# Patient Record
Sex: Female | Born: 1960
Health system: Southern US, Community
[De-identification: ages and names within clinical notes are randomized; demographics above are authoritative.]

## PROBLEM LIST (undated history)

## (undated) DIAGNOSIS — M329 Systemic lupus erythematosus, unspecified: Secondary | ICD-10-CM

## (undated) DIAGNOSIS — F419 Anxiety disorder, unspecified: Secondary | ICD-10-CM

## (undated) DIAGNOSIS — Z789 Other specified health status: Secondary | ICD-10-CM

## (undated) DIAGNOSIS — G629 Polyneuropathy, unspecified: Secondary | ICD-10-CM

## (undated) DIAGNOSIS — F410 Panic disorder [episodic paroxysmal anxiety] without agoraphobia: Secondary | ICD-10-CM

## (undated) DIAGNOSIS — Z8489 Family history of other specified conditions: Secondary | ICD-10-CM

## (undated) DIAGNOSIS — R0600 Dyspnea, unspecified: Secondary | ICD-10-CM

## (undated) DIAGNOSIS — F411 Generalized anxiety disorder: Secondary | ICD-10-CM

## (undated) DIAGNOSIS — N840 Polyp of corpus uteri: Secondary | ICD-10-CM

## (undated) DIAGNOSIS — G4733 Obstructive sleep apnea (adult) (pediatric): Secondary | ICD-10-CM

## (undated) DIAGNOSIS — C3490 Malignant neoplasm of unspecified part of unspecified bronchus or lung: Secondary | ICD-10-CM

## (undated) DIAGNOSIS — I10 Essential (primary) hypertension: Secondary | ICD-10-CM

## (undated) DIAGNOSIS — I499 Cardiac arrhythmia, unspecified: Secondary | ICD-10-CM

## (undated) DIAGNOSIS — I4510 Unspecified right bundle-branch block: Secondary | ICD-10-CM

## (undated) DIAGNOSIS — Z9989 Dependence on other enabling machines and devices: Secondary | ICD-10-CM

## (undated) DIAGNOSIS — M797 Fibromyalgia: Secondary | ICD-10-CM

## (undated) DIAGNOSIS — Z8659 Personal history of other mental and behavioral disorders: Secondary | ICD-10-CM

## (undated) DIAGNOSIS — M3500 Sicca syndrome, unspecified: Secondary | ICD-10-CM

## (undated) HISTORY — DX: Panic disorder (episodic paroxysmal anxiety): F41.0

## (undated) HISTORY — PX: LUNG SURGERY: SHX703

## (undated) HISTORY — DX: Anxiety disorder, unspecified: F41.9

## (undated) HISTORY — DX: Essential (primary) hypertension: I10

## (undated) HISTORY — DX: Sjogren syndrome, unspecified: M35.00

## (undated) HISTORY — PX: MOUTH SURGERY: SHX715

## (undated) HISTORY — DX: Malignant neoplasm of unspecified part of unspecified bronchus or lung: C34.90

## (undated) HISTORY — DX: Systemic lupus erythematosus, unspecified: M32.9

## (undated) HISTORY — PX: OTHER SURGICAL HISTORY: SHX169

---

## 2012-10-09 HISTORY — PX: COLONOSCOPY WITH PROPOFOL: SHX5780

## 2012-10-21 LAB — HM COLONOSCOPY

## 2017-07-20 ENCOUNTER — Encounter: Payer: Self-pay | Admitting: Obstetrics & Gynecology

## 2017-07-20 ENCOUNTER — Ambulatory Visit (INDEPENDENT_AMBULATORY_CARE_PROVIDER_SITE_OTHER): Payer: 59 | Admitting: Obstetrics & Gynecology

## 2017-07-20 VITALS — BP 130/80 | Ht 64.0 in | Wt 197.0 lb

## 2017-07-20 DIAGNOSIS — Z1151 Encounter for screening for human papillomavirus (HPV): Secondary | ICD-10-CM | POA: Diagnosis not present

## 2017-07-20 DIAGNOSIS — L930 Discoid lupus erythematosus: Secondary | ICD-10-CM | POA: Diagnosis not present

## 2017-07-20 DIAGNOSIS — M3500 Sicca syndrome, unspecified: Secondary | ICD-10-CM

## 2017-07-20 DIAGNOSIS — F419 Anxiety disorder, unspecified: Secondary | ICD-10-CM | POA: Diagnosis not present

## 2017-07-20 DIAGNOSIS — N951 Menopausal and female climacteric states: Secondary | ICD-10-CM | POA: Diagnosis not present

## 2017-07-20 DIAGNOSIS — Z01419 Encounter for gynecological examination (general) (routine) without abnormal findings: Secondary | ICD-10-CM

## 2017-07-20 MED ORDER — ALPRAZOLAM 0.25 MG PO TABS: 0.2500 mg | ORAL_TABLET | Freq: Two times a day (BID) | ORAL | 3 refills | Status: DC | PRN

## 2017-07-20 MED ORDER — PROGESTERONE MICRONIZED 100 MG PO CAPS: 100.0000 mg | ORAL_CAPSULE | Freq: Every day | ORAL | 4 refills | Status: DC

## 2017-07-20 NOTE — Progress Notes (Signed)
Patricia Mathews 1961/10/25 [id number]   History:    56 y.o. G2P2 Married.  Husband is a Engineer, drilling.  From [place].  Speaks both [place] and [place].  RP:  New patient presenting for annual gyn exam   HPI:  Menopause x about 7 years.  No HRT.  No PMB.  No pelvic pain.  Has hot flashes and night sweats associated with panic attacks.  Followed for Sjogren syndrome and Lupus.  On cymbalta and Lisinopril.  Breasts wnl.  Mictions/BMs wnl.  Past medical history,surgical history, family history and social history were all reviewed and documented in the EPIC chart.  Gynecologic History No LMP recorded. Patient is postmenopausal. Contraception: post menopausal status Last Pap: 2016. Results were: normal Last mammogram: 2016. Results were: Normal Colonoscopy 2 yrs ago. Bone Density to schedule.  Obstetric History OB History  Gravida Para Term Preterm AB Living  2 2       2   SAB TAB Ectopic Multiple Live Births               # Outcome Date GA Lbr Len/2nd Weight Sex Delivery Anes PTL Lv  2 Para           1 Para                ROS: A ROS was performed and pertinent positives and negatives are included in the history.  GENERAL: No fevers or chills. HEENT: No change in vision, no earache, sore throat or sinus congestion. NECK: No pain or stiffness. CARDIOVASCULAR: No chest pain or pressure. No palpitations. PULMONARY: No shortness of breath, cough or wheeze. GASTROINTESTINAL: No abdominal pain, nausea, vomiting or diarrhea, melena or bright red blood per rectum. GENITOURINARY: No urinary frequency, urgency, hesitancy or dysuria. MUSCULOSKELETAL: No joint or muscle pain, no back pain, no recent trauma. DERMATOLOGIC: No rash, no itching, no lesions. ENDOCRINE: No polyuria, polydipsia, no heat or cold intolerance. No recent change in weight. HEMATOLOGICAL: No anemia or easy bruising or bleeding. NEUROLOGIC: No headache, seizures, numbness, tingling or weakness. PSYCHIATRIC: No depression, no  loss of interest in normal activity or change in sleep pattern.     Exam:   BP 130/80   Ht 5\' 4"  (1.626 m)   Wt 197 lb (89.4 kg)   BMI 33.81 kg/m   Body mass index is 33.81 kg/m.  General appearance : Well developed well nourished female. No acute distress HEENT: Eyes: no retinal hemorrhage or exudates,  Neck supple, trachea midline, no carotid bruits, no thyroidmegaly Lungs: Clear to auscultation, no rhonchi or wheezes, or rib retractions  Heart: Regular rate and rhythm, no murmurs or gallops Breast:Examined in sitting and supine position were symmetrical in appearance, no palpable masses or tenderness,  no skin retraction, no nipple inversion, no nipple discharge, no skin discoloration, no axillary or supraclavicular lymphadenopathy Abdomen: no palpable masses or tenderness, no rebound or guarding Extremities: no edema or skin discoloration or tenderness  Pelvic: Vulva normal  Bartholin, Urethra, Skene Glands: Within normal limits             Vagina: No gross lesions or discharge  Cervix: No gross lesions or discharge.  Pap/HPV done.  Uterus  AV, normal size, shape and consistency, non-tender and mobile  Adnexa  Without masses or tenderness  Anus and perineum  normal     Assessment/Plan:  56 y.o. female for annual exam   1. Encounter for routine gynecological examination with Papanicolaou smear of cervix Normal gyn exam.  Pap/HPV done.  Breasts wnl.  Will schedule screening Mammo. - PAP,TP IMGw/HPV RNA,rflx [id number],62/83  2. Menopause syndrome No HRT.  No PMB.  C/O worsened anxiety associated with Hot Flushes and Night Sweats.  Given Lupus and Sjogren's as well as Menopausal status x many years without HRT, decision not to start Estrogen at this point because of the significant risk of Strokes.  Different management options reviewed.  Decision to try Prometrium 100 mg PO HS.  Usage reviewed.  3. Anxiety Counseling done.  Offered referral to Psychotherapist.  Prescribed  Xanax as needed, recommend to use sparingly.  Importance of regular Physical activity to improve anxiety by releasing Endorphins discussed.  4. Lupus erythematosus, unspecified form BP 130/80 on Medication.  5. Sjogren's syndrome, with unspecified organ involvement ([place])   6. Special screening examination for human papillomavirus (HPV)  - PAP,TP IMGw/HPV RNA,rflx [id number],65/03  Counseling on above issues >50% x 20 minutes.  Princess Bruins MD, 11:24 AM 07/20/2017

## 2017-07-21 MED ORDER — PROGESTERONE MICRONIZED 100 MG PO CAPS: 100.0000 mg | ORAL_CAPSULE | Freq: Every day | ORAL | 4 refills | Status: DC

## 2017-07-21 MED ORDER — DULOXETINE HCL 30 MG PO CPEP: 30.0000 mg | ORAL_CAPSULE | Freq: Every day | ORAL | 0 refills | Status: DC

## 2017-07-21 MED FILL — DULoxetine HCL 30 MG CPEP: 30 | 30 days supply | Qty: 30 | Fill #0

## 2017-07-22 LAB — PAP, TP IMAGING W/ HPV RNA, RFLX HPV TYPE 16,18/45: HPV DNA High Risk: NOT DETECTED

## 2017-07-22 MED ORDER — ALPRAZOLAM 0.25 MG PO TABS: 0.2500 mg | ORAL_TABLET | Freq: Two times a day (BID) | ORAL | 3 refills | Status: DC | PRN

## 2017-07-22 MED FILL — PROGESTERONE 100 MG CAPSULE: 100 | 90 days supply | Qty: 90 | Fill #0

## 2017-07-22 MED FILL — ALPRAZolam 0.25 MG TABS: 0.25 | 15 days supply | Qty: 30 | Fill #0

## 2017-07-22 NOTE — Patient Instructions (Signed)
1. Encounter for routine gynecological examination with Papanicolaou smear of cervix Normal gyn exam.  Pap/HPV done.  Breasts wnl.  Will schedule screening Mammo. - PAP,TP IMGw/HPV RNA,rflx [id number],85/88  2. Menopause syndrome No HRT.  No PMB.  C/O worsened anxiety associated with Hot Flushes and Night Sweats.  Given Lupus and Sjogren's as well as Menopausal status x many years without HRT, decision not to start Estrogen at this point because of the significant risk of Strokes.  Different management options reviewed.  Decision to try Prometrium 100 mg PO HS.  Usage reviewed.  3. Anxiety Counseling done.  Offered referral to Psychotherapist.  Prescribed Xanax as needed, recommend to use sparingly.  Importance of regular Physical activity to improve anxiety by releasing Endorphins discussed.  4. Lupus erythematosus, unspecified form BP 130/80 on Medication.  5. Sjogren's syndrome, with unspecified organ involvement (Patricia Mathews)   6. Special screening examination for human papillomavirus (HPV)  - PAP,TP IMGw/HPV RNA,rflx [id number],78/67  Patricia Mathews, it was a pleasure to meet you today!  I will inform you of your results as soon as available.   Health Maintenance for Postmenopausal Women Menopause is a normal process in which your reproductive ability comes to an end. This process happens gradually over a span of months to years, usually between the ages of 74 and 31. Menopause is complete when you have missed 12 consecutive menstrual periods. It is important to talk with your health care provider about some of the most common conditions that affect postmenopausal women, such as heart disease, cancer, and bone loss (osteoporosis). Adopting a healthy lifestyle and getting preventive care can help to promote your health and wellness. Those actions can also lower your chances of developing some of these common conditions. What should I know about menopause? During menopause, you may experience a number  of symptoms, such as:  Moderate-to-severe hot flashes.  Night sweats.  Decrease in sex drive.  Mood swings.  Headaches.  Tiredness.  Irritability.  Memory problems.  Insomnia.  Choosing to treat or not to treat menopausal changes is an individual decision that you make with your health care provider. What should I know about hormone replacement therapy and supplements? Hormone therapy products are effective for treating symptoms that are associated with menopause, such as hot flashes and night sweats. Hormone replacement carries certain risks, especially as you become older. If you are thinking about using estrogen or estrogen with progestin treatments, discuss the benefits and risks with your health care provider. What should I know about heart disease and stroke? Heart disease, heart attack, and stroke become more likely as you age. This may be due, in part, to the hormonal changes that your body experiences during menopause. These can affect how your body processes dietary fats, triglycerides, and cholesterol. Heart attack and stroke are both medical emergencies. There are many things that you can do to help prevent heart disease and stroke:  Have your blood pressure checked at least every 1-2 years. High blood pressure causes heart disease and increases the risk of stroke.  If you are 56-41 years old, ask your health care provider if you should take aspirin to prevent a heart attack or a stroke.  Do not use any tobacco products, including cigarettes, chewing tobacco, or electronic cigarettes. If you need help quitting, ask your health care provider.  It is important to eat a healthy diet and maintain a healthy weight. ? Be sure to include plenty of vegetables, fruits, low-fat dairy products, and lean protein. ? Avoid  eating foods that are high in solid fats, added sugars, or salt (sodium).  Get regular exercise. This is one of the most important things that you can do for  your health. ? Try to exercise for at least 150 minutes each week. The type of exercise that you do should increase your heart rate and make you sweat. This is known as moderate-intensity exercise. ? Try to do strengthening exercises at least twice each week. Do these in addition to the moderate-intensity exercise.  Know your numbers.Ask your health care provider to check your cholesterol and your blood glucose. Continue to have your blood tested as directed by your health care provider.  What should I know about cancer screening? There are several types of cancer. Take the following steps to reduce your risk and to catch any cancer development as early as possible. Breast Cancer  Practice breast self-awareness. ? This means understanding how your breasts normally appear and feel. ? It also means doing regular breast self-exams. Let your health care provider know about any changes, no matter how small.  If you are 26 or older, have a clinician do a breast exam (clinical breast exam or CBE) every year. Depending on your age, family history, and medical history, it may be recommended that you also have a yearly breast X-ray (mammogram).  If you have a family history of breast cancer, talk with your health care provider about genetic screening.  If you are at high risk for breast cancer, talk with your health care provider about having an MRI and a mammogram every year.  Breast cancer (BRCA) gene test is recommended for women who have family members with BRCA-related cancers. Results of the assessment will determine the need for genetic counseling and BRCA1 and for BRCA2 testing. BRCA-related cancers include these types: ? Breast. This occurs in males or females. ? Ovarian. ? Tubal. This may also be called fallopian tube cancer. ? Cancer of the abdominal or pelvic lining (peritoneal cancer). ? Prostate. ? Pancreatic.  Cervical, Uterine, and Ovarian Cancer Your health care provider may  recommend that you be screened regularly for cancer of the pelvic organs. These include your ovaries, uterus, and vagina. This screening involves a pelvic exam, which includes checking for microscopic changes to the surface of your cervix (Pap test).  For women ages 21-65, health care providers may recommend a pelvic exam and a Pap test every three years. For women ages 58-65, they may recommend the Pap test and pelvic exam, combined with testing for human papilloma virus (HPV), every five years. Some types of HPV increase your risk of cervical cancer. Testing for HPV may also be done on women of any age who have unclear Pap test results.  Other health care providers may not recommend any screening for nonpregnant women who are considered low risk for pelvic cancer and have no symptoms. Ask your health care provider if a screening pelvic exam is right for you.  If you have had past treatment for cervical cancer or a condition that could lead to cancer, you need Pap tests and screening for cancer for at least 20 years after your treatment. If Pap tests have been discontinued for you, your risk factors (such as having a new sexual partner) need to be reassessed to determine if you should start having screenings again. Some women have medical problems that increase the chance of getting cervical cancer. In these cases, your health care provider may recommend that you have screening and Pap tests  more often.  If you have a family history of uterine cancer or ovarian cancer, talk with your health care provider about genetic screening.  If you have vaginal bleeding after reaching menopause, tell your health care provider.  There are currently no reliable tests available to screen for ovarian cancer.  Lung Cancer Lung cancer screening is recommended for adults 41-71 years old who are at high risk for lung cancer because of a history of smoking. A yearly low-dose CT scan of the lungs is recommended if  you:  Currently smoke.  Have a history of at least 30 pack-years of smoking and you currently smoke or have quit within the past 15 years. A pack-year is smoking an average of one pack of cigarettes per day for one year.  Yearly screening should:  Continue until it has been 15 years since you quit.  Stop if you develop a health problem that would prevent you from having lung cancer treatment.  Colorectal Cancer  This type of cancer can be detected and can often be prevented.  Routine colorectal cancer screening usually begins at age 53 and continues through age 20.  If you have risk factors for colon cancer, your health care provider may recommend that you be screened at an earlier age.  If you have a family history of colorectal cancer, talk with your health care provider about genetic screening.  Your health care provider may also recommend using home test kits to check for hidden blood in your stool.  A small camera at the end of a tube can be used to examine your colon directly (sigmoidoscopy or colonoscopy). This is done to check for the earliest forms of colorectal cancer.  Direct examination of the colon should be repeated every 5-10 years until age 6. However, if early forms of precancerous polyps or small growths are found or if you have a family history or genetic risk for colorectal cancer, you may need to be screened more often.  Skin Cancer  Check your skin from head to toe regularly.  Monitor any moles. Be sure to tell your health care provider: ? About any new moles or changes in moles, especially if there is a change in a mole's shape or color. ? If you have a mole that is larger than the size of a pencil eraser.  If any of your family members has a history of skin cancer, especially at a young age, talk with your health care provider about genetic screening.  Always use sunscreen. Apply sunscreen liberally and repeatedly throughout the day.  Whenever you are  outside, protect yourself by wearing long sleeves, pants, a wide-brimmed hat, and sunglasses.  What should I know about osteoporosis? Osteoporosis is a condition in which bone destruction happens more quickly than new bone creation. After menopause, you may be at an increased risk for osteoporosis. To help prevent osteoporosis or the bone fractures that can happen because of osteoporosis, the following is recommended:  If you are 41-20 years old, get at least 1,000 mg of calcium and at least 600 mg of vitamin D per day.  If you are older than age 74 but younger than age 9, get at least 1,200 mg of calcium and at least 600 mg of vitamin D per day.  If you are older than age 41, get at least 1,200 mg of calcium and at least 800 mg of vitamin D per day.  Smoking and excessive alcohol intake increase the risk of osteoporosis. Eat foods  that are rich in calcium and vitamin D, and do weight-bearing exercises several times each week as directed by your health care provider. What should I know about how menopause affects my mental health? Depression may occur at any age, but it is more common as you become older. Common symptoms of depression include:  Low or sad mood.  Changes in sleep patterns.  Changes in appetite or eating patterns.  Feeling an overall lack of motivation or enjoyment of activities that you previously enjoyed.  Frequent crying spells.  Talk with your health care provider if you think that you are experiencing depression. What should I know about immunizations? It is important that you get and maintain your immunizations. These include:  Tetanus, diphtheria, and pertussis (Tdap) booster vaccine.  Influenza every year before the flu season begins.  Pneumonia vaccine.  Shingles vaccine.  Your health care provider may also recommend other immunizations. This information is not intended to replace advice given to you by your health care provider. Make sure you discuss  any questions you have with your health care provider. Document Released: 12/18/2005 Document Revised: 05/15/2016 Document Reviewed: 07/30/2015 Elsevier Interactive Patient Education  2018 [employer].

## 2017-07-30 ENCOUNTER — Telehealth: Payer: Self-pay | Admitting: *Deleted

## 2017-07-30 NOTE — Telephone Encounter (Signed)
Appointment 08/16/17 @ 9:45am with Arlyss Gandy at [facility] Dermatology notes faxed to [phone]. Left detailed message on pt voicemail per DPR access. , told her to call me to confirmed she receive my message.

## 2017-07-30 NOTE — Telephone Encounter (Signed)
-----   Message from Princess Bruins, MD sent at 07/20/2017 12:04 PM EDT ----- Regarding: Refer to Dermato 2 skin lesions and general skin exam.  Refer to Dermato.

## 2017-08-16 DIAGNOSIS — L720 Epidermal cyst: Secondary | ICD-10-CM | POA: Diagnosis not present

## 2017-08-16 DIAGNOSIS — D1800 Hemangioma unspecified site: Secondary | ICD-10-CM | POA: Diagnosis not present

## 2017-08-16 DIAGNOSIS — D229 Melanocytic nevi, unspecified: Secondary | ICD-10-CM | POA: Diagnosis not present

## 2017-09-16 MED FILL — ALPRAZolam 0.25 MG TABS: 0.25 | 15 days supply | Qty: 30 | Fill #1

## 2017-09-29 ENCOUNTER — Other Ambulatory Visit: Payer: Self-pay | Admitting: Emergency Medicine

## 2017-09-29 DIAGNOSIS — Z1231 Encounter for screening mammogram for malignant neoplasm of breast: Secondary | ICD-10-CM

## 2017-10-27 ENCOUNTER — Ambulatory Visit: Payer: Self-pay

## 2017-10-27 ENCOUNTER — Ambulatory Visit
Admission: RE | Admit: 2017-10-27 | Discharge: 2017-10-27 | Disposition: A | Discharge status: Home / Self Care | Payer: 59 | Source: Ambulatory Visit | Attending: Emergency Medicine | Admitting: Emergency Medicine

## 2017-10-27 DIAGNOSIS — Z1231 Encounter for screening mammogram for malignant neoplasm of breast: Secondary | ICD-10-CM

## 2017-11-11 MED FILL — ALPRAZolam 0.25 MG TABS: 0.25 | 15 days supply | Qty: 30 | Fill #2

## 2017-12-01 DIAGNOSIS — M3500 Sicca syndrome, unspecified: Secondary | ICD-10-CM | POA: Diagnosis not present

## 2017-12-01 DIAGNOSIS — M5432 Sciatica, left side: Secondary | ICD-10-CM | POA: Diagnosis not present

## 2017-12-01 DIAGNOSIS — Z6832 Body mass index (BMI) 32.0-32.9, adult: Secondary | ICD-10-CM | POA: Diagnosis not present

## 2017-12-01 DIAGNOSIS — R202 Paresthesia of skin: Secondary | ICD-10-CM | POA: Diagnosis not present

## 2017-12-01 DIAGNOSIS — M797 Fibromyalgia: Secondary | ICD-10-CM | POA: Diagnosis not present

## 2017-12-01 DIAGNOSIS — E669 Obesity, unspecified: Secondary | ICD-10-CM | POA: Diagnosis not present

## 2017-12-01 DIAGNOSIS — R5383 Other fatigue: Secondary | ICD-10-CM | POA: Diagnosis not present

## 2017-12-02 ENCOUNTER — Encounter: Payer: Self-pay | Admitting: Emergency Medicine

## 2017-12-08 ENCOUNTER — Encounter: Payer: Self-pay | Admitting: Neurology

## 2017-12-09 ENCOUNTER — Telehealth: Payer: Self-pay | Admitting: Neurology

## 2017-12-09 NOTE — Telephone Encounter (Signed)
Patricia Mathews, I am going to open up February 15th. Would you call patient and see if she would interested in that day and let me know? Then I will open up the day and put her in that slot. thanks

## 2017-12-23 ENCOUNTER — Encounter: Payer: Self-pay | Admitting: Neurology

## 2017-12-23 ENCOUNTER — Ambulatory Visit (INDEPENDENT_AMBULATORY_CARE_PROVIDER_SITE_OTHER): Payer: 59 | Admitting: Neurology

## 2017-12-23 VITALS — BP 143/82 | HR 84 | Ht 65.0 in | Wt 192.0 lb

## 2017-12-23 DIAGNOSIS — G3281 Cerebellar ataxia in diseases classified elsewhere: Secondary | ICD-10-CM

## 2017-12-23 DIAGNOSIS — G4719 Other hypersomnia: Secondary | ICD-10-CM

## 2017-12-23 DIAGNOSIS — R531 Weakness: Secondary | ICD-10-CM

## 2017-12-23 DIAGNOSIS — G629 Polyneuropathy, unspecified: Secondary | ICD-10-CM

## 2017-12-23 DIAGNOSIS — R202 Paresthesia of skin: Secondary | ICD-10-CM | POA: Diagnosis not present

## 2017-12-23 DIAGNOSIS — R29818 Other symptoms and signs involving the nervous system: Secondary | ICD-10-CM

## 2017-12-23 DIAGNOSIS — R2990 Unspecified symptoms and signs involving the nervous system: Secondary | ICD-10-CM | POA: Diagnosis not present

## 2017-12-23 DIAGNOSIS — R519 Headache, unspecified: Secondary | ICD-10-CM

## 2017-12-23 DIAGNOSIS — R5382 Chronic fatigue, unspecified: Secondary | ICD-10-CM | POA: Diagnosis not present

## 2017-12-23 DIAGNOSIS — R42 Dizziness and giddiness: Secondary | ICD-10-CM | POA: Diagnosis not present

## 2017-12-23 DIAGNOSIS — R51 Headache: Secondary | ICD-10-CM

## 2017-12-23 NOTE — Progress Notes (Signed)
[facility]    Provider:  Dr Jaynee Eagles Referring Provider: Horald Pollen, * Primary Care Physician:  Horald Pollen, MD  CC:  Neuropathy  HPI:  Patricia Mathews is a 57 y.o. female here as a referral from Dr. Mitchel Honour for neuropathy.  Past medical history systemic lupus, Sjogren's, high blood pressure, postmenopausal symptoms, anxiety.  She has a significant family history of autoimmune disorders.  She is also obese, fibromyalgia, paresthesias, other fatigue, and sciatica of the left leg. Symptoms all started in 2010 around menopause, with hair loss, joint pain, muscle pain, malaise, fatigue,  she went to a neurologist in [place], She imaged her neuroaxis and had an emg/ncs were unremarkable. She saw Rheumatology, ANA was positive and +Sjogren's diagnosed with lupus and sjogrens but unsure may be more Fibromyalgia. She has electrical pain traveling down her spine, radiates to the arms and thighs (points to the anterior thighs) like stabbing pain, excruciating. Startes in the neck and radiates down her back, she fell to the floor and couldn't move for 45 minutes, she was on the floor. She couldn't move at all. She has headaches and vertigo with nystagmus. Lasts 60 seconds. She has a lot of vertigo, very bad headaches and stabbing pain which is horrible. There has been extensive workup over the years. The shooting pain down the spine happens weekly and is severe and lasts 10-15 minutes and radiates down the spine. No triggers. She is having vertigo more often, her eyes start "going crazy", Can occur with or without a headache. She has headaches a few times a week. Start n the left side, she has photophobia, phono/osmophobia, nausea, moving makes it worse, tramadol helps, can last 4-24 hours, naysea and vomiting. No Fhx of migraines. 18 headache days a month. Husband says she snores significantly. She has had "drop attacks" per her physician husband, she has extreme fatigue  and malaise, snores. She has vision changes, she sees little dots. She has acute severe onset occipital severe headache.  Reviewed notes, labs and imaging from outside physicians, which showed: Reviewed primary care notes and notes from [facility] rheumatology.  Patient is being treated.  CK was normal at 111, CBC normal, sed rate normal, CMP normal with BUN 10 and creatinine 0.75 labs were drawn December 03, 2017.  For systemic lupus.  She has pain and tenderness of her muscles and joints.  She has fatigue and hair thinning.  She went into menopause which made everything worse.  Fatigue was worse.  In 2012 she had an episode where she had severe pain of the spinal cord with lasted 2 days, she had normal MRIs of her brain and cervical spine, in 2013 she saw rheumatology again she was having diffuse tenderness of the muscles, stabbing pain down the legs, she was given Plaquenil which did not seem safe due to side effects.  Prednisone taper makes her migraines worse.  She decided to due to American natural things instead.  She takes a low-dose estrogen with helps with hot flashes and helps her sleep.  She also has anxiety and takes Cymbalta for her pain which helps but does not help with anxiety.  Tenderness is pretty much gone with the Cymbalta.  She has flares where she feels like she has an electricity shock pain that goes to her body and notes that this started since November however the family says this is been ongoing for the last 3 years and even before the Cymbalta.  She notes pain in her  shoulders, elbows, knees, pain down the left leg.  She notes no joint swelling.  No stiffness in the morning, does not feel worse in the morning.  She feels better when she exercises.  She notes dry eyes and mouth, uses drops only occasionally, she uses biotin mouthwash.      Review of Systems: Patient complains of symptoms per HPI as well as the following symptoms: neuropathy, joint pain, muscle pain, fatigue.  Pertinent negatives and positives per HPI. All others negative.   Social History   Socioeconomic History  . Marital status: Married    Spouse name: Not on file  . Number of children: 2  . Years of education: post-grad  . Highest education level: Not on file  Social Needs  . Financial resource strain: Not on file  . Food insecurity - worry: Not on file  . Food insecurity - inability: Not on file  . Transportation needs - medical: Not on file  . Transportation needs - non-medical: Not on file  Occupational History  . Not on file  Tobacco Use  . Smoking status: Former Smoker    Last attempt to quit: 1989    Years since quitting: 30.1  . Smokeless tobacco: Never Used  Substance and Sexual Activity  . Alcohol use: Yes    Comment: social  . Drug use: No  . Sexual activity: Yes    Partners: Male    Comment: 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45, PARTNERS- 2, MARRIED- 7 YRS   Other Topics Concern  . Not on file  Social History Narrative   Lives at home with her husband   Right handed   2 cups of caffeine daily    Family History  Problem Relation Age of Onset  . Hypertension Mother   . Osteoarthritis Mother   . Cancer Father        PROSTATE  . Hypertension Father   . Hypertension Sister   . Lupus Sister   . Cancer Brother        PROSTATE  . Lupus Brother   . Hypertension Brother   . Sarcoidosis Paternal Aunt   . Hypertension Brother   . Hypertension Brother   . Breast cancer Neg Hx     Past Medical History:  Diagnosis Date  . Anxiety   . Hypertension   . Lupus   . Panic attacks   . Sjogren's disease [facility])     Past Surgical History:  Procedure Laterality Date  . CESAREAN SECTION     X2  . MOUTH SURGERY      Current Outpatient Medications  Medication Sig Dispense Refill  . ALPRAZolam (XANAX) 0.25 MG tablet Take 1 tablet (0.25 mg total) by mouth 2 (two) times daily as needed for anxiety. 30 tablet 3  . DULoxetine (CYMBALTA) 30 MG capsule Take 1 capsule (30 mg total) by  mouth daily. (Patient taking differently: Take 30 mg by mouth 2 (two) times daily. ) 30 capsule 0  . lisinopril (PRINIVIL,ZESTRIL) 10 MG tablet Take 20 mg by mouth daily.     . progesterone (PROMETRIUM) 100 MG capsule Take 1 capsule (100 mg total) by mouth at bedtime. 90 capsule 4  . ALPRAZolam (XANAX) 0.5 MG tablet Take 1-2 tabs 30-60 minutes before MRI. May repeat if needed for anxiety during procedure. Max 63m a day. 10 tablet 0   No current facility-administered medications for this visit.     Allergies as of 12/23/2017 - Review Complete 12/23/2017  Allergen Reaction Noted  . Penicillins Anaphylaxis  07/20/2017    Vitals: BP (!) 143/82 (BP Location: Left Arm, Patient Position: Sitting) Comment: pt anxious; BP same at this AM per husband  Pulse 84   Ht _0  (1.651 m)   Wt 192 lb (87.1 kg)   BMI 31.95 kg/m  Last Weight:  Wt Readings from Last 1 Encounters:  12/23/17 192 lb (87.1 kg)   Last Height:   Ht Readings from Last 1 Encounters:  12/23/17 _1  (1.651 m)   Physical exam: Exam: Gen: NAD, conversant, well nourised, obese, well groomed                     CV: RRR, no MRG. No Carotid Bruits. No peripheral edema, warm, nontender Eyes: Conjunctivae clear without exudates or hemorrhage  Neuro: Detailed Neurologic Exam  Speech:    Speech is normal; fluent and spontaneous with normal comprehension.  Cognition:    The patient is oriented to person, place, and time;     recent and remote memory intact;     language fluent;     normal attention, concentration,     fund of knowledge Cranial Nerves:    The pupils are equal, round, and reactive to light.Attempted fundoscopic exam could not visualize.. Visual fields are full to finger confrontation. Extraocular movements are intact. Trigeminal sensation is intact and the muscles of mastication are normal. The face is symmetric. The palate elevates in the midline. Hearing intact. Voice is normal. Shoulder shrug is normal. The  tongue has normal motion without fasciculations.   Coordination:    Normal finger to nose and heel to shin. Normal rapid alternating movements.   Gait:    Heel-toe and tandem gait are normal.   Motor Observation:    No asymmetry, no atrophy, and no involuntary movements noted. Tone:    Normal muscle tone.    Posture:    Posture is normal. normal erect    Strength: weak grip otherwise strength is V/V in the upper and lower limbs.      Sensation: intact vibration, impaired pin prick and temp in the distal LE     Reflex Exam:  DTR's:    Deep tendon reflexes in the upper and lower extremities are brisk bilaterally.   Toes:    The toes are downgoing bilaterally.   Clonus:    Clonus is absent.       Assessment/Plan:  57 year old female here for evaluation of multiple neurologic symptoms. Past medical history systemic lupus, Sjogren's, high blood pressure, postmenopausal symptoms, anxiety.  She has a significant family history of autoimmune disorders.  She is also obese, fibromyalgia, paresthesias, other fatigue, and sciatica of the left leg. She has a constellation of symptoms including severe fatigue, paresthesias, electrical jolts down her spine (Lheurmitte?), excruciating radicular symptoms, weakness, headaches, vertigo, nystagmis, snoring, "drop attacks", and other signs and symptoms.   Sleep evaluation Dr. Brett Fairy MRI brain w/wo contrast and cervical spine to evaluate for multiple sclerosis given her ataxia, neuropathy, weakness, Lheurmitte's sign She has had extensive lab workup, will order several more she may not have had Then Thoracic spine if above negative  Emg/ncs 4-limb study please CTA head after above workup if unremarkable to eval for vasculitis or other vascular etiologies   Orders Placed This Encounter  Procedures  . MR BRAIN W WO CONTRAST  . MR CERVICAL SPINE W WO CONTRAST  . Hemoglobin A1c  . B12 and Folate Panel  . Methylmalonic acid, serum  . Vitamin  B6  .  Multiple Myeloma Panel (SPEP&IFE w/QIG)  . Vitamin B1  . Ambulatory referral to Sleep Studies  . NCV with EMG(electromyography)    Sarina Ill, MD  [facility] [address] [place], [place]  [address]  Phone [phone] Fax [phone]

## 2017-12-23 NOTE — Patient Instructions (Addendum)
Sleep evaluation Dr. Brett Fairy MRI brain, cervical spine and thoracic spine w/wo contrast Then may consider Emg/ncs and CTA head Consider vestibular migraines or vertigo associated with migraines Labs

## 2017-12-27 ENCOUNTER — Telehealth: Payer: Self-pay | Admitting: Neurology

## 2017-12-27 ENCOUNTER — Other Ambulatory Visit: Payer: Self-pay | Admitting: Neurology

## 2017-12-27 ENCOUNTER — Telehealth: Payer: Self-pay | Admitting: *Deleted

## 2017-12-27 DIAGNOSIS — G629 Polyneuropathy, unspecified: Secondary | ICD-10-CM | POA: Diagnosis not present

## 2017-12-27 MED ORDER — ALPRAZOLAM 0.5 MG PO TABS: ORAL_TABLET | ORAL | 0 refills | Status: DC

## 2017-12-27 MED FILL — ALPRAZolam 0.5 MG TABS: 0.5 | 2 days supply | Qty: 10 | Fill #0

## 2017-12-27 NOTE — Telephone Encounter (Signed)
Faxed requisitions to patient service center for the following labs: Vitamin B1, Multiple Myeloma Panel, Vitamin B6, MMA, Vitamin B12 & Folate Panel, and Hemoglobin A1c. Received a receipt of confirmation.

## 2017-12-27 NOTE — Telephone Encounter (Signed)
On your desk

## 2017-12-27 NOTE — Telephone Encounter (Signed)
Patricia Mathews, script left on your desk thanks

## 2017-12-27 NOTE — Telephone Encounter (Signed)
Left detailed voicemail on pharmacy's machine. Informed them of need to cancel Xanax prescription per pt request. Included pt's name and DOB and office call back number for any questions.

## 2017-12-27 NOTE — Telephone Encounter (Signed)
Pt called stating she doesn't want an Xanax called in since she will be driving to her MRI

## 2017-12-27 NOTE — Telephone Encounter (Signed)
Patient is schedule to have her MRI's done this Wednesday 12/29/17 at our [place] mobile unit. She informed me she is claustrophobic and would like something to calm her nerves.

## 2017-12-27 NOTE — Telephone Encounter (Signed)
Faxed prescription to pharmacy. Received a receipt of confirmation.

## 2017-12-27 NOTE — Telephone Encounter (Signed)
Spoke with patient. She is aware that Xanax prescription has been sent to her pharmacy with the following instructions:  Take 1-2 tabs 30-60 minutes before MRI. May repeat if needed for anxiety during procedure. Max 4 mg a day.  Patient verbalized understanding. I also advised patient not to drive while taking this medication. She verbalized understanding and stated that she has not decided if she is going to drive or not but will not take the medication if she does not drive. Advised not to fill medication if she does not need it for MRI. She agreed and had no additional questions.

## 2017-12-28 DIAGNOSIS — R2990 Unspecified symptoms and signs involving the nervous system: Secondary | ICD-10-CM | POA: Insufficient documentation

## 2017-12-29 ENCOUNTER — Other Ambulatory Visit: Payer: 59

## 2017-12-29 ENCOUNTER — Ambulatory Visit: Payer: 59

## 2017-12-29 DIAGNOSIS — G4719 Other hypersomnia: Secondary | ICD-10-CM

## 2017-12-29 DIAGNOSIS — R29818 Other symptoms and signs involving the nervous system: Secondary | ICD-10-CM

## 2017-12-29 DIAGNOSIS — R519 Headache, unspecified: Secondary | ICD-10-CM

## 2017-12-29 DIAGNOSIS — G629 Polyneuropathy, unspecified: Secondary | ICD-10-CM | POA: Diagnosis not present

## 2017-12-29 DIAGNOSIS — R51 Headache: Secondary | ICD-10-CM

## 2017-12-29 DIAGNOSIS — R42 Dizziness and giddiness: Secondary | ICD-10-CM | POA: Diagnosis not present

## 2017-12-29 DIAGNOSIS — R2990 Unspecified symptoms and signs involving the nervous system: Secondary | ICD-10-CM

## 2017-12-29 DIAGNOSIS — R5382 Chronic fatigue, unspecified: Secondary | ICD-10-CM

## 2017-12-29 DIAGNOSIS — G3281 Cerebellar ataxia in diseases classified elsewhere: Secondary | ICD-10-CM

## 2017-12-29 DIAGNOSIS — R531 Weakness: Secondary | ICD-10-CM

## 2017-12-29 DIAGNOSIS — R202 Paresthesia of skin: Secondary | ICD-10-CM | POA: Diagnosis not present

## 2017-12-29 MED ORDER — GADOPENTETATE DIMEGLUMINE 469.01 MG/ML IV SOLN: 20.0000 mL | Freq: Once | INTRAVENOUS | Status: DC | PRN

## 2017-12-30 ENCOUNTER — Encounter: Payer: Self-pay | Admitting: Emergency Medicine

## 2017-12-31 ENCOUNTER — Telehealth: Payer: Self-pay | Admitting: Neurology

## 2017-12-31 NOTE — Telephone Encounter (Signed)
-----   Message from Melvenia Beam, MD sent at 12/30/2017  5:05 PM EST ----- Normal MRI brain and cervical spine. Incidental posterior fossa arachnoid cyst (4.7 x 2.4cm on axial views).

## 2017-12-31 NOTE — Telephone Encounter (Signed)
Called and made her aware that everything was normal. Pt verbalized understanding and was appreciative for the call.

## 2018-01-03 ENCOUNTER — Encounter: Payer: Self-pay | Admitting: Neurology

## 2018-01-03 ENCOUNTER — Ambulatory Visit (INDEPENDENT_AMBULATORY_CARE_PROVIDER_SITE_OTHER): Payer: 59 | Admitting: Neurology

## 2018-01-03 VITALS — BP 125/80 | HR 76 | Ht 65.0 in | Wt 194.0 lb

## 2018-01-03 DIAGNOSIS — G4710 Hypersomnia, unspecified: Secondary | ICD-10-CM | POA: Diagnosis not present

## 2018-01-03 DIAGNOSIS — R51 Headache: Secondary | ICD-10-CM

## 2018-01-03 DIAGNOSIS — R0683 Snoring: Secondary | ICD-10-CM | POA: Diagnosis not present

## 2018-01-03 DIAGNOSIS — G4730 Sleep apnea, unspecified: Secondary | ICD-10-CM | POA: Diagnosis not present

## 2018-01-03 DIAGNOSIS — R519 Headache, unspecified: Secondary | ICD-10-CM | POA: Insufficient documentation

## 2018-01-03 DIAGNOSIS — M2550 Pain in unspecified joint: Secondary | ICD-10-CM | POA: Insufficient documentation

## 2018-01-03 NOTE — Progress Notes (Signed)
[facility]INE CL[facility]IC   Provider:  Larey Seat, M D  Primary Care Physician:  Horald Pollen, MD   Referring Provider: Horald Pollen, *   Chief Complaint  Patient presents with  . New Patient (Initial Visit)    pt alone, rm 10. pt states that she is here to rule out sleep apnea. she is told she snores in sleep.     HPI:  Patricia Mathews is a 57 y.o. female, seen here as in a referral from Dr. Jaynee Eagles for an evaluation of snoring and possible sleep apnea.  The patient sees my colleague Dr. Jaynee Eagles, who is working her up for headaches with vertigo and with established nystagmus.  She also has a stabbing pain sensation.  Is feeling pain that radiates down her spine and occurs weekly.  She has not identified triggers.  Her vertigo can also occur with and without a headache.  There is no family history of migraines.  She has noted up to 18 headache days a month.  Her husband, who is trained as an emergency room physician, has stated that she snores significantly.  She has also suffered from extreme fatigue, sleepiness, malaise and she has had drop attacks.  She was diagnosed with lupus and Sjogren's syndrome over 10 years ago while living in [place].  She moved from [place], [place] about a year ago to the [place] area with her MD husband , who is practicing as a family Loss adjuster, chartered.  She could not tolerate Plaquenil been first treated, prednisone Tapers have made her migraines worse, but she uses low-dose estrogen which helps hot flashes and helps her sleep.  She had felt some relief with Cymbalta.  She also noted dry eyes and dry mouth- but this does not necessarily feel worse in the morning.  Chief complaint according to patient :" I may have apnea".   Sleep habits are as follows: Patricia Mathews reports that she may stay up watching TV until her bedtime around 10 PM if her husband goes to work next day.  If he does not have to go to work next day may actually watch Netflix up  to 1 AM. She has no TV in the bedroom- she watches in her den.  Once in bed, she prays and goes promptly to sleep.  The patient's bedroom is cool, quiet and dark.  She has a bedroom with her husband, prefers to sleep on her right side and only uses one pillow.  The mattress is flat in a nonadjustable bed.  She usually is able to sleep through for several hours before she wakes,   often she does not even have a bathroom break at night. If she doesn't set her alarm and has not to get up in the morning she has no difficulties to sleep until 10 or 11 AM the next day.  She craves more sleep, wakes up achy but not tired.  Unfortunately she often has headaches in the morning this can be down the neck radiating sharp stabbing sensations or throbbing global headaches.  She usually is not nauseated, not dizzy.  Some headaches wake her out of sleep, sharp stabbing, migraine or cluster type.    Sleep medical history and family sleep history: no neck injury, normal cervical spine MRI. Brain MRI.  Brother with Sjoegrens, lupus and , 2 brothers and father with cancer ( prostate). Son had uveitis once while still in high school.    Social history: married, 2 adult children with grandchildren,  She has been smoking until 27 years ago, 1992, while her daughter was in preschool.  Drinks wine, or whisky sour. 1-2 a week. Caffeine : coffee in AM and noon- 6  espressi doppio ! Soda - rarely,  Some times iced tea, no energy drinks.    Review of Systems: Out of a complete 14 system review, the patient complains of only the following symptoms, and all other reviewed systems are negative.  Snoring, headaches of several qualities, including sleep related, achiness, joint pain, less muscle pain. Hypersomnia   Epworth score 9/ 24   , Fatigue severity score 26/ 63   , depression score 2/ 15    Social History   Socioeconomic History  . Marital status: Married    Spouse name: Not on file  . Number of children: 2  . Years  of education: post-grad  . Highest education level: Not on file  Social Needs  . Financial resource strain: Not on file  . Food insecurity - worry: Not on file  . Food insecurity - inability: Not on file  . Transportation needs - medical: Not on file  . Transportation needs - non-medical: Not on file  Occupational History  . Not on file  Tobacco Use  . Smoking status: Former Smoker    Last attempt to quit: 1989    Years since quitting: 30.1  . Smokeless tobacco: Never Used  Substance and Sexual Activity  . Alcohol use: Yes    Comment: social  . Drug use: No  . Sexual activity: Yes    Partners: Male    Comment: 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24, PARTNERS- 2, MARRIED- 43 YRS   Other Topics Concern  . Not on file  Social History Narrative   Lives at home with her husband   Right handed   2 cups of caffeine daily    Family History  Problem Relation Age of Onset  . Hypertension Mother   . Osteoarthritis Mother   . Cancer Father        PROSTATE  . Hypertension Father   . Hypertension Sister   . Lupus Sister   . Cancer Brother        PROSTATE  . Lupus Brother   . Hypertension Brother   . Sarcoidosis Paternal Aunt   . Hypertension Brother   . Hypertension Brother   . Breast cancer Neg Hx     Past Medical History:  Diagnosis Date  . Anxiety   . Hypertension   . Lupus   . Panic attacks   . Sjogren's disease [facility])     Past Surgical History:  Procedure Laterality Date  . CESAREAN SECTION     X2  . MOUTH SURGERY      Current Outpatient Medications  Medication Sig Dispense Refill  . ALPRAZolam (XANAX) 0.25 MG tablet Take 1 tablet (0.25 mg total) by mouth 2 (two) times daily as needed for anxiety. 30 tablet 3  . DULoxetine (CYMBALTA) 30 MG capsule Take 1 capsule (30 mg total) by mouth daily. (Patient taking differently: Take 30 mg by mouth 2 (two) times daily. ) 30 capsule 0  . lisinopril (PRINIVIL,ZESTRIL) 10 MG tablet Take 20 mg by mouth daily.     . progesterone  (PROMETRIUM) 100 MG capsule Take 1 capsule (100 mg total) by mouth at bedtime. 90 capsule 4   No current facility-administered medications for this visit.    Facility-Administered Medications Ordered in Other Visits  Medication Dose Route Frequency Provider Last Rate Last Dose  .  gadopentetate dimeglumine (MAGNEVIST) injection 20 mL  20 mL Intravenous Once PRN Melvenia Beam, MD        Allergies as of 01/03/2018 - Review Complete 01/03/2018  Allergen Reaction Noted  . Penicillins Anaphylaxis 07/20/2017    Vitals: BP 125/80   Pulse 76   Ht 5\' 5"  (1.651 m)   Wt 194 lb (88 kg)   BMI 32.28 kg/m  Last Weight:  Wt Readings from Last 1 Encounters:  01/03/18 194 lb (88 kg)   WEX:HBZJ mass index is 32.28 kg/m.     Last Height:   Ht Readings from Last 1 Encounters:  01/03/18 5\' 5"  (1.651 m)    Physical exam:  General: The patient is awake, alert and appears not in acute distress. The patient is well groomed. Head: Normocephalic, atraumatic. Neck is supple. Mallampati 3,  neck circumference:14.5 . Nasal airflow patent , a TMJ click is not evident . Retrognathia is not seen.  All biological teeth and implant.  Cardiovascular:  Regular rate and rhythm , without  murmurs or carotid bruit, and without distended neck veins. Respiratory: Lungs are clear to auscultation. Skin:  Without evidence of edema, or rash Trunk: BMI is 32. The patient's posture is erect   Neurologic exam : The patient is awake and alert, oriented to place and time.   Memory subjective described as intact.  Attention span & concentration ability appears normal.  Speech is fluent,  without dysarthria, dysphonia or aphasia.  Mood and affect are appropriate.  Cranial nerves: Pupils are equal and briskly reactive to light. Funduscopic exam without evidence of pallor or edema.  Extraocular movements  in vertical and horizontal planes intact and without nystagmus. Visual fields by finger perimetry are  intact. Hearing to finger rub intact.   Facial sensation intact to fine touch.  Facial motor strength is symmetric and tongue and uvula move midline. Shoulder shrug was symmetrical.   Motor exam:  Normal tone, muscle bulk and symmetric strength in all extremities. Sensory:  Fine touch, pinprick and vibration were tested in all extremities and  normal. Coordination: Rapid alternating movements /Finger-to-nose maneuver  normal without evidence of ataxia, dysmetria or tremor. Gait and station: Patient walks without assistive device and is able unassisted to climb up to the exam table. Strength within normal limits.Stance is stable and normal. Turns with 3  Steps. Romberg testing is negative. Deep tendon reflexes: in the  upper and lower extremities are symmetric and intact. Babinski maneuver response is  downgoing.  Assessment:  After physical and neurologic examination, review of laboratory studies,  Personal review of imaging studies, reports of other /same  Imaging studies, results of polysomnography and / or neurophysiology testing and pre-existing records as far as provided in visit., my assessment is:   1) Patricia Mathews has experienced hypersomnia for several years now.  There were some interruptions in her sleep that were attributed to menopausal syndrome, but these have much improved on the hormone replacement.  However there is a lot of pain and achiness and soreness that  also affects how well she sleeps.  And she states that once she is asleep she is trying to sleep as long as she can to escape from hurting.  To sleep on her right sides but during the night shifts to supine or left sided sleep position, and her husband has noted her to snore, and has witnessed apneas.    These may contribute to hypersomnia.  I will order an attended sleep study for the  patient with a split-night procedure.  Should she have more than 20 apneas per hour in the first 2 hours of sleep we will try to treat her  either with CPAP or if no hypoxemia is present, we may later referral for a dental device..  Patricia Mathews is familiar with CPAP, as her husband is using a machine.  And she is not apprehensive about it.  I have reviewed her rheumatology records and Dr. Cathren Laine records of previous EMG, nerve conduction study, MRI results ANA remains positive and she also tested positive for the Sjogren's, however it may be possible that she would have more restful and restorative sleep and eliminate some of the sleep related headaches by using CPAP.  I will the patient once a sleep study has been completed.  The patient was advised of the nature of the diagnosed disorder , the treatment options and the  risks for general health and wellness arising from not treating the condition.   I spent more than 45  minutes of face to face time with the patient. Greater than 50% of time was spent in counseling and coordination of care. We have discussed the diagnosis and differential and I answered the patient's questions.    Plan:  Treatment plan and additional workup :  SPLIT night PSG- AHI 20 , look at possible hypoxemia    Larey Seat, MD 12/21/2480, 50:03 AM  Certified in Neurology by ABPN Certified in [place] by [facility] [address], [place], [place] [address]

## 2018-01-06 ENCOUNTER — Ambulatory Visit (INDEPENDENT_AMBULATORY_CARE_PROVIDER_SITE_OTHER): Payer: Self-pay | Admitting: Neurology

## 2018-01-06 ENCOUNTER — Ambulatory Visit (INDEPENDENT_AMBULATORY_CARE_PROVIDER_SITE_OTHER): Payer: 59 | Admitting: Neurology

## 2018-01-06 ENCOUNTER — Telehealth: Payer: Self-pay | Admitting: Neurology

## 2018-01-06 DIAGNOSIS — G4719 Other hypersomnia: Secondary | ICD-10-CM

## 2018-01-06 DIAGNOSIS — R51 Headache: Secondary | ICD-10-CM

## 2018-01-06 DIAGNOSIS — R42 Dizziness and giddiness: Secondary | ICD-10-CM

## 2018-01-06 DIAGNOSIS — R531 Weakness: Secondary | ICD-10-CM

## 2018-01-06 DIAGNOSIS — R519 Headache, unspecified: Secondary | ICD-10-CM

## 2018-01-06 DIAGNOSIS — G3281 Cerebellar ataxia in diseases classified elsewhere: Secondary | ICD-10-CM

## 2018-01-06 DIAGNOSIS — R202 Paresthesia of skin: Secondary | ICD-10-CM

## 2018-01-06 DIAGNOSIS — E8581 Light chain (AL) amyloidosis: Secondary | ICD-10-CM

## 2018-01-06 DIAGNOSIS — R29818 Other symptoms and signs involving the nervous system: Secondary | ICD-10-CM

## 2018-01-06 DIAGNOSIS — R5382 Chronic fatigue, unspecified: Secondary | ICD-10-CM

## 2018-01-06 DIAGNOSIS — R2990 Unspecified symptoms and signs involving the nervous system: Secondary | ICD-10-CM

## 2018-01-06 DIAGNOSIS — Z0289 Encounter for other administrative examinations: Secondary | ICD-10-CM

## 2018-01-06 DIAGNOSIS — G629 Polyneuropathy, unspecified: Secondary | ICD-10-CM

## 2018-01-06 NOTE — Progress Notes (Signed)
Full Name: Patricia Mathews Gender: Female MRN #: [id number] Date of Birth: Jan 12, 2061    Visit Date: 01/06/18 09:06 Age: 57 Years 16 Months Old Examining Physician: Sarina Ill, MD  Referring Physician: Dr. Brett Fairy, MD    History:  57 year old female here for evaluation of multiple neurologic symptoms. Past medical history systemic lupus, Sjogren's,high blood pressure, postmenopausal symptoms, anxiety. She has a significant family history of autoimmune disorders. She is also obese, fibromyalgia, paresthesias, other fatigue, and sciatica of the left leg. She has a constellation of symptoms including severe fatigue, paresthesias, electrical jolts down her spine (Lheurmitte?), excruciating radicular symptoms, weakness, headaches, vertigo, nystagmis, snoring, "drop attacks", and other signs and symptoms. Extensive labwork, neuroimaging to date unrevealing. A sleep study has been ordered and pending. Evaluation today for peripheral nerve or muscle disease.   Summary: EMG/NCS was performed on the bilateral lower extremities and left upper extremity.  The left median/ulnar (palm) comparison nerve showed prolonged distal peak latency (Median Palm, 2.2 ms, N<2.2) and abnormal peak latency difference (Median Palm-Ulnar Palm, 0.5 ms, N<0.4) with a relative median delay.   All remaining nerves were within normal limits All muscles were within normal limits   Conclusion: Possible mild left Carpal Tunnel Syndrome. Otherwise this is a normal exam.  Discussed. Discussed findings with patient and husband, reviewed images and answered questions. MRI brain and cervical spine unremarkable. Discussed upcoming sleep evaluation. Also following with rheumatology. Reviewed labs with patient. Reviewed labs: She has mild B12 deficient (B12 393 and elevated MMA), also Kappa light chain spike on IFE needs referral to hematology to rule out amyloidosis. Recommend daily oral B12.  A total of 40 minutes was  spent in with this patient and husband face-to-face. Over half this time was spent on counseling patient on the b12 deficiency and abnormal IFE, reviewing images with patient and husband and different differential and diagnostic options available. This does not include time spent performing procedure. Referral to Hematology.  Sarina Ill, M.D.  [facility] [place], [place] [address] Tel: [phone] Fax: [phone]        [facility]    Nerve / Sites Muscle Latency Ref. Amplitude Ref. Rel Amp Segments Distance Velocity Ref. Area    ms ms mV mV %  cm m/s m/s mVms  L Median - APB     Wrist APB 3.4 ?4.4 5.7 ?4.0 100 Wrist - APB 7   16.6     Upper arm APB 7.0  5.6  98 Upper arm - Wrist 20 56 ?49 18.5  L Ulnar - ADM     Wrist ADM 2.4 ?3.3 6.2 ?6.0 100 Wrist - ADM 7   16.7     B.Elbow ADM 5.5  5.7  91.8 B.Elbow - Wrist 18 60 ?49 16.6     A.Elbow ADM 7.2  5.7  100 A.Elbow - B.Elbow 10 58 ?49 16.5         A.Elbow - Wrist      R Peroneal - EDB     Ankle EDB 6.3 ?6.5 3.9 ?2.0 100 Ankle - EDB 9   13.6     Fib head EDB 12.3  3.5  91 Fib head - Ankle 30 49 ?44 13.7     Pop fossa EDB 14.3  3.4  96.6 Pop fossa - Fib head 10 52 ?44 13.6         Pop fossa - Ankle      L Peroneal - EDB  Ankle EDB 6.1 ?6.5 4.7 ?2.0 100 Ankle - EDB 9   19.1     Fib head EDB 12.9  4.2  90 Fib head - Ankle 30 44 ?44 18.8     Pop fossa EDB 15.1  4.1  96.7 Pop fossa - Fib head 10 46 ?44 16.4         Pop fossa - Ankle      R Tibial - AH     Ankle AH 4.0 ?5.8 10.2 ?4.0 100 Ankle - AH 9   36.2     Pop fossa AH 11.8  9.0  88.2 Pop fossa - Ankle 36 46 ?41 38.2  L Tibial - AH     Ankle AH 4.7 ?5.8 7.5 ?4.0 100 Ankle - AH 9   27.0     Pop fossa AH 12.2  7.5  100 Pop fossa - Ankle 36 48 ?41 29.3                 SNC    Nerve / Sites Rec. Site Peak Lat Ref.  Amp Ref. Segments Distance Peak Diff Ref.    ms ms V V  cm ms ms  R Sural - Ankle (Calf)     Calf Ankle 3.0 ?4.4 13 ?6 Calf - Ankle 14     L Sural - Ankle (Calf)     Calf Ankle 3.9 ?4.4 12 ?6 Calf - Ankle 14    R Superficial peroneal - Ankle     Lat leg Ankle 3.4 ?4.4 6 ?6 Lat leg - Ankle 14    L Superficial peroneal - Ankle     Lat leg Ankle 3.6 ?4.4 6 ?6 Lat leg - Ankle 14    L Median, Ulnar - Transcarpal comparison     Median Palm Wrist 2.2 ?2.2 111 ?35 Median Palm - Wrist 8       Ulnar Palm Wrist 1.7 ?2.2 29 ?12 Ulnar Palm - Wrist 8          Median Palm - Ulnar Palm  0.5 ?0.4  R Median, Ulnar - Transcarpal comparison     Median Palm Wrist 2.1 ?2.2 50 ?35 Median Palm - Wrist 8       Ulnar Palm Wrist 1.9 ?2.2 14 ?12 Ulnar Palm - Wrist 8          Median Palm - Ulnar Palm  0.2 ?0.4  L Median - Orthodromic (Dig II, Mid palm)     Dig II Wrist 3.0 ?3.4 36 ?10 Dig II - Wrist 13    R Median - Orthodromic (Dig II, Mid palm)     Dig II Wrist 3.1 ?3.4 17 ?10 Dig II - Wrist 13    L Ulnar - Orthodromic, (Dig V, Mid palm)     Dig V Wrist 2.2 ?3.1 12 ?5 Dig V - Wrist 11    R Ulnar - Orthodromic, (Dig V, Mid palm)     Dig V Wrist 2.4 ?3.1 7 ?5 Dig V - Wrist 65                             F  Wave    Nerve F Lat Ref.   ms ms  R Tibial - AH 46.4 ?56.0  L Tibial - AH 46.1 ?56.0  L Ulnar - ADM 25.0 ?32.0           EMG full  EMG Summary Table    Spontaneous MUAP Recruitment  Muscle IA Fib PSW Fasc Other Amp Dur. Poly Pattern  L. Deltoid Normal None None None _______ Normal Normal Normal Normal  L. Triceps brachii Normal None None None _______ Normal Normal Normal Normal  L. Biceps brachii Normal None None None _______ Normal Normal Normal Normal  L. Pronator teres Normal None None None _______ Normal Normal Normal Normal  L. First dorsal interosseous Normal None None None _______ Normal Normal Normal Normal  L. Opponens pollicis Normal None None None _______ Normal Normal Normal Normal  R. Iliopsoas Normal None None None _______ Normal Normal Normal Normal  R. Vastus medialis Normal None None None _______ Normal Normal  Normal Normal  R. Tibialis anterior Normal None None None _______ Normal Normal Normal Normal  R. Gastrocnemius (Medial head) Normal None None None _______ Normal Normal Normal Normal  R. Biceps femoris (long head) Normal None None None _______ Normal Normal Normal Normal  R. Gluteus medius Normal None None None _______ Normal Normal Normal Normal  R. Lumbar paraspinals (low) Normal None None None _______ Normal Normal Normal Normal  L. Cervical paraspinals (low) Normal None None None _______ Normal Normal Normal Normal

## 2018-01-06 NOTE — Progress Notes (Signed)
See procedure note.

## 2018-01-06 NOTE — Telephone Encounter (Signed)
Called and spoke to [facility] with Dr. Beryle Beams , Jeneen Rinks she is going to speak to him and see when he can see Patient. Doris will call me back . [phone].

## 2018-01-06 NOTE — Telephone Encounter (Signed)
Doris called back and she back to Dr. Beryle Beams office and he recommends that patient be seen at the cancer center. I have called and left Seth Bake a message about scheduling [phone]

## 2018-01-06 NOTE — Procedures (Signed)
Full Name: Patricia Mathews Gender: Female MRN #: [id number] Date of Birth: 16-Jul-2061    Visit Date: 01/06/18 09:06 Age: 57 Years 20 Months Old Examining Physician: Sarina Ill, MD  Referring Physician: Dr. Brett Fairy, MD    History:  57 year old female here for evaluation of multiple neurologic symptoms. Past medical history systemic lupus, Sjogren's,high blood pressure, postmenopausal symptoms, anxiety. She has a significant family history of autoimmune disorders. She is also obese, fibromyalgia, paresthesias, other fatigue, and sciatica of the left leg. She has a constellation of symptoms including severe fatigue, paresthesias, electrical jolts down her spine (Lheurmitte?), excruciating radicular symptoms, weakness, headaches, vertigo, nystagmis, snoring, "drop attacks", and other signs and symptoms. Extensive labwork, neuroimaging to date unrevealing. A sleep study has been ordered and pending. Evaluation today for peripheral nerve or muscle disease.   Summary: EMG/NCS was performed on the bilateral lower extremities and left upper extremity.  The left median/ulnar (palm) comparison nerve showed prolonged distal peak latency (Median Palm, 2.2 ms, N<2.2) and abnormal peak latency difference (Median Palm-Ulnar Palm, 0.5 ms, N<0.4) with a relative median delay.   All remaining nerves were within normal limits All muscles were within normal limits   Conclusion: Possible mild left Carpal Tunnel Syndrome. Otherwise this is a normal exam.  Discussed. Discussed findings with patient and husband, reviewed images and answered questions. MRI brain and cervical spine unremarkable. Discussed upcoming sleep evaluation. Also following with rheumatology. Reviewed labs with patient. Reviewed labs: She has mild B12 deficient (B12 393 and elevated MMA), also Kappa light chain spike on IFE needs referral to hematology to rule out amyloidosis. Recommend daily oral B12.  A total of 40 minutes was  spent in with this patient and husband face-to-face. Over half this time was spent on counseling patient on the b12 deficiency and abnormal IFE, reviewing images with patient and husband and different differential and diagnostic options available. This does not include time spent performing procedure. Referral to Hematology.  Sarina Ill, M.D.  [facility] [place],  [address] Tel: [phone] Fax: [phone]        [facility]    Nerve / Sites Muscle Latency Ref. Amplitude Ref. Rel Amp Segments Distance Velocity Ref. Area    ms ms mV mV %  cm m/s m/s mVms  L Median - APB     Wrist APB 3.4 ?4.4 5.7 ?4.0 100 Wrist - APB 7   16.6     Upper arm APB 7.0  5.6  98 Upper arm - Wrist 20 56 ?49 18.5  L Ulnar - ADM     Wrist ADM 2.4 ?3.3 6.2 ?6.0 100 Wrist - ADM 7   16.7     B.Elbow ADM 5.5  5.7  91.8 B.Elbow - Wrist 18 60 ?49 16.6     A.Elbow ADM 7.2  5.7  100 A.Elbow - B.Elbow 10 58 ?49 16.5         A.Elbow - Wrist      R Peroneal - EDB     Ankle EDB 6.3 ?6.5 3.9 ?2.0 100 Ankle - EDB 9   13.6     Fib head EDB 12.3  3.5  91 Fib head - Ankle 30 49 ?44 13.7     Pop fossa EDB 14.3  3.4  96.6 Pop fossa - Fib head 10 52 ?44 13.6         Pop fossa - Ankle      L Peroneal - EDB  Ankle EDB 6.1 ?6.5 4.7 ?2.0 100 Ankle - EDB 9   19.1     Fib head EDB 12.9  4.2  90 Fib head - Ankle 30 44 ?44 18.8     Pop fossa EDB 15.1  4.1  96.7 Pop fossa - Fib head 10 46 ?44 16.4         Pop fossa - Ankle      R Tibial - AH     Ankle AH 4.0 ?5.8 10.2 ?4.0 100 Ankle - AH 9   36.2     Pop fossa AH 11.8  9.0  88.2 Pop fossa - Ankle 36 46 ?41 38.2  L Tibial - AH     Ankle AH 4.7 ?5.8 7.5 ?4.0 100 Ankle - AH 9   27.0     Pop fossa AH 12.2  7.5  100 Pop fossa - Ankle 36 48 ?41 29.3                 SNC    Nerve / Sites Rec. Site Peak Lat Ref.  Amp Ref. Segments Distance Peak Diff Ref.    ms ms V V  cm ms ms  R Sural - Ankle (Calf)     Calf Ankle 3.0 ?4.4 13 ?6 Calf - Ankle 14     L Sural - Ankle (Calf)     Calf Ankle 3.9 ?4.4 12 ?6 Calf - Ankle 14    R Superficial peroneal - Ankle     Lat leg Ankle 3.4 ?4.4 6 ?6 Lat leg - Ankle 14    L Superficial peroneal - Ankle     Lat leg Ankle 3.6 ?4.4 6 ?6 Lat leg - Ankle 14    L Median, Ulnar - Transcarpal comparison     Median Palm Wrist 2.2 ?2.2 111 ?35 Median Palm - Wrist 8       Ulnar Palm Wrist 1.7 ?2.2 29 ?12 Ulnar Palm - Wrist 8          Median Palm - Ulnar Palm  0.5 ?0.4  R Median, Ulnar - Transcarpal comparison     Median Palm Wrist 2.1 ?2.2 50 ?35 Median Palm - Wrist 8       Ulnar Palm Wrist 1.9 ?2.2 14 ?12 Ulnar Palm - Wrist 8          Median Palm - Ulnar Palm  0.2 ?0.4  L Median - Orthodromic (Dig II, Mid palm)     Dig II Wrist 3.0 ?3.4 36 ?10 Dig II - Wrist 13    R Median - Orthodromic (Dig II, Mid palm)     Dig II Wrist 3.1 ?3.4 17 ?10 Dig II - Wrist 13    L Ulnar - Orthodromic, (Dig V, Mid palm)     Dig V Wrist 2.2 ?3.1 12 ?5 Dig V - Wrist 11    R Ulnar - Orthodromic, (Dig V, Mid palm)     Dig V Wrist 2.4 ?3.1 7 ?5 Dig V - Wrist 48                             F  Wave    Nerve F Lat Ref.   ms ms  R Tibial - AH 46.4 ?56.0  L Tibial - AH 46.1 ?56.0  L Ulnar - ADM 25.0 ?32.0           EMG full  EMG Summary Table    Spontaneous MUAP Recruitment  Muscle IA Fib PSW Fasc Other Amp Dur. Poly Pattern  L. Deltoid Normal None None None _______ Normal Normal Normal Normal  L. Triceps brachii Normal None None None _______ Normal Normal Normal Normal  L. Biceps brachii Normal None None None _______ Normal Normal Normal Normal  L. Pronator teres Normal None None None _______ Normal Normal Normal Normal  L. First dorsal interosseous Normal None None None _______ Normal Normal Normal Normal  L. Opponens pollicis Normal None None None _______ Normal Normal Normal Normal  R. Iliopsoas Normal None None None _______ Normal Normal Normal Normal  R. Vastus medialis Normal None None None _______ Normal Normal  Normal Normal  R. Tibialis anterior Normal None None None _______ Normal Normal Normal Normal  R. Gastrocnemius (Medial head) Normal None None None _______ Normal Normal Normal Normal  R. Biceps femoris (long head) Normal None None None _______ Normal Normal Normal Normal  R. Gluteus medius Normal None None None _______ Normal Normal Normal Normal  R. Lumbar paraspinals (low) Normal None None None _______ Normal Normal Normal Normal  L. Cervical paraspinals (low) Normal None None None _______ Normal Normal Normal Normal

## 2018-01-10 ENCOUNTER — Encounter: Payer: Self-pay | Admitting: Neurology

## 2018-01-17 ENCOUNTER — Ambulatory Visit: Payer: 59 | Admitting: Neurology

## 2018-01-17 MED FILL — ALPRAZolam 0.25 MG TABS: 0.25 | 15 days supply | Qty: 30 | Fill #3

## 2018-01-19 ENCOUNTER — Other Ambulatory Visit: Payer: Self-pay | Admitting: Emergency Medicine

## 2018-01-19 MED ORDER — LISINOPRIL 20 MG PO TABS: 20.0000 mg | ORAL_TABLET | Freq: Every day | ORAL | 3 refills | Status: DC

## 2018-01-19 MED ORDER — DULOXETINE HCL 30 MG PO CPEP: 30.0000 mg | ORAL_CAPSULE | Freq: Two times a day (BID) | ORAL | 3 refills | Status: DC

## 2018-01-24 NOTE — Progress Notes (Signed)
HEMATOLOGY/ONCOLOGY CONSULTATION NOTE  Date of Service: 01/26/2018  Patient Care Team: Horald Pollen, MD as PCP - General (Internal Medicine)  CHIEF COMPLAINTS/PURPOSE OF CONSULTATION:  Elevated kappa light chains r/o AL Amyloidosis  HISTORY OF PRESENTING ILLNESS:   Patricia Mathews is a wonderful 57 y.o. female who has been referred to [place] by Dr Sarina Ill MD for evaluation and management of elevated kappa light chains r/o amyloidosis.   The pt reports that her spine and left foot have experienced sensations of painful electricity. She recently had a nerve conduciton study which did not show any signs of neuropathy. She notes that there was no explanation for these symptoms from her neurology visits. She notes muscle pain, hip pain, feelings of being cold which she relates to her lupus and Sjogren's.  She denies any concern for organ effects from her lupus. She notes that she has daily headaches and occasional migraines. She notes that her headaches decrease as she drinks espresso, which she notes that she consumes 10-12 shots of each day.  She notes that she takes Tylenol and Xanax for symptom relief. She notes that she has not had any immunosuppressive therapies. She is currently on Cymbalta 30mg , twice each day which she notes keeps her symptoms relieved enough that she can get out of bed each day. She notes that 20mg  Prednisone alleviates all of her lupus-related symptoms, and she is able to take it for a few days before it gives her intense migraines and she has to discontinue taking it.  She notes that two weeks ago she began Vitamin B12 3066mcg daily, with the under the tongue preparation.   She notes that she has very bad vertigo. She also notes that on two occassions she passed out, which was investigated further with ECG and EKGs which revealed nothing.   She notes that both of her brothers (10 y/o and 37 y/o) and father have all had prostate cancer related surgeries  in the last 5 months. She sites these as being quite stressful. She notes that she eats sweet foods for stress relief.   She denies any new bone pains. She denies any heart problems. She denies any concern for thyroid problems.   She notes that when she looks up or down too fast or too much, she gets exceptionally dizzy.  She will have a sleep study on 02/02/18.  Of note prior to the patient's visit today, pt has had Brain and C-Spine MRI completed on 12/29/17 with both results revealing nothing remarkable and no acute findings.   Most recent lab results (12/27/17) of MMP lab is as follows: all values are WNL except for Beta Globulin at 1.4, Free Kappa lt chains at 20.8.  CBC and CMP labs 12/03/17 revealed all values WNL except for Glucose at 112.   On review of systems, pt reports sensations of electricity in her legs and spine, daily headaches, feelings of being cold, and denies unexpected weight loss, fevers, chills, night sweats, pain along the spine, joint pain or stiffness, and any other symptoms.   On PMHx the pt reports systemic lupus and Sjogren's, high blood pressure. She notes two C-sections. Denies On Social Hx the pt denies any smoking history and reports infrequent mild ETOH consumption.  On Family Hx the pt reports sarcoidosis, an aunt who had amyloidosis, prostate cancer and ovarian cancer.    MEDICAL HISTORY:  Past Medical History:  Diagnosis Date  . Anxiety   . Hypertension   . Lupus   .  Panic attacks   . Sjogren's disease ([place])     SURGICAL HISTORY: Past Surgical History:  Procedure Laterality Date  . CESAREAN SECTION     X2  . MOUTH SURGERY      SOCIAL HISTORY: Social History   Socioeconomic History  . Marital status: Married    Spouse name: Not on file  . Number of children: 2  . Years of education: post-grad  . Highest education level: Not on file  Social Needs  . Financial resource strain: Not on file  . Food insecurity - worry: Not on file  . Food  insecurity - inability: Not on file  . Transportation needs - medical: Not on file  . Transportation needs - non-medical: Not on file  Occupational History  . Not on file  Tobacco Use  . Smoking status: Former Smoker    Last attempt to quit: 1989    Years since quitting: 30.2  . Smokeless tobacco: Never Used  Substance and Sexual Activity  . Alcohol use: Yes    Comment: social  . Drug use: No  . Sexual activity: Yes    Partners: Male    Comment: 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15, PARTNERS- 3, MARRIED- 54 YRS   Other Topics Concern  . Not on file  Social History Narrative   Lives at home with her husband   Right handed   2 cups of caffeine daily    FAMILY HISTORY: Family History  Problem Relation Age of Onset  . Hypertension Mother   . Osteoarthritis Mother   . Cancer Father        PROSTATE  . Hypertension Father   . Hypertension Sister   . Lupus Sister   . Cancer Brother        PROSTATE  . Lupus Brother   . Hypertension Brother   . Sarcoidosis Paternal Aunt   . Hypertension Brother   . Hypertension Brother   . Breast cancer Neg Hx     ALLERGIES:  is allergic to penicillins.  MEDICATIONS:  Current Outpatient Medications  Medication Sig Dispense Refill  . ALPRAZolam (XANAX) 0.25 MG tablet Take 1 tablet (0.25 mg total) by mouth 2 (two) times daily as needed for anxiety. 30 tablet 3  . cholecalciferol (VITAMIN D) 1000 units tablet Take 1,000 Units by mouth daily.    . DULoxetine (CYMBALTA) 30 MG capsule Take 1 capsule (30 mg total) by mouth 2 (two) times daily. 180 capsule 3  . lisinopril (PRINIVIL,ZESTRIL) 20 MG tablet Take 1 tablet (20 mg total) by mouth daily. 90 tablet 3  . progesterone (PROMETRIUM) 100 MG capsule Take 1 capsule (100 mg total) by mouth at bedtime. 90 capsule 4   No current facility-administered medications for this visit.    Facility-Administered Medications Ordered in Other Visits  Medication Dose Route Frequency Provider Last Rate Last Dose  .  gadopentetate dimeglumine (MAGNEVIST) injection 20 mL  20 mL Intravenous Once PRN Melvenia Beam, MD        REVIEW OF SYSTEMS:    10 Point review of Systems was done is negative except as noted above.  PHYSICAL EXAMINATION:  . Vitals:   01/26/18 1129  BP: 123/89  Pulse: 87  Resp: 16  Temp: 98.9 F (37.2 C)  SpO2: 100%   Filed Weights   01/26/18 1129  Weight: 195 lb 11.2 oz (88.8 kg)   .Body mass index is 32.57 kg/m.  GENERAL:alert, in no acute distress and comfortable SKIN: no acute rashes, no significant lesions EYES:  conjunctiva are pink and non-injected, sclera anicteric OROPHARYNX: MMM, no exudates, no oropharyngeal erythema or ulceration NECK: supple, no JVD LYMPH:  no palpable lymphadenopathy in the cervical, axillary or inguinal regions LUNGS: clear to auscultation b/l with normal respiratory effort HEART: regular rate & rhythm ABDOMEN:  normoactive bowel sounds , non tender, not distended. Extremity: no pedal edema PSYCH: alert & oriented x 3 with fluent speech NEURO: no focal motor/sensory deficits  LABORATORY DATA:  I have reviewed the data as listed  . CBC Latest Ref Rng & Units 01/26/2018  WBC 3.9 - 10.3 K/uL 6.0  Hematocrit 34.8 - 46.6 % 42.5  Platelets 145 - 400 K/uL 370  HGB 14.1 . CBC    Component Value Date/Time   WBC 6.0 01/26/2018 1245   RBC 4.51 01/26/2018 1245   RBC 4.44 01/26/2018 1245   HCT 42.5 01/26/2018 1245   PLT 370 01/26/2018 1245   MCV 94.3 01/26/2018 1245   MCH 31.3 01/26/2018 1245   MCHC 33.2 01/26/2018 1245   RDW 13.4 01/26/2018 1245   LYMPHSABS 1.9 01/26/2018 1245   MONOABS 0.5 01/26/2018 1245   EOSABS 0.1 01/26/2018 1245   BASOSABS 0.1 01/26/2018 1245    . CMP Latest Ref Rng & Units 01/26/2018  Glucose 70 - 140 mg/dL 99  BUN 7 - 26 mg/dL 15  Creatinine 0.60 - 1.10 mg/dL 0.79  Sodium 136 - 145 mmol/L 139  Potassium 3.5 - 5.1 mmol/L 4.2  Chloride 98 - 109 mmol/L 103  CO2 22 - 29 mmol/L 27  Calcium 8.4 - 10.4  mg/dL 10.1  Total Protein 6.4 - 8.3 g/dL 8.3  Total Bilirubin 0.2 - 1.2 mg/dL 0.5  Alkaline Phos 40 - 150 U/L 86  AST 5 - 34 U/L 18  ALT 0 - 55 U/L 21   Component     Latest Ref Rng & Units 01/26/2018  Kappa free light chain     3.3 - 19.4 mg/L 21.3 (H)  Lamda free light chains     5.7 - 26.3 mg/L 13.7  Kappa, lamda light chain ratio     0.26 - 1.65 1.55  Sed Rate     0 - 22 mm/hr 14  LDH     125 - 245 U/L 182     RADIOGRAPHIC STUDIES: I have personally reviewed the radiological images as listed and agreed with the findings in the report. Mr Jeri Cos NI Contrast  Result Date: 12/30/2017 [facility] NEUROIMAGING REPORT STUDY DATE: 12/29/17 PATIENT NAME: Patricia Mathews DOB: 04-17-1961 MRN: [id number] ORDERING CLINICIAN: Sarina Ill, MD CLINICAL HISTORY: 57 year old female with gait difficulty. EXAM: MRI brain (with and without) TECHNIQUE: MRI of the brain with and without contrast was obtained utilizing 5 mm axial slices with T1, T2, T2 flair, T2 star gradient echo and diffusion weighted views.  T1 sagittal, T2 coronal and postcontrast views in the axial and coronal plane were obtained. CONTRAST: 57ml magnevist COMPARISON: none IMAGING SITE: [facility] [address] (1.5 Tesla MRI) FINDINGS: No abnormal lesions are seen on diffusion-weighted views to suggest acute ischemia. The cortical sulci, fissures and cisterns are normal in size and appearance. Lateral, third and fourth ventricle are normal in size and appearance. No extra-axial fluid collections are seen. No evidence of mass effect or midline shift.  No abnormal lesions are seen on post contrast views.  On sagittal views the posterior fossa, pituitary gland and corpus callosum are notable for incidental posterior fossa arachnoid cyst (4.7 x  2.4cm on axial views). No evidence of intracranial hemorrhage on gradient-echo views. The orbits and their contents, paranasal sinuses and calvarium are  unremarkable.  Intracranial flow voids are present.   Unremarkable MRI brain (with and without). No acute findings. INTERPRETING PHYSICIAN: Penni Bombard, MD Certified in Neurology, Neurophysiology and Neuroimaging [facility] [address], [place], [place] [address] [phone]   Mr Cervical Spine W [place] Contrast  Result Date: 12/30/2017 [facility] NEUROIMAGING REPORT STUDY DATE: 12/29/17 PATIENT NAME: Patricia Mathews DOB: 07-May-1961 MRN: [id number] ORDERING CLINICIAN: Sarina Ill, MD CLINICAL HISTORY: 57 year old female with gait difficulty. EXAM: MRI cervical spine (with and without) TECHNIQUE: MRI of the cervical spine was obtained utilizing 3 mm sagittal slices from the posterior fossa down to the T3-4 level with T1, T2 and inversion recovery views. In addition 4 mm axial slices from B0-1 down to T1-2 level were included with T2 and gradient echo views. CONTRAST: 66ml magnevist COMPARISON: none IMAGING SITE: [facility] [address] (1.5 Tesla MRI) FINDINGS: On sagittal views the vertebral bodies have normal height and alignment. Minimal disc bulging at C5-6 and C6-7. The spinal cord is normal in size and appearance. The posterior fossa, pituitary gland and paraspinal soft tissues are unremarkable.  On axial views there is no spinal stenosis or foraminal narrowing. Limited views of the soft tissues of the head and neck are notable for posterior fossa arachnoid cyst.   Unremarkable MRI cervical spine (with and without). No intrinsic, compressive or abnormal enhancing spinal cord lesions. INTERPRETING PHYSICIAN: Penni Bombard, MD Certified in Neurology, Neurophysiology and Neuroimaging [facility] [address] [place], [place] [address] [phone]    ASSESSMENT & PLAN:  57 y.o. female with  1. Elevated Kappa Light Chains -Discussed patient's most recent labs, Beta Globulin slightly elevated at  1.4 and Kappa free light chains slightly elevated at 20.8.  But normal serum freelight chain ratio and negM spike. PLAN -unclear etiology for symptoms neurological symptoms. -- following with neurology. ? Related to her SLE/Sjogren vs arachnoid cyst . -EMG/NCS - no evidence of overt neuropathy to suggest amyloidosis. -MMP lab abnormalities are borderline-high which suggest that they could be from chronic inflammation.  -Pt does not meet CRAB criteria. Creatinine was 0.75, Calcium was WNL, no anemia, blood counts are normal, no new bone pains cited. -Optimizing treatment of her lupus will likely be key for her symptom relief and inflammatory responses.  -Repeat blood labs and MMP lab and 24hr urine.  -If symptoms worsen or become progressive, this would be a consideration for further workup, repeat nerve conduction study, and possibly a BM bx.  -Continue following up with rheumatologist  Dr. Gavin Pound and neurologist to optimize lupus/Sjogren's treatment and further neurological w/u as indicated.  2. Vertigo -Recommend her PCP refer her to a Vestibular PT to reposition displaced calcium crystals that are likely causing her vertigo.    Labs today RTC with Dr Irene Limbo as needed based on labs   All of the patients questions were answered with apparent satisfaction. The patient knows to call the clinic with any problems, questions or concerns.  I spent 35 minutes counseling the patient face to face. The total time spent in the appointment was 45 minutes and more than 50% was on counseling and direct patient cares.    Sullivan Lone MD [place] AAHIVMS [facility] Hematology/Oncology Physician [facility]  (Office):       [phone] (Work  cell):  [phone] (Fax):           [phone]  01/26/2018 11:50 AM  This document serves as a record of services personally performed by Sullivan Lone, MD. It was created on his behalf by Baldwin Jamaica, a trained medical scribe. The creation of this  record is based on the scribe's personal observations and the provider's statements to them.   .I have reviewed the above documentation for accuracy and completeness, and I agree with the above.  Sullivan Lone MD MS

## 2018-01-26 ENCOUNTER — Inpatient Hospital Stay: Payer: 59

## 2018-01-26 ENCOUNTER — Inpatient Hospital Stay: Payer: 59 | Attending: Hematology | Admitting: Hematology

## 2018-01-26 ENCOUNTER — Encounter: Payer: Self-pay | Admitting: Hematology

## 2018-01-26 ENCOUNTER — Telehealth: Payer: Self-pay

## 2018-01-26 VITALS — BP 123/89 | HR 87 | Temp 98.9°F | Resp 16 | Ht 65.0 in | Wt 195.7 lb

## 2018-01-26 DIAGNOSIS — Z87891 Personal history of nicotine dependence: Secondary | ICD-10-CM

## 2018-01-26 DIAGNOSIS — R42 Dizziness and giddiness: Secondary | ICD-10-CM | POA: Diagnosis not present

## 2018-01-26 DIAGNOSIS — I10 Essential (primary) hypertension: Secondary | ICD-10-CM | POA: Diagnosis not present

## 2018-01-26 DIAGNOSIS — M329 Systemic lupus erythematosus, unspecified: Secondary | ICD-10-CM | POA: Diagnosis not present

## 2018-01-26 DIAGNOSIS — R768 Other specified abnormal immunological findings in serum: Secondary | ICD-10-CM

## 2018-01-26 DIAGNOSIS — Z8041 Family history of malignant neoplasm of ovary: Secondary | ICD-10-CM

## 2018-01-26 DIAGNOSIS — M3500 Sicca syndrome, unspecified: Secondary | ICD-10-CM

## 2018-01-26 DIAGNOSIS — Z8042 Family history of malignant neoplasm of prostate: Secondary | ICD-10-CM

## 2018-01-26 LAB — CMP (CANCER CENTER ONLY)
ALK PHOS: 86 U/L (ref 40–150)
ALT: 21 U/L (ref 0–55)
AST: 18 U/L (ref 5–34)
Albumin: 4.2 g/dL (ref 3.5–5.0)
Anion gap: 9 (ref 3–11)
BILIRUBIN TOTAL: 0.5 mg/dL (ref 0.2–1.2)
BUN: 15 mg/dL (ref 7–26)
CO2: 27 mmol/L (ref 22–29)
Calcium: 10.1 mg/dL (ref 8.4–10.4)
Chloride: 103 mmol/L (ref 98–109)
Creatinine: 0.79 mg/dL (ref 0.60–1.10)
GFR, Est AFR Am: >60 mL/min (ref >60)
GFR, Estimated: >60 mL/min (ref >60)
GLUCOSE: 99 mg/dL (ref 70–140)
POTASSIUM: 4.2 mmol/L (ref 3.5–5.1)
SODIUM: 139 mmol/L (ref 136–145)
TOTAL PROTEIN: 8.3 g/dL (ref 6.4–8.3)

## 2018-01-26 LAB — RETICULOCYTES
RBC.: 4.44 MIL/uL (ref 3.70–5.45)
RETIC CT PCT: 1.2 % (ref 0.7–2.1)
Retic Count, Absolute: 53.3 10*3/uL (ref 33.7–90.7)

## 2018-01-26 LAB — LACTATE DEHYDROGENASE: LDH: 182 U/L (ref 125–245)

## 2018-01-26 LAB — CBC WITH DIFFERENTIAL (CANCER CENTER ONLY)
Basophils Absolute: 0.1 10*3/uL (ref 0.0–0.1)
Basophils Relative: 1 %
EOS ABS: 0.1 10*3/uL (ref 0.0–0.5)
Eosinophils Relative: 2 %
HEMATOCRIT: 42.5 % (ref 34.8–46.6)
HEMOGLOBIN: 14.1 g/dL (ref 11.6–15.9)
LYMPHS ABS: 1.9 10*3/uL (ref 0.9–3.3)
LYMPHS PCT: 32 %
MCH: 31.3 pg (ref 25.1–34.0)
MCHC: 33.2 g/dL (ref 31.5–36.0)
MCV: 94.3 fL (ref 79.5–101.0)
MONOS PCT: 8 %
Monocytes Absolute: 0.5 10*3/uL (ref 0.1–0.9)
NEUTROS PCT: 57 %
Neutro Abs: 3.4 10*3/uL (ref 1.5–6.5)
Platelet Count: 370 10*3/uL (ref 145–400)
RBC: 4.51 MIL/uL (ref 3.70–5.45)
RDW: 13.4 % (ref 11.2–14.5)
WBC Count: 6 10*3/uL (ref 3.9–10.3)

## 2018-01-26 LAB — SEDIMENTATION RATE: Sed Rate: 14 mm/hr (ref 0–22)

## 2018-01-26 NOTE — Telephone Encounter (Signed)
Printed patient avs. And scheduled her for lab addon. RTC with Dr Irene Limbo as needed based on labs. Per 3/20

## 2018-01-26 NOTE — Patient Instructions (Signed)
Thank you for choosing [facility] to provide your oncology and hematology care.  To afford each patient quality time with our providers, please arrive 30 minutes before your scheduled appointment time.  If you arrive late for your appointment, you may be asked to reschedule.  We strive to give you quality time with our providers, and arriving late affects you and other patients whose appointments are after yours.   If you are a no show for multiple scheduled visits, you may be dismissed from the clinic at the providers discretion.    Again, thank you for choosing [facility], our hope is that these requests will decrease the amount of time that you wait before being seen by our physicians.  ______________________________________________________________________  Should you have questions after your visit to the [facility], please contact our office at [phone] between the hours of 8:30 and 4:30 p.m.    Voicemails left after 4:30p.m will not be returned until the following business day.    For prescription refill requests, please have your pharmacy contact us directly.  Please also try to allow 48 hours for prescription requests.    Please contact the scheduling department for questions regarding scheduling.  For scheduling of procedures such as PET scans, CT scans, MRI, Ultrasound, etc please contact central scheduling at [phone].    Resources For Cancer Patients and Caregivers:   Oncolink.org:  A wonderful resource for patients and healthcare providers for information regarding your disease, ways to tract your treatment, what to expect, etc.     American Cancer Society:  800-227-2345  Can help patients locate various types of support and financial assistance  Cancer Care: 1-800-813-HOPE (4673) Provides financial assistance, online support groups, medication/co-pay assistance.    [place] DSS:  336-641-3447 Where to apply for food  stamps, Medicaid, and utility assistance  Medicare Rights Center: 800-333-4114 Helps people with Medicare understand their rights and benefits, navigate the Medicare system, and secure the quality healthcare they deserve  SCAT: 336-333-6589 [employer] [place] [employer]'s shared-ride transportation service for eligible riders who have a disability that prevents them from riding the fixed route bus.    For additional information on assistance programs please contact our social worker:   Grier Hock/Abigail Elmore:  [phone]            

## 2018-01-27 LAB — KAPPA/LAMBDA LIGHT CHAINS
KAPPA FREE LGHT CHN: 21.3 mg/L — AB (ref 3.3–19.4)
KAPPA, LAMDA LIGHT CHAIN RATIO: 1.55 (ref 0.26–1.65)
LAMDA FREE LIGHT CHAINS: 13.7 mg/L (ref 5.7–26.3)

## 2018-01-31 ENCOUNTER — Ambulatory Visit: Payer: 59 | Admitting: Neurology

## 2018-01-31 LAB — MULTIPLE MYELOMA PANEL, SERUM
ALBUMIN/GLOB SERPL: 1.2 (ref 0.7–1.7)
ALPHA 1: 0.2 g/dL (ref 0.0–0.4)
Albumin SerPl Elph-Mcnc: 4.2 g/dL (ref 2.9–4.4)
Alpha2 Glob SerPl Elph-Mcnc: 0.8 g/dL (ref 0.4–1.0)
B-Globulin SerPl Elph-Mcnc: 1.4 g/dL — ABNORMAL HIGH (ref 0.7–1.3)
Gamma Glob SerPl Elph-Mcnc: 1.2 g/dL (ref 0.4–1.8)
Globulin, Total: 3.7 g/dL (ref 2.2–3.9)
IGA: 317 mg/dL (ref 87–352)
IGM (IMMUNOGLOBULIN M), SRM: 42 mg/dL (ref 26–217)
IgG (Immunoglobin G), Serum: 1248 mg/dL (ref 700–1600)
Total Protein ELP: 7.9 g/dL (ref 6.0–8.5)

## 2018-02-02 ENCOUNTER — Ambulatory Visit (INDEPENDENT_AMBULATORY_CARE_PROVIDER_SITE_OTHER): Payer: 59 | Admitting: Neurology

## 2018-02-02 DIAGNOSIS — M3500 Sicca syndrome, unspecified: Secondary | ICD-10-CM | POA: Diagnosis not present

## 2018-02-02 DIAGNOSIS — R768 Other specified abnormal immunological findings in serum: Secondary | ICD-10-CM | POA: Diagnosis not present

## 2018-02-02 DIAGNOSIS — M2550 Pain in unspecified joint: Secondary | ICD-10-CM

## 2018-02-02 DIAGNOSIS — Z8041 Family history of malignant neoplasm of ovary: Secondary | ICD-10-CM | POA: Diagnosis not present

## 2018-02-02 DIAGNOSIS — R519 Headache, unspecified: Secondary | ICD-10-CM

## 2018-02-02 DIAGNOSIS — Z8042 Family history of malignant neoplasm of prostate: Secondary | ICD-10-CM | POA: Diagnosis not present

## 2018-02-02 DIAGNOSIS — M329 Systemic lupus erythematosus, unspecified: Secondary | ICD-10-CM | POA: Diagnosis not present

## 2018-02-02 DIAGNOSIS — G4710 Hypersomnia, unspecified: Secondary | ICD-10-CM | POA: Diagnosis not present

## 2018-02-02 DIAGNOSIS — R51 Headache: Secondary | ICD-10-CM

## 2018-02-02 DIAGNOSIS — R0683 Snoring: Secondary | ICD-10-CM

## 2018-02-02 DIAGNOSIS — I10 Essential (primary) hypertension: Secondary | ICD-10-CM | POA: Diagnosis not present

## 2018-02-02 DIAGNOSIS — Z87891 Personal history of nicotine dependence: Secondary | ICD-10-CM | POA: Diagnosis not present

## 2018-02-02 DIAGNOSIS — R42 Dizziness and giddiness: Secondary | ICD-10-CM | POA: Diagnosis not present

## 2018-02-02 DIAGNOSIS — G4730 Sleep apnea, unspecified: Principal | ICD-10-CM

## 2018-02-03 ENCOUNTER — Encounter: Payer: Self-pay | Admitting: Emergency Medicine

## 2018-02-03 LAB — UIFE/LIGHT CHAINS/TP QN, 24-HR UR
FR KAPPA LT CH,24HR: 27 mg/24 hr
FR LAMBDA LT CH,24HR: 2 mg/24 hr
FREE KAPPA LT CHAINS, UR: 6.79 mg/L (ref 1.35–24.19)
Free Kappa/Lambda Ratio: 11.13 — ABNORMAL HIGH (ref 2.04–10.37)
Free Lambda Lt Chains,Ur: 0.61 mg/L (ref 0.24–6.66)
Total Protein, Urine-Ur/day: <160 mg/24 hr — ABNORMAL HIGH (ref 30–150)

## 2018-02-06 NOTE — Addendum Note (Signed)
Addended by: Larey Seat on: 02/06/2018 05:19 PM   Modules accepted: Orders

## 2018-02-06 NOTE — Procedures (Signed)
PATIENT'S NAME:  Patricia, Mathews DOB:      05-23-1961      MR#:    [id number]     DATE OF RECORDING: 02/02/2018 REFERRING M.D.:  Sarina Ill, MD Study Performed:   Baseline Polysomnogram HISTORY:  Patricia Mathews is a 57 y.o. female patient, seen in referral from Dr. Jaynee Eagles for an evaluation of snoring and possible sleep apnea.   Dr. Jaynee Eagles is working her up for headaches with vertigo and nystagmus, stabbing pain sensation, feeling pain that radiates down her spine and occurs weekly. She was diagnosed with Lupus and Sjogren's syndrome over 10 years ago while living in [place]. Her husband, who is trained as an emergency room physician, has stated that she snores. She is post- menopausal, sleep quality was lower before she received hormone replacement therapy. The patient endorsed the Epworth Sleepiness Scale at 9/24 points and FSS at 26/63 points, GDS 2/ 15 points.  The patient's weight 194 pounds with a height of 65 (inches), resulting in a BMI of 32.3 kg/m2.The patient's neck circumference measured 14.5 inches.  CURRENT MEDICATIONS: Xanax, Cymbalta, Prinivil, Prometrium   PROCEDURE:  This is a multichannel digital polysomnogram utilizing the SomnoStar 11.2 system.  Electrodes and sensors were applied and monitored per AASM Specifications.   EEG, EOG, Chin and Limb EMG, were sampled at 200 Hz.  ECG, Snore and Nasal Pressure, Thermal Airflow, Respiratory Effort, CPAP Flow and Pressure, Oximetry was sampled at 50 Hz. Digital video and audio were recorded.      BASELINE STUDY: Lights Out was at 22:23 and Lights On at 05:02.  Total recording time (TRT) was 400 minutes, with a total sleep time (TST) of 308 minutes.   The patient's sleep latency was 20.5 minutes.  REM latency was 169 minutes.  The sleep efficiency was 77. %.     SLEEP ARCHITECTURE: WASO (Wake after sleep onset) was 68.5 minutes.  There were 46.5 minutes in Stage N1, 178.5 minutes Stage N2, 44.5 minutes Stage N3 and 38.5 minutes in Stage  REM.  The percentage of Stage N1 was 15.1%, Stage N2 was 58.%, Stage N3 was 14.4% and Stage R (REM sleep) was 12.5%.   RESPIRATORY ANALYSIS:  There were a total of 143 respiratory events:  51 obstructive apneas, 20 central apneas and 0 mixed apneas with 72 hypopneas. The patient also had 0 respiratory event related arousals (RERAs). The total APNEA/HYPOPNEA INDEX (AHI) was 27.9/hour and the total RESPIRATORY DISTURBANCE INDEX was 27.9 /hour.   31 events occurred in REM sleep and 134 events in NREM. The REM AHI was 48.3 /hour, versus a non-REM AHI of 24.9. The patient spent 248.5 minutes of total sleep time in the supine position and 60 minutes in non-supine. The supine AHI was 31.2/h versus a non-supine AHI of 14.1/h.  OXYGEN SATURATION & C02:  The Wake baseline 02 saturation was 96%, with the lowest being 79%. Time spent below 89% saturation equaled 15 minutes.   PERIODIC LIMB MOVEMENTS:  The patient had a total of 321 Periodic Limb Movements.  The Periodic Limb Movement (PLM) Arousal index was 17.1/hour. The arousals were noted as: 64 were spontaneous, 88 were associated with PLMs, and 66 were associated with respiratory events. Audio and video analysis did not show any abnormal or unusual movements, behaviors, phonations or vocalizations.  Nocturia only once during the study. Mild to Moderate Snoring was noted. EKG was in keeping with normal sinus rhythm (NSR). Post-study, the patient indicated that sleep was as deep as usual.  IMPRESSION:  1. Moderate - severe Obstructive Sleep Apnea (OSA) with AHI of 27.9, exacerbated during REM sleep to 48/h. 2. Severe Periodic Limb Movement Disorder (PLMD) 3. Mild Primary Snoring 4. Repetitive Intrusions of Sleep, categorized as spontaneous arousals.   RECOMMENDATIONS:  1. Advise full-night, attended, CPAP titration study to optimize therapy of OSA and snoring. Positional therapy is advised. There was more apnea and snoring in supine sleep position.   2. Avoid sedative-hypnotics, alcohol and tobacco, which may worsen sleep apnea (as applicable). 3. Advise to reduce weight by diet and exercise (BMI 32).    4. There were frequent periodic limb movements of sleep (PLMS) with associated sleep disruption.  Consider watching the PLMS during CPAP titration- if responding, treat as apnea related.   5. Avoid caffeine-containing beverages and chocolate in order to reduce insomnia and PLMs, RLS. 6. Correlate clinically for a history consistent with regarding restless legs syndrome (RLS).    Consider secondary restless legs syndrome.  Pharmacotherapy may be warranted.  Obtain a serum ferritin level if the clinical history is consistent with RLS.  Consider iron therapy and evaluation for iron deficiency anemia if the serum ferritin level < 50 ng/ml.  Certain medications / substances may aggravate RLS:  nicotine, caffeine, SSRIs, TCAs, phenothiazine, dopamine antagonists, diphenhydramine, and alcohol.   7. Consider dedicated sleep psychology referral if insomnia is of clinical concern.   8. A follow up appointment will be scheduled in the Sleep Clinic at [facility]. The referring provider will be notified of the results.     I certify that I have reviewed the entire raw data recording prior to the issuance of this report in accordance with the Standards of Accreditation of the American Academy of Sleep Medicine (AASM)   Larey Seat, MD     02-06-2018  Diplomat, American Board of Psychiatry and Neurology  Diplomat, American Board of [place] Director, [place] [facility] [employer]

## 2018-02-07 ENCOUNTER — Telehealth: Payer: Self-pay

## 2018-02-07 NOTE — Telephone Encounter (Signed)
Called patient to schedule CPAP study. Informed patient of baseline sleep study results.  Pt understood results and had no questions at this time.  Pt wanted to check husband's schedule before scheduling CPAP study.  Pt will be calling back this afternoon.

## 2018-02-09 ENCOUNTER — Ambulatory Visit (INDEPENDENT_AMBULATORY_CARE_PROVIDER_SITE_OTHER): Payer: 59 | Admitting: Neurology

## 2018-02-09 DIAGNOSIS — G4733 Obstructive sleep apnea (adult) (pediatric): Secondary | ICD-10-CM

## 2018-02-09 DIAGNOSIS — M2550 Pain in unspecified joint: Secondary | ICD-10-CM

## 2018-02-09 DIAGNOSIS — G4730 Sleep apnea, unspecified: Secondary | ICD-10-CM

## 2018-02-09 DIAGNOSIS — G4710 Hypersomnia, unspecified: Secondary | ICD-10-CM

## 2018-02-09 DIAGNOSIS — R519 Headache, unspecified: Secondary | ICD-10-CM

## 2018-02-09 DIAGNOSIS — R0683 Snoring: Secondary | ICD-10-CM

## 2018-02-09 DIAGNOSIS — R51 Headache: Secondary | ICD-10-CM

## 2018-02-12 NOTE — Procedures (Signed)
PATIENT'S NAME:  Patricia Mathews, Patricia Mathews DOB:      10/24/1961      MR#:    [id number]     DATE OF RECORDING: 02/09/2018 REFERRING M.D.:  Sarina Ill, MD  Study Performed:   Titration to Positive Airway Pressure (CPAP)  HISTORY:  [name] is returning for a CPAP titration study following a PSG from 02/02/2018. The study revealed an AHI of 27.9/h, REM AHI of 48.3, and SpO2 nadir of 79%. Severe PLMs were recorded.   The patient endorsed the Epworth Sleepiness Scale at 9/24 points and the Fatigue Score at 26 points.   The patient's weight 194 pounds with a height of 65 (inches), resulting in a BMI of 32.3 kg/m2. The patient's neck circumference measured 14.5 inches.  CURRENT MEDICATIONS: Xanax, Cymbalta, Prinivil, Prometrium  PROCEDURE:  This is a multichannel digital polysomnogram utilizing the SomnoStar 11.2 system.  Electrodes and sensors were applied and monitored per AASM Specifications.   EEG, EOG, Chin and Limb EMG, were sampled at 200 Hz.  ECG, Snore and Nasal Pressure, Thermal Airflow, Respiratory Effort, CPAP Flow and Pressure, Oximetry was sampled at 50 Hz. Digital video and audio were recorded.      CPAP was initiated at 5 cmH20 with heated humidity per AASM split night standards and pressure was advanced to 14 cmH20 because of hypopneas, apneas and desaturations.  At a PAP pressure of 14 cmH20, there was a reduction of the AHI to 0.0 with improvement of the above symptoms of obstructive sleep apnea.    Lights Out was at 21:38 and Lights On at 05:01. Total recording time (TRT) was 443.5 minutes, with a total sleep time (TST) of 356 minutes. The patient's sleep latency was 56.5 minutes with 0.5 minutes of wake time after sleep onset. REM latency was 181 minutes.  The sleep efficiency was 80.3 %.    SLEEP ARCHITECTURE: WASO (Wake after sleep onset) was 60 minutes.  There were 12.5 minutes in Stage N1, 289 minutes Stage N2, 21.5 minutes Stage N3 and 33 minutes in Stage REM.  The percentage  of Stage N1 was 3.5%, Stage N2 was 81.2%, and Stage N3 was 6.% and Stage R (REM sleep) was 9.3%.   The arousals were noted as: 23 were spontaneous, 163 were associated with PLMs, and 19 were associated with respiratory events. Audio and video analysis did not show any abnormal or unusual movements, behaviors, phonations or vocalizations. The patient took one bathroom break. Snoring was noted in the beginning but resolved later under higher pressures. EKG was in keeping with normal sinus rhythm (NSR).  RESPIRATORY ANALYSIS:  There was a total of 47 respiratory events: 13 obstructive apneas, 0 central apneas and 34 hypopneas. The patient also had 0 respiratory event related arousals (RERAs).     The total APNEA/HYPOPNEA INDEX (AHI) was 7.9 /hour and the total RESPIRATORY DISTURBANCE INDEX was 7.9/hour.  7 events occurred in REM sleep and 40 events in NREM. The REM AHI was 12.7 /hour versus a non-REM AHI of 7.4 /hour.   The patient spent 179.5 minutes of total sleep time in the supine position and 177 minutes in non-supine. The supine AHI was 10.3, versus a non-supine AHI of 5.4.  OXYGEN SATURATION & C02:  The baseline 02 saturation was 91%, with the lowest being 84%. Time spent below 89% saturation equaled 0 minutes.  PERIODIC LIMB MOVEMENTS: The patient had a total of 656 Periodic Limb Movements. The Periodic Limb Movement (PLM) index was 110.6 and the PLM Arousal  index was 27.5 /hour.  Post-study, the patient indicated that sleep was good.  The patient was fitted with a Fisher and Paykel Simplus FFM in small apparatus.  DIAGNOSIS 1. Obstructive Sleep Apnea responding to CPAP pressure at 14 cm water.   2. Upper Airway Resistance Syndrome   PLANS/RECOMMENDATIONS: Obstructive sleep apnea: Recommend an auto set CPAP of 7 through 16 cm water, 2 cm EPR, heated humidity, and a Fisher [employer] Simplus FFM in small apparatus.  We will follow up at 2 to 3 months to assess response.  I recommend to  reinforce behavioral treatment for obstructive sleep apnea, which should include: Weight loss, exercise. Avoidance of medications with muscle relaxant properties. Avoidance of ingestion of alcohol prior to sleeping. Avoiding sleeping in the supine position (on one's back).  A follow up appointment will be scheduled in the Sleep Clinic at [facility].   Please call [phone] with any questions.      I certify that I have reviewed the entire raw data recording prior to the issuance of this report in accordance with the Standards of Accreditation of the American Academy of Sleep Medicine (AASM)    Larey Seat, M.D.    02-11-2018  Diplomat, American Board of Psychiatry and Neurology  Diplomat, [place] of [facility] Medicine Medical Director, [place] [facility] at [facility]

## 2018-02-12 NOTE — Addendum Note (Signed)
Addended by: Larey Seat on: 02/12/2018 04:03 PM   Modules accepted: Orders

## 2018-02-14 ENCOUNTER — Telehealth: Payer: Self-pay

## 2018-02-14 NOTE — Telephone Encounter (Signed)
-----   Message from Larey Seat, MD sent at 02/12/2018  4:03 PM EDT ----- DIAGNOSIS 1. Obstructive Sleep Apnea responding to CPAP pressure at 14 cm  water.  2. Upper Airway Resistance Syndrome   PLANS/RECOMMENDATIONS: Obstructive sleep apnea: Recommend an auto  set CPAP of 7 through 16 cm water, 2 cm EPR, heated humidity, and a Fisher [employer] Simplus FFM in small apparatus.  I will follow up at 2 to 3 months to assess response. I recommend to reinforce behavioral treatment for obstructive sleep  apnea, which should include: Weight loss, exercise. Avoidance of medications with muscle relaxant properties and ingestion of alcohol prior to sleeping. Avoiding sleeping in the supine position (on one's back).

## 2018-02-14 NOTE — Telephone Encounter (Signed)
I called pt. I advised pt that Dr. Brett Fairy reviewed their sleep study results and found that pt has osa that responded to cpap. Dr. Brett Fairy recommends that pt start an auto pap at home, pursue weight loss, avoidance of medications with muscle relaxant properties and alcohol prior to sleeping. I advised pt to avoid sleeping supine. I reviewed PAP compliance expectations with the pt. Pt is agreeable to starting an auto-PAP. I advised pt that an order will be sent to a DME, [facility], and [facility] will call the pt within about one week after they file with the pt's insurance. [facility] will show the pt how to use the machine, fit for masks, and troubleshoot the auto-PAP if needed. A follow up appt was made for insurance purposes with Hoyle Sauer, NP on 05/19/18 at 2:15pm. Pt verbalized understanding to arrive 15 minutes early and bring their auto-PAP. A letter with all of this information in it will be sent to the pt's mychart as a reminder. I verified with the pt that the address we have on file is correct. Pt verbalized understanding of results. Pt had no questions at this time but was encouraged to call back if questions arise.

## 2018-02-25 ENCOUNTER — Encounter: Payer: Self-pay | Admitting: Neurology

## 2018-03-07 DIAGNOSIS — G4733 Obstructive sleep apnea (adult) (pediatric): Secondary | ICD-10-CM | POA: Diagnosis not present

## 2018-03-11 ENCOUNTER — Encounter

## 2018-03-11 ENCOUNTER — Ambulatory Visit: Payer: 59 | Admitting: Neurology

## 2018-04-06 DIAGNOSIS — G4733 Obstructive sleep apnea (adult) (pediatric): Secondary | ICD-10-CM | POA: Diagnosis not present

## 2018-05-07 DIAGNOSIS — G4733 Obstructive sleep apnea (adult) (pediatric): Secondary | ICD-10-CM | POA: Diagnosis not present

## 2018-05-19 ENCOUNTER — Ambulatory Visit: Payer: Self-pay | Admitting: Nurse Practitioner

## 2018-06-06 DIAGNOSIS — G4733 Obstructive sleep apnea (adult) (pediatric): Secondary | ICD-10-CM | POA: Diagnosis not present

## 2018-06-28 ENCOUNTER — Encounter: Payer: Self-pay | Admitting: Neurology

## 2018-06-28 ENCOUNTER — Other Ambulatory Visit: Payer: Self-pay | Admitting: Neurology

## 2018-06-28 DIAGNOSIS — G4733 Obstructive sleep apnea (adult) (pediatric): Secondary | ICD-10-CM

## 2018-06-28 DIAGNOSIS — G4719 Other hypersomnia: Secondary | ICD-10-CM

## 2018-06-28 DIAGNOSIS — Z9989 Dependence on other enabling machines and devices: Secondary | ICD-10-CM

## 2018-07-22 ENCOUNTER — Other Ambulatory Visit: Payer: Self-pay | Admitting: Emergency Medicine

## 2018-07-22 MED ORDER — DULOXETINE HCL 30 MG PO CPEP: 30.0000 mg | ORAL_CAPSULE | Freq: Two times a day (BID) | ORAL | 3 refills | Status: DC

## 2018-07-22 MED ORDER — LISINOPRIL 20 MG PO TABS: 20.0000 mg | ORAL_TABLET | Freq: Every day | ORAL | 3 refills | Status: DC

## 2018-08-06 DIAGNOSIS — G4733 Obstructive sleep apnea (adult) (pediatric): Secondary | ICD-10-CM | POA: Diagnosis not present

## 2018-08-31 ENCOUNTER — Encounter: Payer: Self-pay | Admitting: Nurse Practitioner

## 2018-08-31 NOTE — Progress Notes (Signed)
[facility]  PATIENT: Patricia Mathews DOB: [birth year]   REASON FOR VISIT: Follow-up for newly diagnosed obstructive sleep apnea here for CPAP compliance HISTORY FROM: Patient    HISTORY OF PRESENT ILLNESS:UPDATE 10/24/2019CM Patricia Mathews, 57 year old female returns for follow-up for initial CPAP compliance.  She claims she is doing well and feels 70% better.  Her headaches have improved her fatigue has improved along with her daytime drowsiness.  Data dated 08/02/2018-08/31/2018 shows compliance greater than 4 hours at 97%.  Average usage 8 hours 4 minutes.  Set pressure 9 to 16 cm.  EPR level 3 leak 95th percentile at 7.  AHI 8.3 obstructive events 6.9.  ESS 2.  She returns for reevaluation 01/03/18 Patricia Mathews is a 57 y.o. female, seen here as in a referral from Dr. Jaynee Eagles for an evaluation of snoring and possible sleep apnea.  The patient sees my colleague Dr. Jaynee Eagles, who is working her up for headaches with vertigo and with established nystagmus.  She also has a stabbing pain sensation.  Is feeling pain that radiates down her spine and occurs weekly.  She has not identified triggers.  Her vertigo can also occur with and without a headache.  There is no family history of migraines.  She has noted up to 18 headache days a month.  Her husband, who is trained as an emergency room physician, has stated that she snores significantly.  She has also suffered from extreme fatigue, sleepiness, malaise and she has had drop attacks.  She was diagnosed with lupus and Sjogren's syndrome over 10 years ago while living in [place].  She moved from [place], [place] about a year ago to the [place] area with her MD husband , who is practicing as a family Loss adjuster, chartered.  She could not tolerate Plaquenil been first treated, prednisone Tapers have made her migraines worse, but she uses low-dose estrogen which helps hot flashes and helps her sleep.  She had felt some relief with Cymbalta.   She also noted dry eyes and dry mouth- but this does not necessarily feel worse in the morning.  Chief complaint according to patient :" I may have apnea".   Sleep habits are as follows: [name] reports that she may stay up watching TV until her bedtime around 10 PM if her husband goes to work next day.  If he does not have to go to work next day may actually watch Netflix up to 1 AM. She has no TV in the bedroom- she watches in her den.  Once in bed, she prays and goes promptly to sleep.  The patient's bedroom is cool, quiet and dark.  She has a bedroom with her husband, prefers to sleep on her right side and only uses one pillow.  The mattress is flat in a nonadjustable bed.  She usually is able to sleep through for several hours before she wakes,   often she does not even have a bathroom break at night. If she doesn't set her alarm and has not to get up in the morning she has no difficulties to sleep until 10 or 11 AM the next day.  She craves more sleep, wakes up achy but not tired.  Unfortunately she often has headaches in the morning this can be down the neck radiating sharp stabbing sensations or throbbing global headaches.  She usually is not nauseated, not dizzy.  Some headaches wake her out of sleep, sharp stabbing, migraine or cluster type.  REVIEW OF SYSTEMS: Full  14 system review of systems performed and notable only for those listed, all others are neg:  Constitutional: neg  Cardiovascular: neg Ear/Nose/Throat: neg  Skin: neg Eyes: neg Respiratory: neg Gastroitestinal: neg  Hematology/Lymphatic: neg  Endocrine: neg Musculoskeletal: Joint pain Allergy/Immunology: neg Neurological: neg Psychiatric: Anxiety Sleep : Obstructive sleep apnea with CPAP   ALLERGIES: Allergies  Allergen Reactions  . Penicillins Anaphylaxis    HOME MEDICATIONS: Outpatient Medications Prior to Visit  Medication Sig Dispense Refill  . cholecalciferol (VITAMIN D) 1000 units tablet Take 1,000  Units by mouth daily.    . DULoxetine (CYMBALTA) 30 MG capsule Take 1 capsule (30 mg total) by mouth 2 (two) times daily. 180 capsule 3  . lisinopril (PRINIVIL,ZESTRIL) 20 MG tablet Take 1 tablet (20 mg total) by mouth daily. 90 tablet 3  . progesterone (PROMETRIUM) 100 MG capsule Take 1 capsule (100 mg total) by mouth at bedtime. 90 capsule 4  . ALPRAZolam (XANAX) 0.25 MG tablet Take 1 tablet (0.25 mg total) by mouth 2 (two) times daily as needed for anxiety. (Patient not taking: Reported on 09/01/2018) 30 tablet 3   Facility-Administered Medications Prior to Visit  Medication Dose Route Frequency Provider Last Rate Last Dose  . gadopentetate dimeglumine (MAGNEVIST) injection 20 mL  20 mL Intravenous Once PRN Melvenia Beam, MD        PAST MEDICAL HISTORY: Past Medical History:  Diagnosis Date  . Anxiety   . Hypertension   . Lupus ([place])   . Panic attacks   . Sjogren's disease ([place])     PAST SURGICAL HISTORY: Past Surgical History:  Procedure Laterality Date  . CESAREAN SECTION     X2  . MOUTH SURGERY      FAMILY HISTORY: Family History  Problem Relation Age of Onset  . Hypertension Mother   . Osteoarthritis Mother   . Cancer Father        PROSTATE  . Hypertension Father   . Hypertension Sister   . Lupus Sister   . Cancer Brother        PROSTATE  . Lupus Brother   . Hypertension Brother   . Sarcoidosis Paternal Aunt   . Hypertension Brother   . Hypertension Brother   . Breast cancer Neg Hx     SOCIAL HISTORY: Social History   Socioeconomic History  . Marital status: Married    Spouse name: Not on file  . Number of children: 2  . Years of education: post-grad  . Highest education level: Not on file  Occupational History  . Not on file  Social Needs  . Financial resource strain: Not on file  . Food insecurity:    Worry: Not on file    Inability: Not on file  . Transportation needs:    Medical: Not on file    Non-medical: Not on file  Tobacco Use  .  Smoking status: Former Smoker    Last attempt to quit: 1989    Years since quitting: 30.8  . Smokeless tobacco: Never Used  Substance and Sexual Activity  . Alcohol use: Yes    Comment: social  . Drug use: No  . Sexual activity: Yes    Partners: Male    Comment: 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32, PARTNERS- 3, MARRIED- 67 YRS   Lifestyle  . Physical activity:    Days per week: Not on file    Minutes per session: Not on file  . Stress: Not on file  Relationships  . Social connections:  Talks on phone: Not on file    Gets together: Not on file    Attends religious service: Not on file    Active member of club or organization: Not on file    Attends meetings of clubs or organizations: Not on file    Relationship status: Not on file  . Intimate partner violence:    Fear of current or ex partner: Not on file    Emotionally abused: Not on file    Physically abused: Not on file    Forced sexual activity: Not on file  Other Topics Concern  . Not on file  Social History Narrative   Lives at home with her husband   Right handed   2 cups of caffeine daily     PHYSICAL EXAM  Vitals:   09/01/18 0807  BP: (!) 157/92  Pulse: 68  Weight: 209 lb 3.2 oz (94.9 kg)  Height: 5\' 5"  (1.651 m)   Body mass index is 34.81 kg/m.  Generalized: Well developed, obese female in no acute distress  Head: normocephalic and atraumatic,. Oropharynx benign mallopatti4 Neck: Supple, circumference 14.5 Lungs clear Musculoskeletal: No deformity  Skin no rash or edema Neurological examination   Mentation: Alert oriented to time, place, history taking. Attention span and concentration appropriate. Recent and remote memory intact.  Follows all commands speech and language fluent.   Cranial nerve II-XII: Pupils were equal round reactive to light extraocular movements were full, visual field were full on confrontational test. Facial sensation and strength were normal. hearing was intact to finger rubbing  bilaterally. Uvula tongue midline. head turning and shoulder shrug were normal and symmetric.Tongue protrusion into cheek strength was normal. Motor: normal bulk and tone, full strength in the BUE, BLE, Sensory: normal and symmetric to light touch,  Coordination: finger-nose-finger, heel-to-shin bilaterally, no dysmetria Gait and Station: Rising up from seated position without assistance, normal stance,  moderate stride, good arm swing, smooth turning, able to perform tiptoe, and heel walking without difficulty. Tandem gait is steady  DIAGNOSTIC DATA (LABS, IMAGING, TESTING) - I reviewed patient records, labs, notes, testing and imaging myself where available.  Lab Results  Component Value Date   WBC 6.0 01/26/2018   HGB 14.1 01/26/2018   HCT 42.5 01/26/2018   MCV 94.3 01/26/2018   PLT 370 01/26/2018      Component Value Date/Time   NA 139 01/26/2018 1245   K 4.2 01/26/2018 1245   CL 103 01/26/2018 1245   CO2 27 01/26/2018 1245   GLUCOSE 99 01/26/2018 1245   BUN 15 01/26/2018 1245   CREATININE 0.79 01/26/2018 1245   CALCIUM 10.1 01/26/2018 1245   PROT 8.3 01/26/2018 1245   ALBUMIN 4.2 01/26/2018 1245   AST 18 01/26/2018 1245   ALT 21 01/26/2018 1245   ALKPHOS 86 01/26/2018 1245   BILITOT 0.5 01/26/2018 1245   GFRNONAA >60 01/26/2018 1245   GFRAA >60 01/26/2018 1245   ASSESSMENT AND PLAN  57 y.o. year old female  has a past medical history of Anxiety, Hypertension, Lupus ([place]), Panic attacks, and Sjogren's disease ([place]). here with newly diagnosed obstructive sleep apnea here for initial CPAP compliance.Data dated 08/02/2018-08/31/2018 shows compliance greater than 4 hours at 97%.  Average usage 8 hours 4 minutes.  Set pressure 9 to 16 cm.  EPR level 3 leak 95th percentile at 7.  AHI 8.3 obstructive events 6.9.  ESS 2.    PLAN: CPAP compliance 97% AHI elevated at 8.3 We will increase max pressure to  17 Follow-up in 3 months Dennie Bible, [facility], [facility], APRN  [facility] [address] [place], [place] [address] [phone]

## 2018-09-01 ENCOUNTER — Encounter

## 2018-09-01 ENCOUNTER — Ambulatory Visit (INDEPENDENT_AMBULATORY_CARE_PROVIDER_SITE_OTHER): Payer: 59 | Admitting: Nurse Practitioner

## 2018-09-01 ENCOUNTER — Encounter: Payer: Self-pay | Admitting: Nurse Practitioner

## 2018-09-01 DIAGNOSIS — G4733 Obstructive sleep apnea (adult) (pediatric): Secondary | ICD-10-CM | POA: Diagnosis not present

## 2018-09-01 DIAGNOSIS — Z9989 Dependence on other enabling machines and devices: Secondary | ICD-10-CM | POA: Diagnosis not present

## 2018-09-01 NOTE — Patient Instructions (Signed)
CPAP compliance 97% AHI elevated at 8.3 We will increase max pressure to 17 Follow-up in 3 months

## 2018-09-01 NOTE — Progress Notes (Signed)
New cpap order to [facility]. Burroughs, Aleatha Borer, RN  got it! thank you!

## 2018-09-14 ENCOUNTER — Other Ambulatory Visit: Payer: Self-pay | Admitting: Emergency Medicine

## 2018-09-14 DIAGNOSIS — Z1231 Encounter for screening mammogram for malignant neoplasm of breast: Secondary | ICD-10-CM

## 2018-09-15 ENCOUNTER — Other Ambulatory Visit: Payer: Self-pay | Admitting: Emergency Medicine

## 2018-09-15 MED ORDER — LISINOPRIL 20 MG PO TABS: 20.0000 mg | ORAL_TABLET | Freq: Two times a day (BID) | ORAL | 3 refills | Status: DC

## 2018-09-15 MED ORDER — CEFUROXIME AXETIL 500 MG PO TABS: 500.0000 mg | ORAL_TABLET | Freq: Two times a day (BID) | ORAL | 2 refills | Status: AC

## 2018-09-15 MED ORDER — DULOXETINE HCL 30 MG PO CPEP: 30.0000 mg | ORAL_CAPSULE | Freq: Two times a day (BID) | ORAL | 3 refills | Status: DC

## 2018-09-15 MED ORDER — CIPROFLOXACIN HCL 500 MG PO TABS: 500.0000 mg | ORAL_TABLET | Freq: Two times a day (BID) | ORAL | 2 refills | Status: DC

## 2018-09-28 ENCOUNTER — Other Ambulatory Visit: Payer: Self-pay | Admitting: Physician Assistant

## 2018-09-28 MED ORDER — DULOXETINE HCL 30 MG PO CPEP: 30.0000 mg | ORAL_CAPSULE | Freq: Two times a day (BID) | ORAL | 3 refills | Status: DC

## 2018-09-28 MED FILL — DULoxetine HCL 30 MG CPEP: 30 | 90 days supply | Qty: 180 | Fill #0

## 2018-09-28 NOTE — Progress Notes (Signed)
Meds ordered this encounter  Medications  . DULoxetine (CYMBALTA) 30 MG capsule    Sig: Take 1 capsule (30 mg total) by mouth 2 (two) times daily.    Dispense:  180 capsule    Refill:  3    Order Specific Question:   Supervising Provider    Answer:   Horald Pollen [phone]

## 2018-10-28 ENCOUNTER — Ambulatory Visit
Admission: RE | Admit: 2018-10-28 | Discharge: 2018-10-28 | Disposition: A | Discharge status: Home / Self Care | Payer: 59 | Source: Ambulatory Visit | Attending: Emergency Medicine | Admitting: Emergency Medicine

## 2018-10-28 ENCOUNTER — Encounter: Payer: Self-pay | Admitting: Emergency Medicine

## 2018-10-28 DIAGNOSIS — Z1231 Encounter for screening mammogram for malignant neoplasm of breast: Secondary | ICD-10-CM | POA: Diagnosis not present

## 2018-12-15 ENCOUNTER — Ambulatory Visit: Payer: 59 | Admitting: Nurse Practitioner

## 2018-12-29 MED FILL — DULoxetine HCL 30 MG CPEP: 30 | 90 days supply | Qty: 180 | Fill #1 | Status: TO

## 2019-01-02 ENCOUNTER — Other Ambulatory Visit: Payer: Self-pay | Admitting: Emergency Medicine

## 2019-01-02 MED ORDER — CEPHALEXIN 500 MG PO CAPS: 500.0000 mg | ORAL_CAPSULE | Freq: Three times a day (TID) | ORAL | 0 refills | Status: AC

## 2019-01-02 MED ORDER — LISINOPRIL 20 MG PO TABS: 20.0000 mg | ORAL_TABLET | Freq: Two times a day (BID) | ORAL | 3 refills | Status: DC

## 2019-01-02 MED ORDER — DOXYCYCLINE HYCLATE 100 MG PO TABS: 100.0000 mg | ORAL_TABLET | Freq: Two times a day (BID) | ORAL | 0 refills | Status: DC

## 2019-01-11 DIAGNOSIS — G4733 Obstructive sleep apnea (adult) (pediatric): Secondary | ICD-10-CM | POA: Diagnosis not present

## 2019-01-17 ENCOUNTER — Encounter: Payer: 59 | Admitting: Obstetrics & Gynecology

## 2019-01-30 ENCOUNTER — Ambulatory Visit: Payer: Commercial Managed Care - PPO | Admitting: Nurse Practitioner

## 2019-03-02 ENCOUNTER — Other Ambulatory Visit: Payer: Self-pay | Admitting: Emergency Medicine

## 2019-03-23 ENCOUNTER — Encounter: Payer: Commercial Managed Care - PPO | Admitting: Obstetrics & Gynecology

## 2019-03-28 ENCOUNTER — Other Ambulatory Visit: Payer: Self-pay | Admitting: Emergency Medicine

## 2019-03-29 ENCOUNTER — Other Ambulatory Visit: Payer: Self-pay | Admitting: Emergency Medicine

## 2019-05-26 MED FILL — DULoxetine HCL 30 MG CPEP: 30 | 90 days supply | Qty: 180 | Fill #0

## 2019-06-15 ENCOUNTER — Other Ambulatory Visit: Payer: Self-pay | Admitting: Emergency Medicine

## 2019-06-15 MED ORDER — DOXYCYCLINE HYCLATE 100 MG PO TABS: 100.0000 mg | ORAL_TABLET | Freq: Two times a day (BID) | ORAL | 0 refills | Status: DC

## 2019-06-15 MED ORDER — LISINOPRIL 20 MG PO TABS: 20.0000 mg | ORAL_TABLET | Freq: Two times a day (BID) | ORAL | 3 refills | Status: DC

## 2019-08-11 ENCOUNTER — Other Ambulatory Visit: Payer: Self-pay | Admitting: Emergency Medicine

## 2019-08-11 DIAGNOSIS — I10 Essential (primary) hypertension: Secondary | ICD-10-CM | POA: Diagnosis not present

## 2019-08-11 DIAGNOSIS — M329 Systemic lupus erythematosus, unspecified: Secondary | ICD-10-CM | POA: Diagnosis not present

## 2019-08-12 LAB — LIPID PANEL W/O CHOL/HDL RATIO
Cholesterol, Total: 250 mg/dL — ABNORMAL HIGH (ref 100–199)
HDL: 82 mg/dL (ref >39)
LDL Chol Calc (NIH): 156 mg/dL — ABNORMAL HIGH (ref 0–99)
Triglycerides: 70 mg/dL (ref 0–149)
VLDL Cholesterol Cal: 12 mg/dL (ref 5–40)

## 2019-08-12 LAB — COMPREHENSIVE METABOLIC PANEL WITH GFR
ALT: 22 IU/L (ref 0–32)
AST: 20 IU/L (ref 0–40)
Albumin/Globulin Ratio: 1.5 (ref 1.2–2.2)
Albumin: 4.4 g/dL (ref 3.8–4.9)
Alkaline Phosphatase: 93 IU/L (ref 39–117)
BUN/Creatinine Ratio: 19 (ref 9–23)
BUN: 13 mg/dL (ref 6–24)
Bilirubin Total: 0.4 mg/dL (ref 0.0–1.2)
CO2: 23 mmol/L (ref 20–29)
Calcium: 9.7 mg/dL (ref 8.7–10.2)
Chloride: 102 mmol/L (ref 96–106)
Creatinine, Ser: 0.68 mg/dL (ref 0.57–1.00)
GFR calc Af Amer: 111 mL/min/1.73 (ref >59)
GFR calc non Af Amer: 96 mL/min/1.73 (ref >59)
Globulin, Total: 3 g/dL (ref 1.5–4.5)
Glucose: 96 mg/dL (ref 65–99)
Potassium: 4.6 mmol/L (ref 3.5–5.2)
Sodium: 139 mmol/L (ref 134–144)
Total Protein: 7.4 g/dL (ref 6.0–8.5)

## 2019-08-12 LAB — TSH: TSH: 0.582 u[IU]/mL (ref 0.450–4.500)

## 2019-08-12 LAB — CBC WITH DIFFERENTIAL/PLATELET
Basophils Absolute: 0 x10E3/uL (ref 0.0–0.2)
Basos: 1 %
EOS (ABSOLUTE): 0.1 x10E3/uL (ref 0.0–0.4)
Eos: 2 %
Hematocrit: 42.1 % (ref 34.0–46.6)
Hemoglobin: 14.1 g/dL (ref 11.1–15.9)
Immature Grans (Abs): 0 x10E3/uL (ref 0.0–0.1)
Immature Granulocytes: 0 %
Lymphocytes Absolute: 1.7 x10E3/uL (ref 0.7–3.1)
Lymphs: 30 %
MCH: 31.5 pg (ref 26.6–33.0)
MCHC: 33.5 g/dL (ref 31.5–35.7)
MCV: 94 fL (ref 79–97)
Monocytes Absolute: 0.5 x10E3/uL (ref 0.1–0.9)
Monocytes: 9 %
Neutrophils Absolute: 3.2 x10E3/uL (ref 1.4–7.0)
Neutrophils: 58 %
Platelets: 362 x10E3/uL (ref 150–450)
RBC: 4.48 x10E6/uL (ref 3.77–5.28)
RDW: 13 % (ref 11.7–15.4)
WBC: 5.5 x10E3/uL (ref 3.4–10.8)

## 2019-08-12 LAB — SEDIMENTATION RATE: Sed Rate: 40 mm/hr (ref 0–40)

## 2019-08-12 LAB — C-REACTIVE PROTEIN: CRP: 11 mg/L — ABNORMAL HIGH (ref 0–10)

## 2019-08-12 LAB — EUROIMMUN SARS-COV-2 AB, IGG: Euroimmun SARS-CoV-2 Ab, IgG: NEGATIVE

## 2019-08-12 LAB — SAR COV2 SEROLOGY (COVID19)AB(IGG),IA

## 2019-08-12 LAB — HGB A1C W/O EAG: Hgb A1c MFr Bld: 5.7 % — ABNORMAL HIGH (ref 4.8–5.6)

## 2019-08-24 ENCOUNTER — Other Ambulatory Visit: Payer: Self-pay | Admitting: Emergency Medicine

## 2019-08-24 MED ORDER — DULOXETINE HCL 30 MG PO CPEP: 30.0000 mg | ORAL_CAPSULE | Freq: Two times a day (BID) | ORAL | 3 refills | Status: DC

## 2019-08-24 MED FILL — DULoxetine HCL 30 MG CPEP: 30 | 90 days supply | Qty: 180 | Fill #0

## 2019-09-20 DIAGNOSIS — G4733 Obstructive sleep apnea (adult) (pediatric): Secondary | ICD-10-CM | POA: Diagnosis not present

## 2019-10-16 ENCOUNTER — Other Ambulatory Visit: Payer: Self-pay | Admitting: Family Medicine

## 2019-10-16 MED ORDER — LOSARTAN POTASSIUM 100 MG PO TABS: 100.0000 mg | ORAL_TABLET | Freq: Every day | ORAL | 0 refills | Status: DC

## 2019-10-16 MED FILL — LOSARTAN POTASSIUM 100 MG T: 100 | 90 days supply | Qty: 90 | Fill #0

## 2019-10-19 ENCOUNTER — Other Ambulatory Visit: Payer: Self-pay | Admitting: *Deleted

## 2019-10-19 DIAGNOSIS — R1032 Left lower quadrant pain: Secondary | ICD-10-CM

## 2019-10-27 ENCOUNTER — Ambulatory Visit: Payer: 59 | Attending: Internal Medicine

## 2019-10-27 DIAGNOSIS — Z20822 Contact with and (suspected) exposure to covid-19: Secondary | ICD-10-CM

## 2019-10-27 DIAGNOSIS — Z20828 Contact with and (suspected) exposure to other viral communicable diseases: Secondary | ICD-10-CM | POA: Diagnosis not present

## 2019-10-28 LAB — NOVEL CORONAVIRUS, NAA: SARS-CoV-2, NAA: NOT DETECTED

## 2019-11-01 ENCOUNTER — Ambulatory Visit
Admission: RE | Admit: 2019-11-01 | Discharge: 2019-11-01 | Disposition: A | Discharge status: Home / Self Care | Payer: 59 | Source: Ambulatory Visit | Attending: Family Medicine | Admitting: Family Medicine

## 2019-11-01 ENCOUNTER — Other Ambulatory Visit: Payer: Self-pay

## 2019-11-01 ENCOUNTER — Other Ambulatory Visit: Payer: 59

## 2019-11-01 DIAGNOSIS — R1032 Left lower quadrant pain: Secondary | ICD-10-CM

## 2019-11-01 DIAGNOSIS — K5900 Constipation, unspecified: Secondary | ICD-10-CM | POA: Diagnosis not present

## 2019-11-01 MED ORDER — IOPAMIDOL (ISOVUE-300) INJECTION 61%
75.0000 mL | Freq: Once | INTRAVENOUS | Status: AC | PRN
  Administered 2019-11-01: 100 mL via INTRAVENOUS

## 2019-11-30 MED FILL — DULoxetine HCL 30 MG CPEP: 30 | 90 days supply | Qty: 180 | Fill #1

## 2019-12-15 ENCOUNTER — Ambulatory Visit: Payer: 59 | Attending: Internal Medicine

## 2019-12-15 DIAGNOSIS — Z20822 Contact with and (suspected) exposure to covid-19: Secondary | ICD-10-CM | POA: Diagnosis not present

## 2019-12-16 LAB — NOVEL CORONAVIRUS, NAA: SARS-CoV-2, NAA: NOT DETECTED

## 2019-12-21 ENCOUNTER — Encounter: Payer: Self-pay | Admitting: *Deleted

## 2019-12-22 ENCOUNTER — Telehealth (INDEPENDENT_AMBULATORY_CARE_PROVIDER_SITE_OTHER): Payer: 59 | Admitting: Family Medicine

## 2019-12-22 ENCOUNTER — Other Ambulatory Visit: Payer: Self-pay

## 2019-12-22 DIAGNOSIS — M328 Other forms of systemic lupus erythematosus: Secondary | ICD-10-CM | POA: Diagnosis not present

## 2019-12-22 DIAGNOSIS — F411 Generalized anxiety disorder: Secondary | ICD-10-CM

## 2019-12-22 DIAGNOSIS — Z9989 Dependence on other enabling machines and devices: Secondary | ICD-10-CM

## 2019-12-22 DIAGNOSIS — M3500 Sicca syndrome, unspecified: Secondary | ICD-10-CM

## 2019-12-22 DIAGNOSIS — I10 Essential (primary) hypertension: Secondary | ICD-10-CM

## 2019-12-22 DIAGNOSIS — G4733 Obstructive sleep apnea (adult) (pediatric): Secondary | ICD-10-CM | POA: Diagnosis not present

## 2019-12-22 MED ORDER — LOSARTAN POTASSIUM 100 MG PO TABS: 100.0000 mg | ORAL_TABLET | Freq: Every day | ORAL | 1 refills | Status: DC

## 2019-12-22 MED ORDER — ALPRAZOLAM 0.25 MG PO TABS: 0.2500 mg | ORAL_TABLET | Freq: Two times a day (BID) | ORAL | 2 refills | Status: DC | PRN

## 2019-12-22 MED FILL — ALPRAZolam 0.25 MG TABS: 0.25 | 15 days supply | Qty: 30 | Fill #0

## 2019-12-22 NOTE — Progress Notes (Signed)
Virtual Visit Note  I connected with patient on 12/22/19 at 322pm by video doximity and verified that I am speaking with the correct person using two identifiers. Patricia Mathews is currently located at home and patient is currently with them during visit. The provider, Rutherford Guys, MD is located in their office at time of visit.  I discussed the limitations, risks, security and privacy concerns of performing an evaluation and management service by telephone and the availability of in person appointments. I also discussed with the patient that there may be a patient responsible charge related to this service. The patient expressed understanding and agreed to proceed.   CC: establish care  HPI PMH: OSA on cpap, lupus, sjorgens, HTN?  Rheum - Dr Trudie Reed, saw once, would be requesting for referral once covid calms down Sleep - [facility], uses cpap every night Gyn - Dr Pola Corn, due for visit Xanax for flare ups, anxiety during time when she would have had her menses  Takes 1-2 a day for about 6 days a month pmp reviewed Duloxetine for arthralgia, fatigue a/w lupus and sjorgens Denies any end organ damage Currently not on DMARDs, has tried plaquenil in the past which was not very helpful Strong fhx autoimmune disorders BP well controlled on losartan R/o amylodosis in 2019 Due for colonoscopy a has h/o polyps peloton 30 mins 3 x week Daily 6000 - 8000 steps She has no acute concerns today  Allergies  Allergen Reactions  . Penicillins Anaphylaxis    Prior to Admission medications   Medication Sig Start Date End Date Taking? Authorizing Provider  ALPRAZolam (XANAX) 0.25 MG tablet Take 1 tablet (0.25 mg total) by mouth 2 (two) times daily as needed for anxiety. 12/22/19   Rutherford Guys, MD  cholecalciferol (VITAMIN D) 1000 units tablet Take 1,000 Units by mouth daily.    [provider]  DULoxetine (CYMBALTA) 30 MG capsule Take 1 capsule (30 mg total) by mouth 2 (two) times  daily. 08/24/19 11/22/19  Horald Pollen, MD  losartan (COZAAR) 100 MG tablet Take 1 tablet (100 mg total) by mouth daily. 12/22/19   Rutherford Guys, MD    Past Medical History:  Diagnosis Date  . Anxiety   . Hypertension   . Lupus ([place])   . Panic attacks   . Sjogren's disease [facility])     Past Surgical History:  Procedure Laterality Date  . CESAREAN SECTION     X2  . MOUTH SURGERY      Social History   Tobacco Use  . Smoking status: Former Smoker    Quit date: 1989    Years since quitting: 32.1  . Smokeless tobacco: Never Used  Substance Use Topics  . Alcohol use: Yes    Comment: social    Family History  Problem Relation Age of Onset  . Hypertension Mother   . Osteoarthritis Mother   . Cancer Father        PROSTATE  . Hypertension Father   . Hypertension Sister   . Lupus Sister   . Cancer Brother        PROSTATE  . Lupus Brother   . Hypertension Brother   . Sarcoidosis Paternal Aunt   . Hypertension Brother   . Hypertension Brother   . Breast cancer Neg Hx     Review of Systems  Constitutional: Negative for chills and fever.  Respiratory: Negative for cough and shortness of breath.   Cardiovascular: Negative for chest pain, palpitations  and leg swelling.  Gastrointestinal: Negative for abdominal pain, nausea and vomiting.  per hpi  Objective  Vitals as reported by the patient: 132/72, 97 Temp, 98% RA, 5'6", 190lbs  GEN: AAOx3, NAD HEENT: [place]/AT, pupils are symmetrical, EOMI, non-icteric sclera Resp: breathing comfortably, speaking in full sentences Skin: no rashes noted, no pallor Psych: good eye contact, normal mood and affect   ASSESSMENT and PLAN  1. Essential hypertension, benign Controlled. Continue current regime.   2. Other forms of systemic lupus erythematosus, unspecified organ involvement status ([place]) 3. Sjogren's syndrome, with unspecified organ involvement ([place]) Stable. Doing well on cymbalta for symptom management  4.  Obstructive sleep apnea treated with continuous positive airway pressure (CPAP) On nightly cpap, managed by sleep  5. GAD (generalized anxiety disorder) pmp reviewed. Takes prn, med refilled  Other orders - ALPRAZolam (XANAX) 0.25 MG tablet; Take 1 tablet (0.25 mg total) by mouth 2 (two) times daily as needed for anxiety. - losartan (COZAAR) 100 MG tablet; Take 1 tablet (100 mg total) by mouth daily.  FOLLOW-UP: 6 months   The above assessment and management plan was discussed with the patient. The patient verbalized understanding of and has agreed to the management plan. Patient is aware to call the clinic if symptoms persist or worsen. Patient is aware when to return to the clinic for a follow-up visit. Patient educated on when it is appropriate to go to the emergency department.    I provided 18 minutes of non-face-to-face time during this encounter.  Rutherford Guys, MD Primary Care at [place], [place] [address] Ph.  [phone] Fax [phone]

## 2019-12-22 NOTE — Progress Notes (Signed)
Pt is establishing care, not having really medical concerns today. She does have lupus and anxiety, gad 7 completed score 8. Pharmacy and medication verified. Anxiety med given by her gyn, she does not remember the name.

## 2019-12-25 DIAGNOSIS — I10 Essential (primary) hypertension: Secondary | ICD-10-CM | POA: Insufficient documentation

## 2019-12-25 DIAGNOSIS — M328 Other forms of systemic lupus erythematosus: Secondary | ICD-10-CM | POA: Insufficient documentation

## 2019-12-25 DIAGNOSIS — F411 Generalized anxiety disorder: Secondary | ICD-10-CM | POA: Insufficient documentation

## 2019-12-25 DIAGNOSIS — M3500 Sicca syndrome, unspecified: Secondary | ICD-10-CM | POA: Insufficient documentation

## 2019-12-29 MED FILL — LOSARTAN POTASSIUM 100 MG T: 100 | 90 days supply | Qty: 90 | Fill #0

## 2020-01-17 ENCOUNTER — Other Ambulatory Visit: Payer: Self-pay | Admitting: *Deleted

## 2020-01-17 DIAGNOSIS — Z1211 Encounter for screening for malignant neoplasm of colon: Secondary | ICD-10-CM

## 2020-01-18 ENCOUNTER — Telehealth: Payer: Self-pay | Admitting: *Deleted

## 2020-01-18 NOTE — Telephone Encounter (Signed)
Faxed Cologuard requisition to [facility]. Confirmation page 8:09 am.

## 2020-02-02 ENCOUNTER — Ambulatory Visit: Payer: 59 | Attending: Internal Medicine

## 2020-02-02 DIAGNOSIS — Z23 Encounter for immunization: Secondary | ICD-10-CM

## 2020-02-02 NOTE — Progress Notes (Signed)
   Covid-19 Vaccination Clinic  Name:  Patricia Mathews    MRN: [id number] DOB: Jul 26, 1961  02/02/2020  Ms. Nephew was observed post Covid-19 immunization for 15 minutes without incident. She was provided with Vaccine Information Sheet and instruction to access the V-Safe system.   Ms. Salminen was instructed to call 911 with any severe reactions post vaccine: Marland Kitchen Difficulty breathing  . Swelling of face and throat  . A fast heartbeat  . A bad rash all over body  . Dizziness and weakness   Immunizations Administered    Name Date Dose VIS Date Route   Pfizer COVID-19 Vaccine 02/02/2020 11:06 AM 0.3 mL 10/20/2019 Intramuscular   Manufacturer: [place]   Lot: [id number]   [place]: [id number]

## 2020-02-12 DIAGNOSIS — Z1211 Encounter for screening for malignant neoplasm of colon: Secondary | ICD-10-CM | POA: Diagnosis not present

## 2020-02-22 LAB — COLOGUARD: Cologuard: NEGATIVE

## 2020-02-26 ENCOUNTER — Other Ambulatory Visit: Payer: Self-pay | Admitting: Family Medicine

## 2020-02-26 ENCOUNTER — Ambulatory Visit: Payer: 59 | Attending: Internal Medicine

## 2020-02-26 DIAGNOSIS — Z23 Encounter for immunization: Secondary | ICD-10-CM

## 2020-02-26 MED ORDER — LOSARTAN POTASSIUM-HCTZ 50-12.5 MG PO TABS: 1.0000 | ORAL_TABLET | Freq: Every day | ORAL | 1 refills | Status: DC

## 2020-02-26 MED FILL — LOSARTAN-HCTZ 50-12.5 MG TA: 50-12.5 | 90 days supply | Qty: 90 | Fill #0

## 2020-02-26 NOTE — Progress Notes (Signed)
   Covid-19 Vaccination Clinic  Name:  YATZIRY DEAKINS    MRN: [id number] DOB: 24-Feb-1961  02/26/2020  Ms. Wachtel was observed post Covid-19 immunization for 15 minutes without incident. She was provided with Vaccine Information Sheet and instruction to access the V-Safe system.   Ms. Helser was instructed to call 911 with any severe reactions post vaccine: Marland Kitchen Difficulty breathing  . Swelling of face and throat  . A fast heartbeat  . A bad rash all over body  . Dizziness and weakness   Immunizations Administered    Name Date Dose VIS Date Route   Pfizer COVID-19 Vaccine 02/26/2020  2:44 PM 0.3 mL 01/03/2019 Intramuscular   Manufacturer: [place]   Lot: [id number]   [place]: [id number]

## 2020-03-01 IMAGING — CT CT ABD-PELV W/ CM
1 of 3 series · 13 of 32 positions shown, 19 images · IV contrast (APPLIED)
Comparison: None.

CLINICAL DATA: Left lower quadrant pain, diverticulitis suspected

EXAM:
CT ABDOMEN AND PELVIS WITH CONTRAST
TECHNIQUE: Multidetector CT imaging of the abdomen and pelvis was performed
using the standard protocol following bolus administration of
intravenous contrast.
CONTRAST:  100mL QO5BIT-R22 IOPAMIDOL (QO5BIT-R22) INJECTION 61%

[Series 2: abd/pelvis w/cm · axial · 0.92mm/px · z∈[-401,-1]mm · 13 of 94 slices shown, 19 images]
[im 7/94  soft-tissue]
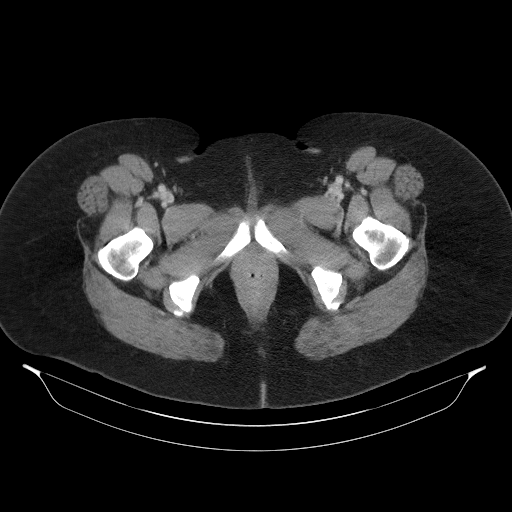
[im 7/94  bone]
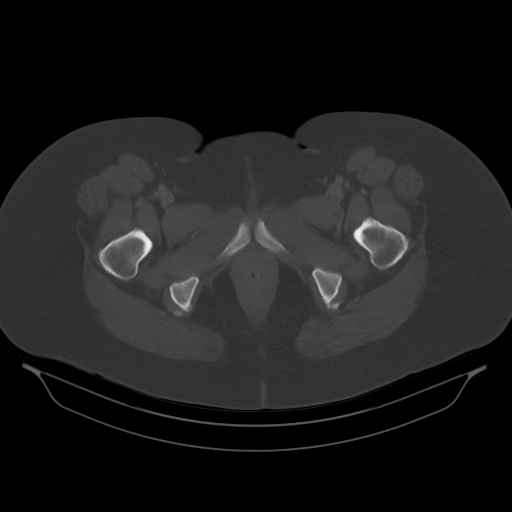
[im 14/94  soft-tissue]
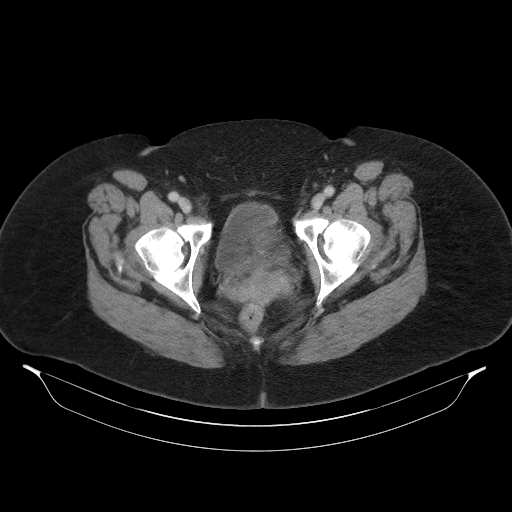
[im 20/94  soft-tissue]
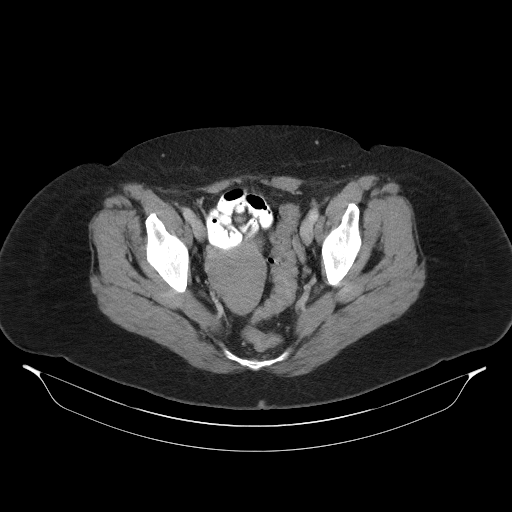
[im 27/94  soft-tissue]
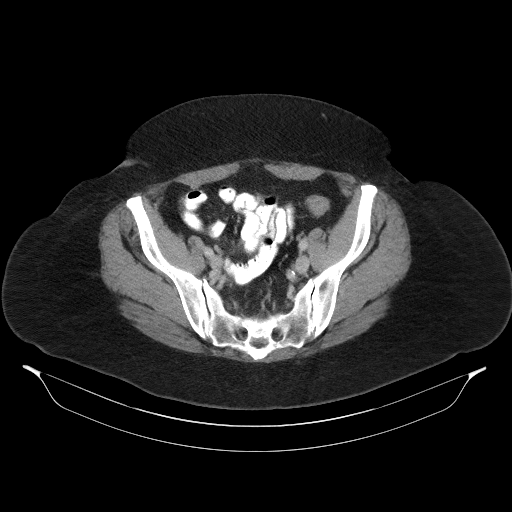
[im 34/94  soft-tissue]
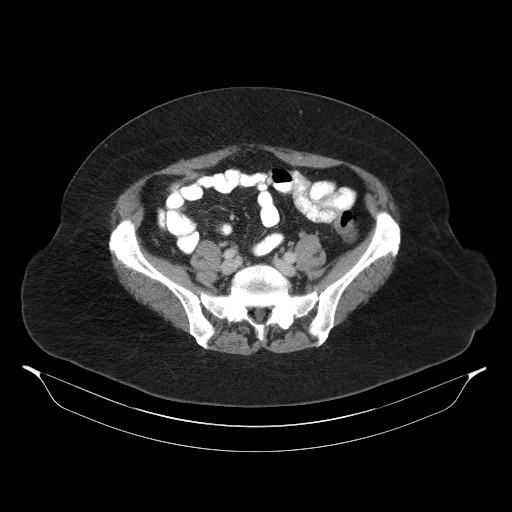
[im 40/94  soft-tissue]
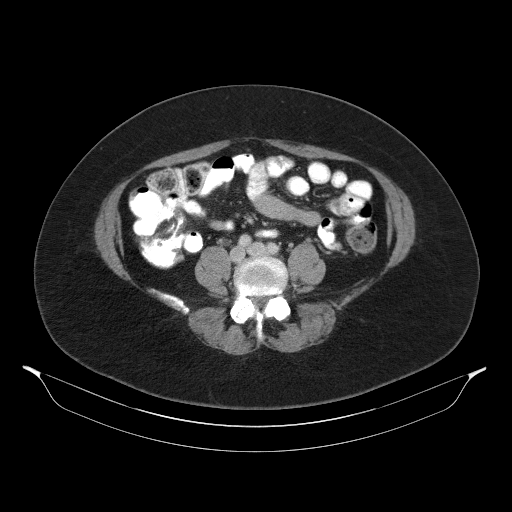
[im 47/94  soft-tissue]
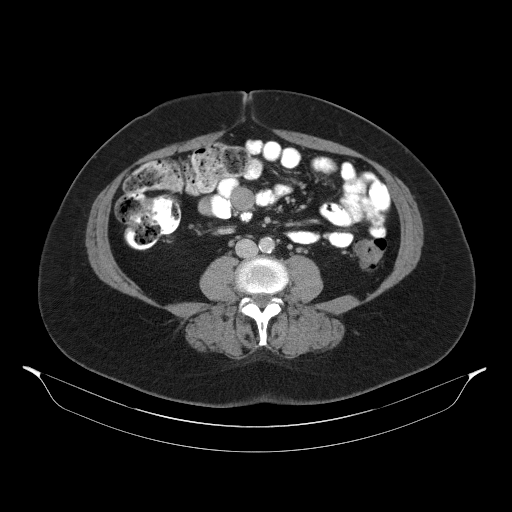
[im 54/94  soft-tissue]
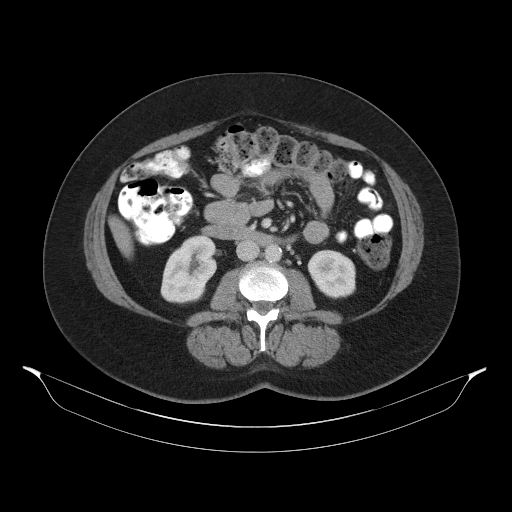
[im 60/94  soft-tissue]
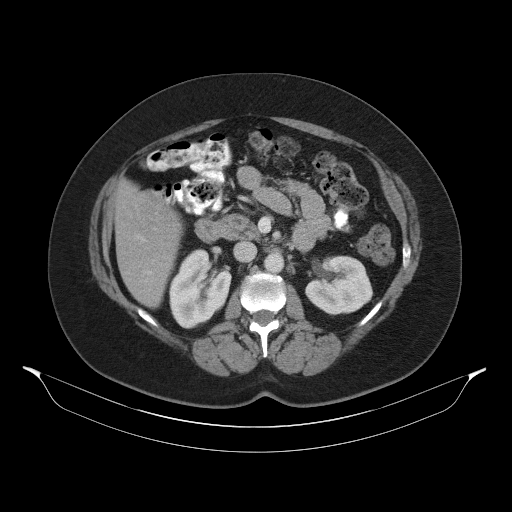
[im 60/94  bone]
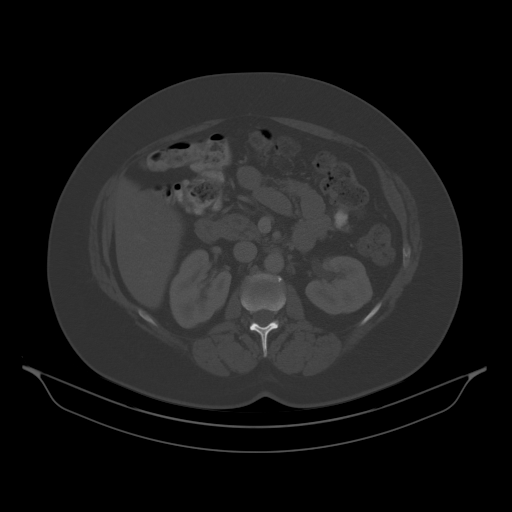
[im 67/94  soft-tissue]
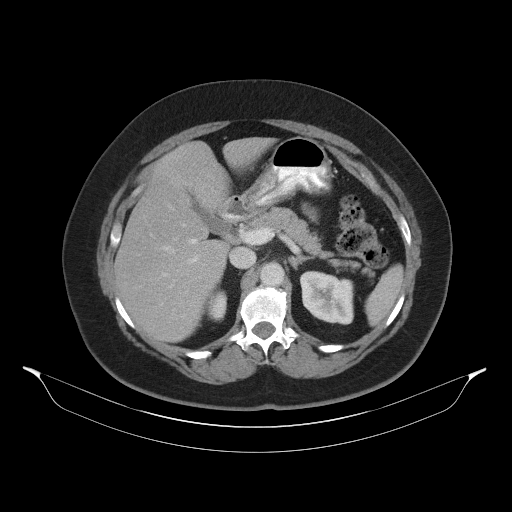
[im 67/94  lung]
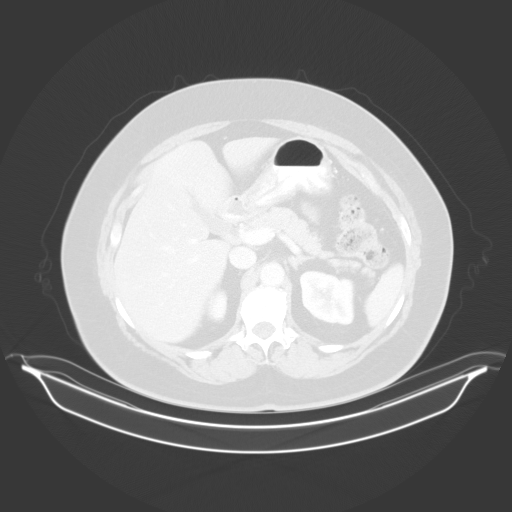
[im 74/94  soft-tissue]
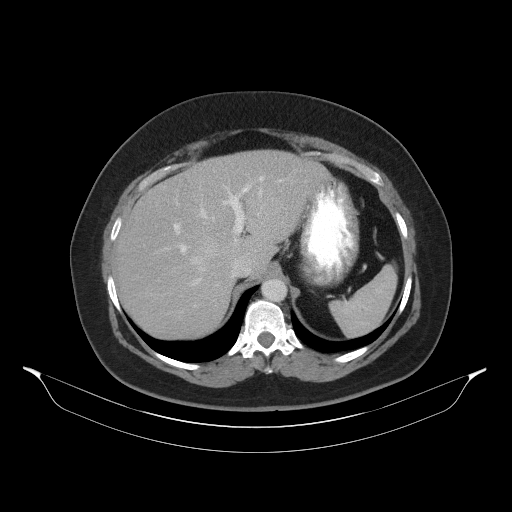
[im 74/94  lung]
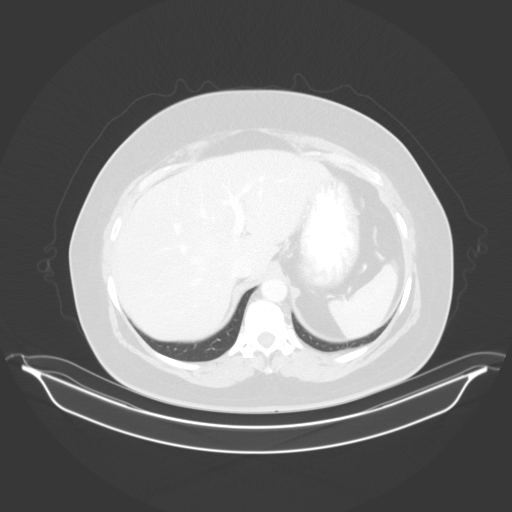
[im 80/94  soft-tissue]
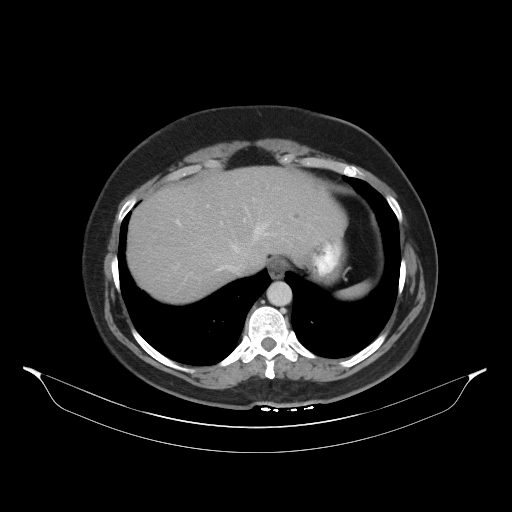
[im 80/94  lung]
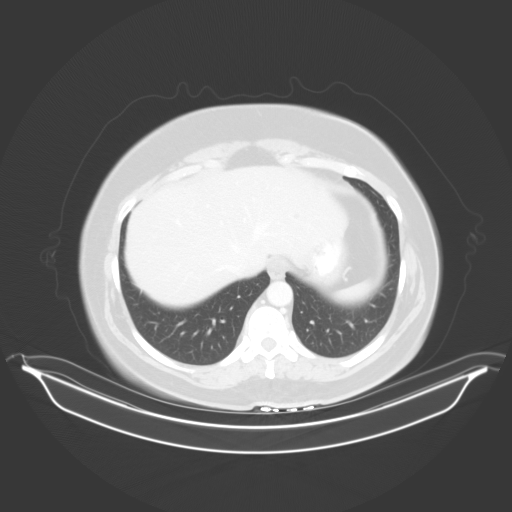
[im 87/94  soft-tissue]
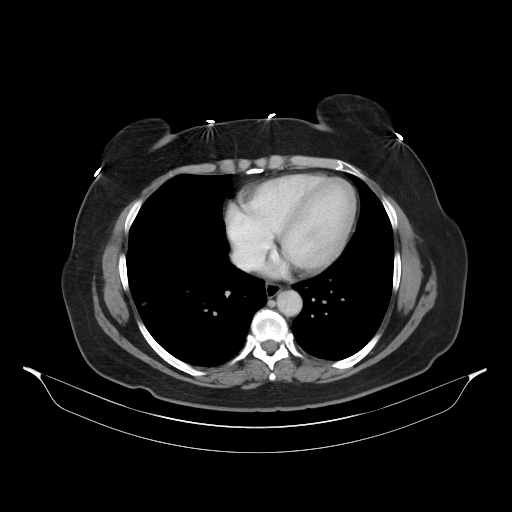
[im 87/94  lung]
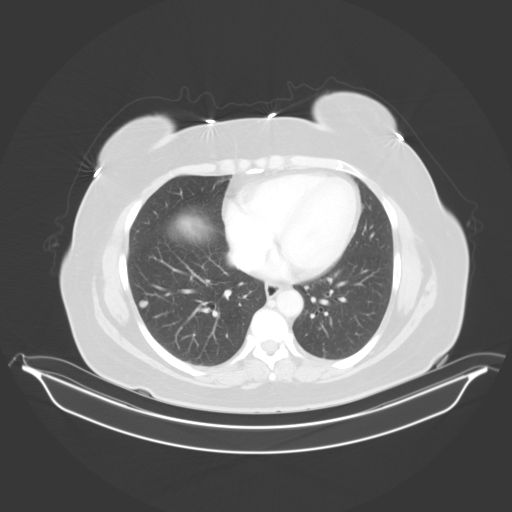

[13 of 32 positions shown; findings below may reference images not displayed]

FINDINGS: Lower chest: 8 mm right lower lobe nodule. Lung bases otherwise
clear. No effusions. Heart is normal size.

Hepatobiliary: Tiny scattered hypodensities in the liver, likely
small cysts. No biliary ductal dilatation. Gallbladder unremarkable.

Pancreas: No focal abnormality or ductal dilatation.

Spleen: No focal abnormality.  Normal size.

Adrenals/Urinary Tract: No adrenal abnormality. No focal renal
abnormality. No stones or hydronephrosis. Urinary bladder is
unremarkable.

Stomach/Bowel: Moderate stool burden in the colon. No diverticula
seen. No evidence of diverticulitis or colitis. Stomach and small
bowel decompressed, unremarkable.

Vascular/Lymphatic: Aortic atherosclerosis. No enlarged abdominal or
pelvic lymph nodes.

Reproductive: Probable exophytic 6.4 cm fibroid off the posterior
uterus. No adnexal masses.

Other: No free fluid or free air.

Musculoskeletal: No acute bony abnormality.
IMPRESSION: No evidence of diverticulosis or diverticulitis.

6.4 cm exophytic soft tissue off the posterior uterus, likely
fibroid.

Scattered aortic atherosclerosis.

8 mm right lower lobe nodule. Non-contrast chest CT at 6-12 months
is recommended. If the nodule is stable at time of repeat CT, then
future CT at 18-24 months (from today's scan) is considered optional
for low-risk patients, but is recommended for high-risk patients.
This recommendation follows the consensus statement: Guidelines for
Management of Incidental Pulmonary Nodules Detected on CT Images:

## 2020-04-05 MED FILL — ALPRAZolam 0.25 MG TABS: 0.25 | 15 days supply | Qty: 30 | Fill #1

## 2020-04-12 MED FILL — DULoxetine HCL 30 MG CPEP: 30 | 90 days supply | Qty: 180 | Fill #2

## 2020-05-23 MED FILL — ALPRAZolam 0.25 MG TABS: 0.25 | 15 days supply | Qty: 30 | Fill #2

## 2020-05-30 MED FILL — LOSARTAN-HCTZ 50-12.5 MG TA: 50-12.5 | 90 days supply | Qty: 90 | Fill #1

## 2020-07-16 ENCOUNTER — Encounter: Payer: Self-pay | Admitting: Family Medicine

## 2020-07-18 ENCOUNTER — Other Ambulatory Visit: Payer: Self-pay | Admitting: Family Medicine

## 2020-07-18 MED ORDER — ALPRAZOLAM 0.25 MG PO TABS: 0.2500 mg | ORAL_TABLET | Freq: Two times a day (BID) | ORAL | 2 refills | Status: DC | PRN

## 2020-07-18 MED FILL — ALPRAZolam 0.25 MG TABS: 0.25 | 15 days supply | Qty: 30 | Fill #0

## 2020-07-18 NOTE — Progress Notes (Signed)
pmp reviewed  Med refilled

## 2020-08-19 MED FILL — DULoxetine HCL 30 MG CPEP: 30 | 90 days supply | Qty: 180 | Fill #3

## 2020-08-23 DIAGNOSIS — H2513 Age-related nuclear cataract, bilateral: Secondary | ICD-10-CM | POA: Diagnosis not present

## 2020-08-23 DIAGNOSIS — D3132 Benign neoplasm of left choroid: Secondary | ICD-10-CM | POA: Diagnosis not present

## 2020-08-23 DIAGNOSIS — M328 Other forms of systemic lupus erythematosus: Secondary | ICD-10-CM | POA: Diagnosis not present

## 2020-08-23 DIAGNOSIS — H16223 Keratoconjunctivitis sicca, not specified as Sjogren's, bilateral: Secondary | ICD-10-CM | POA: Diagnosis not present

## 2020-08-29 MED FILL — ALPRAZolam 0.25 MG TABS: 0.25 | 15 days supply | Qty: 30 | Fill #1

## 2020-09-23 ENCOUNTER — Other Ambulatory Visit: Payer: Self-pay | Admitting: *Deleted

## 2020-09-23 DIAGNOSIS — M79605 Pain in left leg: Secondary | ICD-10-CM

## 2020-09-24 ENCOUNTER — Ambulatory Visit (INDEPENDENT_AMBULATORY_CARE_PROVIDER_SITE_OTHER): Payer: 59 | Admitting: Obstetrics & Gynecology

## 2020-09-24 ENCOUNTER — Other Ambulatory Visit: Payer: Self-pay | Admitting: Obstetrics & Gynecology

## 2020-09-24 ENCOUNTER — Other Ambulatory Visit: Payer: Self-pay

## 2020-09-24 ENCOUNTER — Encounter: Payer: Self-pay | Admitting: Obstetrics & Gynecology

## 2020-09-24 VITALS — BP 134/86 | Ht 65.0 in | Wt 189.0 lb

## 2020-09-24 DIAGNOSIS — R1032 Left lower quadrant pain: Secondary | ICD-10-CM | POA: Diagnosis not present

## 2020-09-24 DIAGNOSIS — F411 Generalized anxiety disorder: Secondary | ICD-10-CM | POA: Diagnosis not present

## 2020-09-24 DIAGNOSIS — Z780 Asymptomatic menopausal state: Secondary | ICD-10-CM

## 2020-09-24 DIAGNOSIS — Z1231 Encounter for screening mammogram for malignant neoplasm of breast: Secondary | ICD-10-CM

## 2020-09-24 DIAGNOSIS — Z13820 Encounter for screening for osteoporosis: Secondary | ICD-10-CM

## 2020-09-24 DIAGNOSIS — Z01419 Encounter for gynecological examination (general) (routine) without abnormal findings: Secondary | ICD-10-CM

## 2020-09-24 MED ORDER — ALPRAZOLAM 0.25 MG PO TABS: 0.2500 mg | ORAL_TABLET | Freq: Two times a day (BID) | ORAL | 1 refills | Status: DC | PRN

## 2020-09-24 MED FILL — ALPRAZolam 0.25 MG TABS: 0.25 | 15 days supply | Qty: 30 | Fill #0

## 2020-09-24 NOTE — Progress Notes (Signed)
Patricia Mathews 27-Jan-1961 [id number]   History:    59 y.o. G2P2 Married.  Husband is a Engineer, drilling.  From [place].  Speaks both [place] and [place].  RP:  Established patient presenting for annual gyn exam   HPI:  Postmenopause x about 8 years.  No HRT.  No PMB.  Intermittent left lower quadrant pain.  Followed for Sjogren syndrome and Lupus.  On cymbalta and Lisinopril.  Breasts wnl. Needs to schedule a mammogram.  Mictions/BMs wnl.  BMI 31.45.  Changed to a healthier diet x 2 months.  Lower sugar/cholest/salt.  Eating a lot of almonds.  Health labs with Fam MD.  Harriet Masson 2013.   Past medical history,surgical history, family history and social history were all reviewed and documented in the EPIC chart.  Gynecologic History No LMP recorded. Patient is postmenopausal.  Obstetric History OB History  Gravida Para Term Preterm AB Living  2 2       2   SAB TAB Ectopic Multiple Live Births               # Outcome Date GA Lbr Len/2nd Weight Sex Delivery Anes PTL Lv  2 Para           1 Para              ROS: A ROS was performed and pertinent positives and negatives are included in the history.  GENERAL: No fevers or chills. HEENT: No change in vision, no earache, sore throat or sinus congestion. NECK: No pain or stiffness. CARDIOVASCULAR: No chest pain or pressure. No palpitations. PULMONARY: No shortness of breath, cough or wheeze. GASTROINTESTINAL: No abdominal pain, nausea, vomiting or diarrhea, melena or bright red blood per rectum. GENITOURINARY: No urinary frequency, urgency, hesitancy or dysuria. MUSCULOSKELETAL: No joint or muscle pain, no back pain, no recent trauma. DERMATOLOGIC: No rash, no itching, no lesions. ENDOCRINE: No polyuria, polydipsia, no heat or cold intolerance. No recent change in weight. HEMATOLOGICAL: No anemia or easy bruising or bleeding. NEUROLOGIC: No headache, seizures, numbness, tingling or weakness. PSYCHIATRIC: No depression, no loss of interest in  normal activity or change in sleep pattern.     Exam:   BP 134/86   Ht 5\' 5"  (1.651 m)   Wt 189 lb (85.7 kg)   BMI 31.45 kg/m   Body mass index is 31.45 kg/m.  General appearance : Well developed well nourished female. No acute distress HEENT: Eyes: no retinal hemorrhage or exudates,  Neck supple, trachea midline, no carotid bruits, no thyroidmegaly Lungs: Clear to auscultation, no rhonchi or wheezes, or rib retractions  Heart: Regular rate and rhythm, no murmurs or gallops Breast:Examined in sitting and supine position were symmetrical in appearance, no palpable masses or tenderness,  no skin retraction, no nipple inversion, no nipple discharge, no skin discoloration, no axillary or supraclavicular lymphadenopathy Abdomen: no palpable masses or tenderness, no rebound or guarding Extremities: no edema or skin discoloration or tenderness  Pelvic: Vulva: Normal             Vagina: No gross lesions or discharge  Cervix: No gross lesions or discharge.  Pap reflex done.  Uterus  AV, normal size, shape and consistency, non-tender and mobile  Adnexa  Without masses or tenderness  Anus: Normal   Assessment/Plan:  59 y.o. female for annual exam   1. Encounter for routine gynecological examination with Papanicolaou smear of cervix Normal gynecologic exam in menopause.  Pap reflex done.  Breast exam normal.  Schedule  screening mammo now.  [place] 2013.  Health labs with Fam MD.  BMI 31.45.  Recommend a lower calorie/carb diet.  Aerobic activities 5 times a week, light weight lifting every 2 days.  2. Postmenopause Well on no HRT.  No PMB.  3. Screening for osteoporosis Schedule BD here now.  Vit D supplements, Ca++ 1500 mg daily and regular weight bearing physical activities.   - DG Bone Density; Future  4. Left lower quadrant pain Lower left abdominal discomfort intermittently x 2 weeks. Gynecologic exam normal, but will complete the evaluation with a pelvic US at f/u.  Probably of  intestinal origin with changes in nutrition.  Recommend decreasing the consumption of almonds.  Diverticulosis?  Recommend evaluation by Gertie Fey. - US Transvaginal Non-OB; Future  5. GAD (generalized anxiety disorder) Recommend physical activity to release endorphins.  Xanax 0.25 mg 1 tab BID PRN prescribed.  Other orders - DULoxetine (CYMBALTA) 20 MG capsule; Take 20 mg by mouth daily. - ALPRAZolam (XANAX) 0.25 MG tablet; Take 1 tablet (0.25 mg total) by mouth 2 (two) times daily as needed for anxiety.  Princess Bruins MD, 11:27 AM 09/24/2020

## 2020-09-25 ENCOUNTER — Ambulatory Visit (HOSPITAL_COMMUNITY)
Admission: RE | Admit: 2020-09-25 | Discharge: 2020-09-25 | Disposition: A | Discharge status: Home / Self Care | Payer: 59 | Source: Ambulatory Visit | Attending: Emergency Medicine | Admitting: Emergency Medicine

## 2020-09-25 ENCOUNTER — Telehealth: Payer: Self-pay | Admitting: Emergency Medicine

## 2020-09-25 DIAGNOSIS — M79605 Pain in left leg: Secondary | ICD-10-CM | POA: Diagnosis not present

## 2020-09-25 LAB — PAP IG W/ RFLX HPV ASCU

## 2020-09-25 NOTE — Progress Notes (Signed)
Left lower extremity venous duplex completed. Refer to "CV Proc" under chart review to view preliminary results.  09/25/2020 11:26 AM Kelby Aline., MHA, RVT, RDCS, RDMS

## 2020-09-25 NOTE — Telephone Encounter (Signed)
Vascular lab at [facility] office called with the results from pts visit and pt was neg for DVT in left leg. Please advise.

## 2020-09-26 NOTE — Telephone Encounter (Signed)
Left voicemail for pt

## 2020-09-27 ENCOUNTER — Ambulatory Visit: Payer: 59 | Admitting: Obstetrics & Gynecology

## 2020-09-28 ENCOUNTER — Encounter: Payer: Self-pay | Admitting: Obstetrics & Gynecology

## 2020-09-30 ENCOUNTER — Other Ambulatory Visit: Payer: Self-pay | Admitting: *Deleted

## 2020-09-30 DIAGNOSIS — M79652 Pain in left thigh: Secondary | ICD-10-CM

## 2020-10-11 ENCOUNTER — Other Ambulatory Visit: Payer: Self-pay | Admitting: Emergency Medicine

## 2020-10-11 ENCOUNTER — Ambulatory Visit (HOSPITAL_COMMUNITY): Admission: RE | Admit: 2020-10-11 | Payer: 59 | Source: Ambulatory Visit

## 2020-10-11 ENCOUNTER — Other Ambulatory Visit: Payer: Self-pay

## 2020-10-11 ENCOUNTER — Ambulatory Visit (HOSPITAL_COMMUNITY)
Admission: RE | Admit: 2020-10-11 | Discharge: 2020-10-11 | Disposition: A | Discharge status: Home / Self Care | Payer: 59 | Source: Ambulatory Visit | Attending: Emergency Medicine | Admitting: Emergency Medicine

## 2020-10-11 DIAGNOSIS — E785 Hyperlipidemia, unspecified: Secondary | ICD-10-CM | POA: Diagnosis not present

## 2020-10-11 DIAGNOSIS — M79652 Pain in left thigh: Secondary | ICD-10-CM | POA: Insufficient documentation

## 2020-10-11 DIAGNOSIS — G4733 Obstructive sleep apnea (adult) (pediatric): Secondary | ICD-10-CM | POA: Diagnosis not present

## 2020-10-11 DIAGNOSIS — M329 Systemic lupus erythematosus, unspecified: Secondary | ICD-10-CM | POA: Diagnosis not present

## 2020-10-11 DIAGNOSIS — M1612 Unilateral primary osteoarthritis, left hip: Secondary | ICD-10-CM | POA: Diagnosis not present

## 2020-10-11 DIAGNOSIS — I10 Essential (primary) hypertension: Secondary | ICD-10-CM | POA: Diagnosis not present

## 2020-10-12 LAB — ANA W/REFLEX IF POSITIVE
Anti JO-1: <0.2 AI (ref 0.0–0.9)
Anti Nuclear Antibody (ANA): POSITIVE — AB
Centromere Ab Screen: <0.2 AI (ref 0.0–0.9)
Chromatin Ab SerPl-aCnc: <0.2 AI (ref 0.0–0.9)
ENA RNP Ab: <0.2 AI (ref 0.0–0.9)
ENA SM Ab Ser-aCnc: <0.2 AI (ref 0.0–0.9)
ENA SSA (RO) Ab: 4.7 AI — ABNORMAL HIGH (ref 0.0–0.9)
ENA SSB (LA) Ab: <0.2 AI (ref 0.0–0.9)
Scleroderma (Scl-70) (ENA) Antibody, IgG: <0.2 AI (ref 0.0–0.9)
dsDNA Ab: <1 IU/mL (ref 0–9)

## 2020-10-12 LAB — COMPREHENSIVE METABOLIC PANEL
ALT: 20 IU/L (ref 0–32)
AST: 13 IU/L (ref 0–40)
Albumin/Globulin Ratio: 1.5 (ref 1.2–2.2)
Albumin: 4.3 g/dL (ref 3.8–4.9)
Alkaline Phosphatase: 98 IU/L (ref 44–121)
BUN/Creatinine Ratio: 16 (ref 12–28)
BUN: 12 mg/dL (ref 8–27)
Bilirubin Total: 0.4 mg/dL (ref 0.0–1.2)
CO2: 22 mmol/L (ref 20–29)
Calcium: 9.5 mg/dL (ref 8.7–10.3)
Chloride: 104 mmol/L (ref 96–106)
Creatinine, Ser: 0.73 mg/dL (ref 0.57–1.00)
GFR calc Af Amer: 104 mL/min/{1.73_m2} (ref >59)
GFR calc non Af Amer: 90 mL/min/{1.73_m2} (ref >59)
Globulin, Total: 2.8 g/dL (ref 1.5–4.5)
Glucose: 112 mg/dL — ABNORMAL HIGH (ref 65–99)
Potassium: 4.3 mmol/L (ref 3.5–5.2)
Sodium: 140 mmol/L (ref 134–144)
Total Protein: 7.1 g/dL (ref 6.0–8.5)

## 2020-10-12 LAB — CBC WITH DIFFERENTIAL/PLATELET
Basophils Absolute: 0 10*3/uL (ref 0.0–0.2)
Basos: 1 %
EOS (ABSOLUTE): 0.1 10*3/uL (ref 0.0–0.4)
Eos: 3 %
Hematocrit: 39.2 % (ref 34.0–46.6)
Hemoglobin: 13.3 g/dL (ref 11.1–15.9)
Immature Grans (Abs): 0 10*3/uL (ref 0.0–0.1)
Immature Granulocytes: 0 %
Lymphocytes Absolute: 1.6 10*3/uL (ref 0.7–3.1)
Lymphs: 28 %
MCH: 31.4 pg (ref 26.6–33.0)
MCHC: 33.9 g/dL (ref 31.5–35.7)
MCV: 93 fL (ref 79–97)
Monocytes Absolute: 0.5 10*3/uL (ref 0.1–0.9)
Monocytes: 8 %
Neutrophils Absolute: 3.4 10*3/uL (ref 1.4–7.0)
Neutrophils: 60 %
Platelets: 318 10*3/uL (ref 150–450)
RBC: 4.23 x10E6/uL (ref 3.77–5.28)
RDW: 13.4 % (ref 11.7–15.4)
WBC: 5.6 10*3/uL (ref 3.4–10.8)

## 2020-10-12 LAB — LIPID PANEL W/O CHOL/HDL RATIO
Cholesterol, Total: 235 mg/dL — ABNORMAL HIGH (ref 100–199)
HDL: 79 mg/dL (ref >39)
LDL Chol Calc (NIH): 139 mg/dL — ABNORMAL HIGH (ref 0–99)
Triglycerides: 97 mg/dL (ref 0–149)
VLDL Cholesterol Cal: 17 mg/dL (ref 5–40)

## 2020-10-12 LAB — SAR COV2 SEROLOGY (COVID19)AB(IGG),IA
SARS-CoV-2 Semi-Quant IgG Ab: >800 AU/mL (ref <13.0)
SARS-CoV-2 Spike Ab Interp: POSITIVE

## 2020-10-12 LAB — HGB A1C W/O EAG: Hgb A1c MFr Bld: 5.6 % (ref 4.8–5.6)

## 2020-10-12 LAB — TSH: TSH: 1.15 u[IU]/mL (ref 0.450–4.500)

## 2020-10-12 LAB — VITAMIN B12: Vitamin B-12: 537 pg/mL (ref 232–1245)

## 2020-10-12 LAB — SPECIMEN STATUS REPORT

## 2020-10-14 ENCOUNTER — Other Ambulatory Visit: Payer: Self-pay | Admitting: *Deleted

## 2020-10-14 ENCOUNTER — Other Ambulatory Visit (HOSPITAL_COMMUNITY): Payer: 59

## 2020-10-14 MED ORDER — ROSUVASTATIN CALCIUM 10 MG PO TABS: 10.0000 mg | ORAL_TABLET | Freq: Every day | ORAL | 3 refills | Status: DC

## 2020-10-14 MED FILL — ROSUVASTATIN CALCIUM 10 MG: 10 | 90 days supply | Qty: 90 | Fill #0

## 2020-10-16 ENCOUNTER — Other Ambulatory Visit: Payer: Self-pay

## 2020-10-16 ENCOUNTER — Other Ambulatory Visit: Payer: Self-pay | Admitting: Obstetrics & Gynecology

## 2020-10-16 ENCOUNTER — Ambulatory Visit (INDEPENDENT_AMBULATORY_CARE_PROVIDER_SITE_OTHER): Payer: 59

## 2020-10-16 DIAGNOSIS — Z13820 Encounter for screening for osteoporosis: Secondary | ICD-10-CM | POA: Diagnosis not present

## 2020-10-16 DIAGNOSIS — Z780 Asymptomatic menopausal state: Secondary | ICD-10-CM

## 2020-10-17 ENCOUNTER — Ambulatory Visit (INDEPENDENT_AMBULATORY_CARE_PROVIDER_SITE_OTHER): Payer: 59

## 2020-10-17 ENCOUNTER — Telehealth: Payer: Self-pay

## 2020-10-17 ENCOUNTER — Ambulatory Visit: Payer: 59 | Admitting: Obstetrics & Gynecology

## 2020-10-17 DIAGNOSIS — N854 Malposition of uterus: Secondary | ICD-10-CM | POA: Diagnosis not present

## 2020-10-17 DIAGNOSIS — R1032 Left lower quadrant pain: Secondary | ICD-10-CM

## 2020-10-17 DIAGNOSIS — R9389 Abnormal findings on diagnostic imaging of other specified body structures: Secondary | ICD-10-CM

## 2020-10-17 DIAGNOSIS — N840 Polyp of corpus uteri: Secondary | ICD-10-CM

## 2020-10-17 DIAGNOSIS — D252 Subserosal leiomyoma of uterus: Secondary | ICD-10-CM

## 2020-10-17 NOTE — Progress Notes (Signed)
    Patricia Mathews [birth year] [id number]        59 y.o.  G2P2L2  RP: Intermittent LLQ pain for Pelvic US  HPI: Last visit on 09/24/2020 we noted: Postmenopause x about 8 years. No HRT. No PMB.  Intermittent left lower quadrant pain. Followed for Sjogren syndrome and Lupus. On cymbalta and Lisinopril. Breasts wnl. Needs to schedule a mammogram. Mictions/BMs wnl.  BMI 31.45.  Changed to a healthier diet x 2 months.  Lower sugar/cholest/salt.  Eating a lot of almonds.   OB History  Gravida Para Term Preterm AB Living  2 2       2   SAB IAB Ectopic Multiple Live Births               # Outcome Date GA Lbr Len/2nd Weight Sex Delivery Anes PTL Lv  2 Para           1 Para             Past medical history,surgical history, problem list, medications, allergies, family history and social history were all reviewed and documented in the EPIC chart.   Directed ROS with pertinent positives and negatives documented in the history of present illness/assessment and plan.  Exam:  There were no vitals filed for this visit. General appearance:  Normal  Pelvic US today: T/V images.  Anteverted uterus normal in size and shape with a right posterior subserosal fibroid measured at 5.9 x 5.6 cm.  The overall uterine size excluding the fibroid is measured at 6.63 x 3.68 x 2.84 cm.  The endometrial lining is thickened measured at 10.12 mm with a probable endometrial polyp with cystic spaces and a feeder vessel.  Both ovaries are normal in size.  Left ovary with a residual simple avascular cystic structure measured at 8 mm.  No adnexal mass.  No free fluid in the posterior cul-de-sac.   Assessment/Plan:  59 y.o. G2P2L2  1. Left lower quadrant pain Pelvic US findings thoroughly reviewed with patient.  Patient reassured about the subserosal uterine fibroid in menopause and normal bilateral ovaries with no adnexal mass or free fluid in the pelvis..  LLQ pain probably intestinal or referred from the  lower back.  2. Endometrial polyp Thickened endometrial lining with a probable endometrial polyp with a feeder vessel.  Management of endometrial polyps and significance reviewed with patient.  Decision to schedule a [facility] Myosure excision of Endometrial Polyp/D+C.  Information about surgery reviewed and hysteroscopy pamphlet given.  Patient will follow up for preop visit.  Princess Bruins MD, 10:41 AM 10/17/2020

## 2020-10-17 NOTE — Telephone Encounter (Signed)
Left message to call me.

## 2020-10-18 ENCOUNTER — Encounter: Payer: Self-pay | Admitting: Obstetrics & Gynecology

## 2020-10-18 NOTE — Telephone Encounter (Signed)
Patient called back. We reviewed her ins benefits and her estimated GGA surgery prepayment due by one week before surgery.   I scheduled her surgery for 11/05/20 10:00am at [facility]. I discussed with her need for Covid test prior and it was scheduled according to holiday instructions provided. I reviewed quarantine protocol with her for after the test. I will mail her a packet.

## 2020-10-21 ENCOUNTER — Other Ambulatory Visit: Payer: Self-pay | Admitting: *Deleted

## 2020-10-21 DIAGNOSIS — G5712 Meralgia paresthetica, left lower limb: Secondary | ICD-10-CM

## 2020-10-25 ENCOUNTER — Ambulatory Visit: Payer: 59

## 2020-10-28 ENCOUNTER — Ambulatory Visit: Payer: 59 | Admitting: Obstetrics & Gynecology

## 2020-10-28 ENCOUNTER — Encounter: Payer: Self-pay | Admitting: Obstetrics & Gynecology

## 2020-10-28 ENCOUNTER — Other Ambulatory Visit: Payer: Self-pay

## 2020-10-28 VITALS — BP 140/88

## 2020-10-28 DIAGNOSIS — R1032 Left lower quadrant pain: Secondary | ICD-10-CM

## 2020-10-28 DIAGNOSIS — R9389 Abnormal findings on diagnostic imaging of other specified body structures: Secondary | ICD-10-CM | POA: Diagnosis not present

## 2020-10-28 DIAGNOSIS — N840 Polyp of corpus uteri: Secondary | ICD-10-CM

## 2020-10-28 NOTE — Progress Notes (Signed)
    Patricia Mathews 10-11-1961 [id number]        59 y.o.  G2P2L2   RP: Preop [place] Myosure excision/D+C  HPI: Pelvic US 10/17/2020 showed a thickened endometrial line measured at 10.12 mm with a probable endometrial polyp with cystic spaces and a feeder vessel.  Postmenopause x about8years. No HRT. No PMB.Intermittent left lower quadrant pain. Followed for Sjogren syndrome and Lupus. On cymbalta and Lisinopril. Breasts wnl.Needs to schedule a mammogram.Mictions/BMs wnl.BMI 31.45. Changed to a healthier diet x 3 months. Lower sugar/cholest/salt. Eating a lot of almonds.   OB History  Gravida Para Term Preterm AB Living  2 2       2   SAB IAB Ectopic Multiple Live Births               # Outcome Date GA Lbr Len/2nd Weight Sex Delivery Anes PTL Lv  2 Para           1 Para             Past medical history,surgical history, problem list, medications, allergies, family history and social history were all reviewed and documented in the EPIC chart.   Directed ROS with pertinent positives and negatives documented in the history of present illness/assessment and plan.  Exam:  Vitals:   10/28/20 0947  BP: 140/88   General appearance:  Normal  Pelvic US 10/17/2020:  T/V images.  Anteverted uterus normal in size and shape with a right posterior subserosal fibroid measured at 5.9 x 5.6 cm.  The overall uterine size excluding the fibroid is measured at 6.63 x 3.68 x 2.84 cm.  The endometrial lining is thickened measured at 10.12 mm with a probable endometrial polyp with cystic spaces and a feeder vessel.  Both ovaries are normal in size.  Left ovary with a residual simple avascular cystic structure measured at 8 mm.  No adnexal mass.  No free fluid in the posterior cul-de-sac.  Hb 13.3 on 10/11/2020   Assessment/Plan:  59 y.o. G2P2   1. Endometrial polyp Thickened endometrium at 10.12 mm with a probable endometrial polyp with cystic spaces and a feeder vessel.  Decision to  proceed with a hysteroscopy, MyoSure excision and D&C.  Preop preparation, surgical procedure and risks as well as postop precautions and expectations thoroughly reviewed with patient.  2. Left lower quadrant pain Probably GI origin.  Will modify diet.                        Patient was counseled as to the risk of surgery to include the following:  1. Infection (prohylactic antibiotics will be administered)  2. DVT/Pulmonary Embolism (prophylactic pneumo compression stockings will be used)  3.Trauma to internal organs requiring additional surgical procedure to repair any injury to internal organs requiring perhaps additional hospitalization days.  4.Hemmorhage requiring transfusion and blood products which carry risks such as anaphylactic reaction, hepatitis and AIDS  Patient had received literature information on the procedure scheduled and all her questions were answered and fully accepts all risk.  Princess Bruins MD, 9:51 AM 10/28/2020

## 2020-10-29 ENCOUNTER — Telehealth: Payer: Self-pay

## 2020-10-29 NOTE — Telephone Encounter (Signed)
I noticed you ordered a UPT for her pre op order. She is 59 yo.  Ok to cancel with preadmitting?

## 2020-10-30 ENCOUNTER — Telehealth: Payer: Self-pay

## 2020-10-30 NOTE — Telephone Encounter (Signed)
Informed Denise at [facility].

## 2020-10-30 NOTE — Telephone Encounter (Signed)
Patricia Mathews at [facility] informed.

## 2020-10-30 NOTE — Telephone Encounter (Signed)
Fine with no CBC and no need for Hb/Hct.

## 2020-10-30 NOTE — Telephone Encounter (Signed)
Yes, cancel UPT.

## 2020-10-30 NOTE — Telephone Encounter (Signed)
You had ordered a pre op CBC but patient is unhappy about having to have labwork when she just had labs here but > 2 weeks ago.  Langley Gauss, RN said if you are okay with it they can not do the CBC and do an ISTAT (hgb.and hct) the morning of surgery.  OK?

## 2020-10-31 ENCOUNTER — Encounter (HOSPITAL_BASED_OUTPATIENT_CLINIC_OR_DEPARTMENT_OTHER): Payer: Self-pay | Admitting: Obstetrics & Gynecology

## 2020-11-04 ENCOUNTER — Encounter (HOSPITAL_BASED_OUTPATIENT_CLINIC_OR_DEPARTMENT_OTHER): Payer: Self-pay | Admitting: Obstetrics & Gynecology

## 2020-11-04 ENCOUNTER — Other Ambulatory Visit (HOSPITAL_COMMUNITY)
Admission: RE | Admit: 2020-11-04 | Discharge: 2020-11-04 | Disposition: A | Discharge status: Home / Self Care | Payer: 59 | Source: Ambulatory Visit | Attending: Obstetrics & Gynecology | Admitting: Obstetrics & Gynecology

## 2020-11-04 ENCOUNTER — Other Ambulatory Visit: Payer: Self-pay

## 2020-11-04 DIAGNOSIS — Z01818 Encounter for other preprocedural examination: Secondary | ICD-10-CM | POA: Diagnosis not present

## 2020-11-04 DIAGNOSIS — Z20822 Contact with and (suspected) exposure to covid-19: Secondary | ICD-10-CM | POA: Insufficient documentation

## 2020-11-04 LAB — SARS CORONAVIRUS 2 (TAT 6-24 HRS): SARS Coronavirus 2: NEGATIVE

## 2020-11-04 NOTE — Progress Notes (Signed)
Spoke w/ via phone for pre-op interview--- PT Lab needs dos----Istat and EKG (you see previous progress note from [facility] and Dr Dellis Filbert, stated pt did need cbc or urine preg. And [facility] is ok. Unable to d/c since [facility] update).               Lab results------ no COVID test ------ done 11-04-2020 result in epic Arrive at ------- 0800 NPO after MN NO Solid Food.  Clear liquids from MN until--- 0700 Medications to take morning of surgery ----- Cymbalta, Crestor Diabetic medication ----- n/a Patient Special Instructions ----- n/a Pre-Op special Istructions ----- n/a Patient verbalized understanding of instructions that were given at this phone interview. Patient denies shortness of breath, chest pain, fever, cough at this phone interview.   Anesthesia :  HTN;  Systemic Lupus (currently no treatment);  Sjogren's;  OSA uses cpap nightly per pt.  PCP:  Dr Jerilynn Mages. Mitchel Honour Cardiologist : no Rheumatology:  Dr Page Spiro Chest x-ray :  no EKG : no Echo : no Stress test: no Cardiac Cath :  no Activity level:  Denies sob w/ any activity Sleep Study/ CPAP : YES/ YES Fasting Blood Sugar :      / Checks Blood Sugar -- times a day:   N/A Blood Thinner/ Instructions /Last Dose: NO ASA / Instructions/ Last Dose :  NO

## 2020-11-05 ENCOUNTER — Ambulatory Visit: Payer: 59

## 2020-11-05 ENCOUNTER — Other Ambulatory Visit: Payer: Self-pay

## 2020-11-05 ENCOUNTER — Ambulatory Visit (HOSPITAL_BASED_OUTPATIENT_CLINIC_OR_DEPARTMENT_OTHER)
Admission: RE | Admit: 2020-11-05 | Discharge: 2020-11-05 | Disposition: A | Discharge status: Home / Self Care | Payer: 59 | Attending: Obstetrics & Gynecology | Admitting: Obstetrics & Gynecology

## 2020-11-05 ENCOUNTER — Encounter (HOSPITAL_BASED_OUTPATIENT_CLINIC_OR_DEPARTMENT_OTHER): Payer: Self-pay | Admitting: Obstetrics & Gynecology

## 2020-11-05 ENCOUNTER — Encounter (HOSPITAL_BASED_OUTPATIENT_CLINIC_OR_DEPARTMENT_OTHER)
Admission: RE | Disposition: A | Discharge status: Home / Self Care | Payer: Self-pay | Source: Home / Self Care | Attending: Obstetrics & Gynecology

## 2020-11-05 ENCOUNTER — Ambulatory Visit (HOSPITAL_BASED_OUTPATIENT_CLINIC_OR_DEPARTMENT_OTHER): Payer: 59 | Admitting: Anesthesiology

## 2020-11-05 DIAGNOSIS — M3500 Sicca syndrome, unspecified: Secondary | ICD-10-CM | POA: Diagnosis not present

## 2020-11-05 DIAGNOSIS — I10 Essential (primary) hypertension: Secondary | ICD-10-CM | POA: Diagnosis not present

## 2020-11-05 DIAGNOSIS — Z8042 Family history of malignant neoplasm of prostate: Secondary | ICD-10-CM | POA: Insufficient documentation

## 2020-11-05 DIAGNOSIS — Z79899 Other long term (current) drug therapy: Secondary | ICD-10-CM | POA: Diagnosis not present

## 2020-11-05 DIAGNOSIS — M329 Systemic lupus erythematosus, unspecified: Secondary | ICD-10-CM | POA: Insufficient documentation

## 2020-11-05 DIAGNOSIS — Z87891 Personal history of nicotine dependence: Secondary | ICD-10-CM | POA: Diagnosis not present

## 2020-11-05 DIAGNOSIS — G4733 Obstructive sleep apnea (adult) (pediatric): Secondary | ICD-10-CM | POA: Diagnosis not present

## 2020-11-05 DIAGNOSIS — F411 Generalized anxiety disorder: Secondary | ICD-10-CM | POA: Diagnosis not present

## 2020-11-05 DIAGNOSIS — N840 Polyp of corpus uteri: Secondary | ICD-10-CM | POA: Insufficient documentation

## 2020-11-05 HISTORY — PX: DILATATION & CURETTAGE/HYSTEROSCOPY WITH MYOSURE: SHX6511

## 2020-11-05 HISTORY — DX: Obstructive sleep apnea (adult) (pediatric): G47.33

## 2020-11-05 HISTORY — DX: Polyp of corpus uteri: N84.0

## 2020-11-05 HISTORY — DX: Systemic lupus erythematosus, unspecified: M32.9

## 2020-11-05 HISTORY — DX: Personal history of other mental and behavioral disorders: Z86.59

## 2020-11-05 HISTORY — DX: Generalized anxiety disorder: F41.1

## 2020-11-05 HISTORY — DX: Polyneuropathy, unspecified: G62.9

## 2020-11-05 HISTORY — DX: Dependence on other enabling machines and devices: Z99.89

## 2020-11-05 HISTORY — DX: Fibromyalgia: M79.7

## 2020-11-05 LAB — POCT I-STAT, CHEM 8
BUN: 15 mg/dL (ref 6–20)
Calcium, Ion: 1.26 mmol/L (ref 1.15–1.40)
Chloride: 101 mmol/L (ref 98–111)
Creatinine, Ser: 0.7 mg/dL (ref 0.44–1.00)
Glucose, Bld: 104 mg/dL — ABNORMAL HIGH (ref 70–99)
HCT: 41 % (ref 36.0–46.0)
Hemoglobin: 13.9 g/dL (ref 12.0–15.0)
Potassium: 3.8 mmol/L (ref 3.5–5.1)
Sodium: 141 mmol/L (ref 135–145)
TCO2: 27 mmol/L (ref 22–32)

## 2020-11-05 SURGERY — DILATATION & CURETTAGE/HYSTEROSCOPY WITH MYOSURE
Anesthesia: General | Site: Vagina

## 2020-11-05 MED ORDER — DEXAMETHASONE SODIUM PHOSPHATE 10 MG/ML IJ SOLN
INTRAMUSCULAR | Status: DC | PRN
  Administered 2020-11-05 (×2): 5 mg via INTRAVENOUS

## 2020-11-05 MED ORDER — FENTANYL CITRATE (PF) 100 MCG/2ML IJ SOLN: 25.0000 ug | INTRAMUSCULAR | Status: DC | PRN

## 2020-11-05 MED ORDER — CLINDAMYCIN PHOSPHATE 900 MG/50ML IV SOLN
INTRAVENOUS | Status: AC
  Filled 2020-11-05: qty 50

## 2020-11-05 MED ORDER — ACETAMINOPHEN 500 MG PO TABS
ORAL_TABLET | ORAL | Status: AC
  Filled 2020-11-05: qty 2

## 2020-11-05 MED ORDER — FENTANYL CITRATE (PF) 100 MCG/2ML IJ SOLN
INTRAMUSCULAR | Status: AC
  Filled 2020-11-05: qty 2

## 2020-11-05 MED ORDER — PROPOFOL 10 MG/ML IV BOLUS
INTRAVENOUS | Status: DC | PRN
  Administered 2020-11-05: 150 mg via INTRAVENOUS

## 2020-11-05 MED ORDER — SODIUM CHLORIDE 0.9 % IR SOLN
Status: DC | PRN
  Administered 2020-11-05: 3000 mL

## 2020-11-05 MED ORDER — LIDOCAINE HCL (PF) 2 % IJ SOLN
INTRAMUSCULAR | Status: AC
  Filled 2020-11-05: qty 5

## 2020-11-05 MED ORDER — LACTATED RINGERS IV SOLN: INTRAVENOUS | Status: DC

## 2020-11-05 MED ORDER — CLINDAMYCIN PHOSPHATE 900 MG/50ML IV SOLN
900.0000 mg | INTRAVENOUS | Status: AC
  Administered 2020-11-05: 900 mg via INTRAVENOUS

## 2020-11-05 MED ORDER — ONDANSETRON HCL 4 MG/2ML IJ SOLN
INTRAMUSCULAR | Status: DC | PRN
  Administered 2020-11-05: 4 mg via INTRAVENOUS

## 2020-11-05 MED ORDER — FENTANYL CITRATE (PF) 100 MCG/2ML IJ SOLN
INTRAMUSCULAR | Status: DC | PRN
  Administered 2020-11-05 (×2): 50 ug via INTRAVENOUS

## 2020-11-05 MED ORDER — POVIDONE-IODINE 10 % EX SWAB: 2.0000 "application " | Freq: Once | CUTANEOUS | Status: DC

## 2020-11-05 MED ORDER — LIDOCAINE HCL 1 % IJ SOLN
INTRAMUSCULAR | Status: DC | PRN
  Administered 2020-11-05: 20 mL

## 2020-11-05 MED ORDER — LIDOCAINE 2% (20 MG/ML) 5 ML SYRINGE
INTRAMUSCULAR | Status: DC | PRN
  Administered 2020-11-05: 100 mg via INTRAVENOUS

## 2020-11-05 MED ORDER — MIDAZOLAM HCL 2 MG/2ML IJ SOLN
INTRAMUSCULAR | Status: AC
  Filled 2020-11-05: qty 2

## 2020-11-05 MED ORDER — ACETAMINOPHEN 500 MG PO TABS
1000.0000 mg | ORAL_TABLET | Freq: Once | ORAL | Status: AC
  Administered 2020-11-05: 1000 mg via ORAL

## 2020-11-05 MED ORDER — MIDAZOLAM HCL 2 MG/2ML IJ SOLN
INTRAMUSCULAR | Status: DC | PRN
  Administered 2020-11-05: 2 mg via INTRAVENOUS

## 2020-11-05 MED ORDER — GENTAMICIN SULFATE 40 MG/ML IJ SOLN
5.0000 mg/kg | INTRAVENOUS | Status: AC
  Administered 2020-11-05: 360 mg via INTRAVENOUS
  Filled 2020-11-05 (×2): qty 9

## 2020-11-05 MED ORDER — PROPOFOL 10 MG/ML IV BOLUS
INTRAVENOUS | Status: AC
  Filled 2020-11-05: qty 20

## 2020-11-05 SURGICAL SUPPLY — 22 items
CATH ROBINSON RED A/P 16FR (CATHETERS) ×2 IMPLANT
COVER WAND RF STERILE (DRAPES) ×2 IMPLANT
DEVICE MYOSURE LITE (MISCELLANEOUS) ×2 IMPLANT
DEVICE MYOSURE REACH (MISCELLANEOUS) IMPLANT
DILATOR CANAL MILEX (MISCELLANEOUS) IMPLANT
ELECT REM PT RETURN 9FT ADLT (ELECTROSURGICAL)
ELECTRODE REM PT RTRN 9FT ADLT (ELECTROSURGICAL) IMPLANT
GAUZE 4X4 16PLY RFD (DISPOSABLE) ×2 IMPLANT
GLOVE BIO SURGEON STRL SZ 6.5 (GLOVE) ×2 IMPLANT
GLOVE BIOGEL PI IND STRL 7.0 (GLOVE) ×2 IMPLANT
GLOVE BIOGEL PI INDICATOR 7.0 (GLOVE) ×2
GOWN STRL REUS W/TWL LRG LVL3 (GOWN DISPOSABLE) ×4 IMPLANT
IV NS IRRIG 3000ML ARTHROMATIC (IV SOLUTION) ×2 IMPLANT
KIT PROCEDURE FLUENT (KITS) ×2 IMPLANT
KIT TURNOVER CYSTO (KITS) ×2 IMPLANT
MYOSURE XL FIBROID (MISCELLANEOUS)
PACK VAGINAL MINOR WOMEN LF (CUSTOM PROCEDURE TRAY) ×2 IMPLANT
PAD OB MATERNITY 4.3X12.25 (PERSONAL CARE ITEMS) ×2 IMPLANT
PAD PREP 24X48 CUFFED NSTRL (MISCELLANEOUS) ×2 IMPLANT
SEAL CERVICAL OMNI LOK (ABLATOR) IMPLANT
SEAL ROD LENS SCOPE MYOSURE (ABLATOR) ×2 IMPLANT
SYSTEM TISS REMOVAL MYOSURE XL (MISCELLANEOUS) IMPLANT

## 2020-11-05 NOTE — H&P (Signed)
@LOGO @  Patricia Mathews is an 59 y.o. female. G2P2L2   RP: [place] Myosure excision/D+C  HPI: No change since last visit 12/20th.  Pelvic US 10/17/2020 showed a thickened endometrial line measured at 10.12 mm with a probable endometrial polyp with cystic spaces and a feeder vessel. Postmenopause x about8years. No HRT. No PMB.Intermittent left lower quadrant pain. Followed for Sjogren syndrome and Lupus. On cymbalta and Lisinopril. Breasts wnl.Needs to schedule a mammogram.Mictions/BMs wnl.BMI 31.45. Changed to a healthier diet x 3 months. Lower sugar/cholest/salt. Eating a lot of almonds.   Pertinent Gynecological History: Blood transfusions: none Sexually transmitted diseases: no past history Last mammogram: normal  Last pap: normal    Menstrual History: No LMP recorded. Patient is postmenopausal.    Past Medical History:  Diagnosis Date  . Endometrial polyp   . Fibromyalgia   . GAD (generalized anxiety disorder)   . History of panic attacks   . Hypertension    followed by pcp  . Neuropathy   . OSA on CPAP    followed by dr dohmeier--- per study 02-02-2018 moderate to severe osa  . Sjogren's disease ([place])   . Systemic lupus ([place])    rheumonotologist-- dr Page Spiro    Past Surgical History:  Procedure Laterality Date  . [place];  1989  . COLONOSCOPY WITH PROPOFOL  10/2012    Family History  Problem Relation Age of Onset  . Hypertension Mother   . Osteoarthritis Mother   . Cancer Father        PROSTATE  . Hypertension Father   . Hypertension Sister   . Lupus Sister   . Cancer Brother        PROSTATE  . Lupus Brother   . Hypertension Brother   . Sarcoidosis Paternal Aunt   . Hypertension Brother   . Hypertension Brother   . Breast cancer Neg Hx     Social History:  reports that she quit smoking about 33 years ago. Her smoking use included cigarettes. She quit after 10.00 years of use. She has never used smokeless tobacco. She  reports current alcohol use. She reports that she does not use drugs.  Allergies:  Allergies  Allergen Reactions  . Penicillins Anaphylaxis    Medications Prior to Admission  Medication Sig Dispense Refill Last Dose  . ALPRAZolam (XANAX) 0.25 MG tablet Take 1 tablet (0.25 mg total) by mouth 2 (two) times daily as needed for anxiety. 30 tablet 1   . cholecalciferol (VITAMIN D) 1000 units tablet Take 1,000 Units by mouth daily.     . DULoxetine (CYMBALTA) 30 MG capsule Take 30 mg by mouth daily.     Marland Kitchen losartan-hydrochlorothiazide (HYZAAR) 50-12.5 MG tablet Take 1 tablet by mouth daily. (Patient taking differently: Take 1 tablet by mouth daily.) 90 tablet 1   . rosuvastatin (CRESTOR) 10 MG tablet Take 1 tablet (10 mg total) by mouth daily. 90 tablet 3     REVIEW OF SYSTEMS: A ROS was performed and pertinent positives and negatives are included in the history.  GENERAL: No fevers or chills. HEENT: No change in vision, no earache, sore throat or sinus congestion. NECK: No pain or stiffness. CARDIOVASCULAR: No chest pain or pressure. No palpitations. PULMONARY: No shortness of breath, cough or wheeze. GASTROINTESTINAL: No abdominal pain, nausea, vomiting or diarrhea, melena or bright red blood per rectum. GENITOURINARY: No urinary frequency, urgency, hesitancy or dysuria. MUSCULOSKELETAL: No joint or muscle pain, no back pain, no recent trauma. DERMATOLOGIC: No  rash, no itching, no lesions. ENDOCRINE: No polyuria, polydipsia, no heat or cold intolerance. No recent change in weight. HEMATOLOGICAL: No anemia or easy bruising or bleeding. NEUROLOGIC: No headache, seizures, numbness, tingling or weakness. PSYCHIATRIC: No depression, no loss of interest in normal activity or change in sleep pattern.     Height 5\' 5"  (1.651 m), weight 84.8 kg.  Physical Exam:  See office notes   Results for orders placed or performed during the hospital encounter of 11/04/20 (from the past 24 hour(s))  SARS  CORONAVIRUS 2 (TAT 6-24 HRS) Nasopharyngeal Nasopharyngeal Swab     Status: None   Collection Time: 11/04/20 10:09 AM   Specimen: Nasopharyngeal Swab  Result Value Ref Range   SARS Coronavirus 2 NEGATIVE NEGATIVE   Pelvic US 10/17/2020: T/V images. Anteverted uterus normal in size and shape with a right posterior subserosal fibroid measured at 5.9 x 5.6 cm. The overall uterine size excluding the fibroid is measured at 6.63 x 3.68 x 2.84 cm. The endometrial lining is thickened measured at 10.12 mm with a probable endometrial polyp with cystic spaces and a feeder vessel. Both ovaries are normal in size. Left ovary with a residual simple avascular cystic structure measured at 8 mm. No adnexal mass. No free fluid in the posterior cul-de-sac.  Hb 13.3 on 10/11/2020   Assessment/Plan:  59 y.o. G2P2   1. Endometrial polyp Thickened endometrium at 10.12 mm with a probable endometrial polyp with cystic spaces and a feeder vessel.  Decision to proceed with a hysteroscopy, MyoSure excision and D&C.  Preop preparation, surgical procedure and risks as well as postop precautions and expectations thoroughly reviewed with patient.  2. Left lower quadrant pain Probably GI origin.  Will modify diet.                        Patient was counseled as to the risk of surgery to include the following:  1. Infection (prohylactic antibiotics will be administered)  2. DVT/Pulmonary Embolism (prophylactic pneumo compression stockings will be used)  3.Trauma to internal organs requiring additional surgical procedure to repair any injury to internal organs requiring perhaps additional hospitalization days.  4.Hemmorhage requiring transfusion and blood products which carry risks such as anaphylactic reaction, hepatitis and AIDS  Patient had received literature information on the procedure scheduled and all her questions were answered and fully accepts all risk.  Patricia Mathews 11/05/2020, 8:10  AM

## 2020-11-05 NOTE — Anesthesia Postprocedure Evaluation (Signed)
Anesthesia Post Note  Patient: Patricia Mathews  Procedure(s) Performed: DILATATION & CURETTAGE/HYSTEROSCOPY WITH MYOSURE (N/A Vagina )     Patient location during evaluation: PACU Anesthesia Type: General Level of consciousness: awake and alert Pain management: pain level controlled Vital Signs Assessment: post-procedure vital signs reviewed and stable Respiratory status: spontaneous breathing, nonlabored ventilation, respiratory function stable and patient connected to nasal cannula oxygen Cardiovascular status: blood pressure returned to baseline and stable Postop Assessment: no apparent nausea or vomiting Anesthetic complications: no   No complications documented.  Last Vitals:  Vitals:   11/05/20 1100 11/05/20 1134  BP: 131/76 138/80  Pulse: 69 72  Resp: 13   Temp:    SpO2: 92% 95%    Last Pain:  Vitals:   11/05/20 1134  TempSrc:   PainSc: 0-No pain                 Elbia Paro L Ama Mcmaster

## 2020-11-05 NOTE — Anesthesia Preprocedure Evaluation (Signed)
Anesthesia Evaluation  Patient identified by MRN, date of birth, ID band Patient awake    Reviewed: Allergy & Precautions, NPO status , Patient's Chart, lab work & pertinent test results  Airway Mallampati: II  TM Distance: >3 FB Neck ROM: Full    Dental no notable dental hx.    Pulmonary sleep apnea , former smoker,    Pulmonary exam normal breath sounds clear to auscultation       Cardiovascular hypertension, Pt. on medications negative cardio ROS Normal cardiovascular exam Rhythm:Regular Rate:Normal     Neuro/Psych  Headaches, PSYCHIATRIC DISORDERS Anxiety    GI/Hepatic negative GI ROS, Neg liver ROS,   Endo/Other  negative endocrine ROSLupus, sjogren's  Renal/GU negative Renal ROS  negative genitourinary   Musculoskeletal  (+) Fibromyalgia -  Abdominal   Peds  Hematology negative hematology ROS (+)   Anesthesia Other Findings   Reproductive/Obstetrics                             Anesthesia Physical Anesthesia Plan  ASA: III  Anesthesia Plan: General   Post-op Pain Management:    Induction: Intravenous  PONV Risk Score and Plan: 3 and Ondansetron, Dexamethasone and Midazolam  Airway Management Planned: LMA  Additional Equipment:   Intra-op Plan:   Post-operative Plan: Extubation in OR  Informed Consent: I have reviewed the patients History and Physical, chart, labs and discussed the procedure including the risks, benefits and alternatives for the proposed anesthesia with the patient or authorized representative who has indicated his/her understanding and acceptance.     Dental advisory given  Plan Discussed with: CRNA  Anesthesia Plan Comments:         Anesthesia Quick Evaluation

## 2020-11-05 NOTE — Anesthesia Procedure Notes (Signed)
Procedure Name: LMA Insertion Date/Time: 11/05/2020 9:48 AM Performed by: Suan Halter, CRNA Pre-anesthesia Checklist: Patient identified, Emergency Drugs available, Suction available and Patient being monitored Patient Re-evaluated:Patient Re-evaluated prior to induction Oxygen Delivery Method: Circle system utilized Preoxygenation: Pre-oxygenation with 100% oxygen Induction Type: IV induction Ventilation: Mask ventilation without difficulty LMA: LMA inserted LMA Size: 4.0 Number of attempts: 1 Airway Equipment and Method: Bite block Placement Confirmation: positive ETCO2 Tube secured with: Tape Dental Injury: Teeth and Oropharynx as per pre-operative assessment

## 2020-11-05 NOTE — Transfer of Care (Signed)
Immediate Anesthesia Transfer of Care Note  Patient: Patricia Mathews  Procedure(s) Performed: Procedure(s) (LRB): DILATATION & CURETTAGE/HYSTEROSCOPY WITH MYOSURE (N/A)  Patient Location: PACU  Anesthesia Type: General  Level of Consciousness: awake, oriented, sedated and patient cooperative  Airway & Oxygen Therapy: Patient Spontanous Breathing and Patient connected to face mask oxygen  Post-op Assessment: Report given to PACU RN and Post -op Vital signs reviewed and stable  Post vital signs: Reviewed and stable  Complications: No apparent anesthesia complications  Last Vitals:  Vitals Value Taken Time  BP 138/95 11/05/20 1030  Temp 36.5 C 11/05/20 1028  Pulse 81 11/05/20 1040  Resp 14 11/05/20 1040  SpO2 97 % 11/05/20 1040  Vitals shown include unvalidated device data.  Last Pain:  Vitals:   11/05/20 1030  TempSrc:   PainSc: 0-No pain      Patients Stated Pain Goal: 6 (29/79/89 2119)  Complications: No complications documented.

## 2020-11-05 NOTE — Op Note (Addendum)
Operative Note  11/05/2020  10:26 AM  PATIENT:  Patricia Mathews  59 y.o. female  PRE-OPERATIVE DIAGNOSIS:  Endometrial polyp.  H/O Endometrial Ablation.  POST-OPERATIVE DIAGNOSIS:  Endometrial polyp/Endocervical polyp  PROCEDURE:  Procedure(s): DILATATION & CURETTAGE/HYSTEROSCOPY WITH MYOSURE  SURGEON:  Surgeon(s): Princess Bruins, MD  ANESTHESIA:   general  FINDINGS: Scarred intra-uterine cavity.  Polyp, probably endocervical.  DESCRIPTION OF OPERATION: Under general anesthesia with the laryngeal mask, the patient is in lithotomy position.  She is prepped with Betadine on the suprapubic, vulvar and vaginal areas.  She is draped as usual.  Timeout is done.  The bladder is catheterized.  The vaginal exam reveals an anteverted uterus, normal volume, mobile.  No adnexal mass felt.  The speculum is inserted in the vagina and the anterior lip of the cervix is grasped with a tenaculum.  Dilation of the cervix with Pratt dilators up to #21 without difficulty.  Insertion of the hysteroscope in the intra uterine cavity revealing a scarred cavity (secondary to Endometrial Ablation) which was narrower than normal.  No polyps seen at that level.  Polyps are present at the upper endocervical canal.  The light MyoSure is inserted and excision of those polyps is done.  The hysteroscope with MyoSure are removed.  The tenaculum was removed from the cervix.  Hemostasis is adequate.  The speculum is removed.  The patient is brought to recovery room in good and stable status.  ESTIMATED BLOOD LOSS: 10 mL   Intake/Output Summary (Last 24 hours) at 11/05/2020 1026 Last data filed at 11/05/2020 1017 Gross per 24 hour  Intake 359 ml  Output 10 ml  Net 349 ml     BLOOD ADMINISTERED:none   LOCAL MEDICATIONS USED:  LIDOCAINE 1%  SPECIMEN:  Source of Specimen:  Excision specimen, polyp  DISPOSITION OF SPECIMEN:  PATHOLOGY  COUNTS:  YES  PLAN OF CARE: Transfer to PACU  [place]  LavoieMD10:26 AM

## 2020-11-05 NOTE — Discharge Instructions (Addendum)
Hysteroscopy, Care After This sheet gives you information about how to care for yourself after your procedure. Your health care provider may also give you more specific instructions. If you have problems or questions, contact your health care provider. What can I expect after the procedure? After the procedure, it is common to have:  Cramping.  Bleeding. This can vary from light spotting to menstrual-like bleeding. Follow these instructions at home: Activity  Rest for 1-2 days after the procedure.  Do not douche, use tampons, or have sex for 2 weeks after the procedure, or until your health care provider approves.  Do not drive for 24 hours after the procedure, or for as long as told by your health care provider.  Do not drive, use heavy machinery, or drink alcohol while taking prescription pain medicines. Medicines   Take over-the-counter and prescription medicines only as told by your health care provider.  Do not take aspirin during recovery. It can increase the risk of bleeding. General instructions  Do not take baths, swim, or use a hot tub until your health care provider approves. Take showers instead of baths for 2 weeks, or for as long as told by your health care provider.  To prevent or treat constipation while you are taking prescription pain medicine, your health care provider may recommend that you: ? Drink enough fluid to keep your urine clear or pale yellow. ? Take over-the-counter or prescription medicines. ? Eat foods that are high in fiber, such as fresh fruits and vegetables, whole grains, and beans. ? Limit foods that are high in fat and processed sugars, such as fried and sweet foods.  Keep all follow-up visits as told by your health care provider. This is important. Contact a health care provider if:  You feel dizzy or lightheaded.  You feel nauseous.  You have abnormal vaginal discharge.  You have a rash.  You have pain that does not get better with  medicine.  You have chills. Get help right away if:  You have bleeding that is heavier than a normal menstrual period.  You have a fever.  You have pain or cramps that get worse.  You develop new abdominal pain.  You faint.  You have pain in your shoulders.  You have shortness of breath. Summary  After the procedure, you may have cramping and some vaginal bleeding.  Do not douche, use tampons, or have sex for 2 weeks after the procedure, or until your health care provider approves.  Do not take baths, swim, or use a hot tub until your health care provider approves. Take showers instead of baths for 2 weeks, or for as long as told by your health care provider.  Report any unusual symptoms to your health care provider.  Keep all follow-up visits as told by your health care provider. This is important. This information is not intended to replace advice given to you by your health care provider. Make sure you discuss any questions you have with your health care provider. Document Revised: 10/08/2017 Document Reviewed: 11/24/2016 Elsevier Patient Education  [place] Instructions  Activity: Get plenty of rest for the remainder of the day. A responsible individual must stay with you for 24 hours following the procedure.  For the next 24 hours, DO NOT: -Drive a car -Paediatric nurse -Drink alcoholic beverages -Take any medication unless instructed by your physician -Make any legal decisions or sign important papers.  Meals: Start with liquid foods  such as gelatin or soup. Progress to regular foods as tolerated. Avoid greasy, spicy, heavy foods. If nausea and/or vomiting occur, drink only clear liquids until the nausea and/or vomiting subsides. Call your physician if vomiting continues.  Special Instructions/Symptoms: Your throat may feel dry or sore from the anesthesia or the breathing tube placed in your throat during surgery. If this  causes discomfort, gargle with warm salt water. The discomfort should disappear within 24 hours.  May take Tylenol at 1 PM as needed for pain.

## 2020-11-06 ENCOUNTER — Encounter (HOSPITAL_BASED_OUTPATIENT_CLINIC_OR_DEPARTMENT_OTHER): Payer: Self-pay | Admitting: Obstetrics & Gynecology

## 2020-11-06 LAB — SURGICAL PATHOLOGY

## 2020-11-21 ENCOUNTER — Other Ambulatory Visit: Payer: Self-pay | Admitting: *Deleted

## 2020-11-21 MED ORDER — DULOXETINE HCL 30 MG PO CPEP: 30.0000 mg | ORAL_CAPSULE | Freq: Every day | ORAL | 3 refills | Status: DC

## 2020-11-21 MED FILL — DULoxetine HCL 30 MG CPEP: 30 | 90 days supply | Qty: 90 | Fill #0

## 2020-11-28 ENCOUNTER — Other Ambulatory Visit: Payer: Self-pay | Admitting: *Deleted

## 2020-11-28 ENCOUNTER — Encounter: Payer: 59 | Admitting: Obstetrics & Gynecology

## 2020-11-28 MED ORDER — LOSARTAN POTASSIUM-HCTZ 50-12.5 MG PO TABS: 1.0000 | ORAL_TABLET | Freq: Every day | ORAL | 3 refills | Status: DC

## 2020-11-28 MED ORDER — DULOXETINE HCL 30 MG PO CPEP: 30.0000 mg | ORAL_CAPSULE | Freq: Two times a day (BID) | ORAL | 3 refills | Status: DC

## 2020-11-28 MED FILL — LOSARTAN-HCTZ 50-12.5 MG TA: 50-12.5 | 30 days supply | Qty: 30 | Fill #0

## 2020-12-03 MED FILL — DULoxetine HCL 30 MG CPEP: 30 | 45 days supply | Qty: 90 | Fill #0

## 2020-12-04 ENCOUNTER — Ambulatory Visit
Admission: RE | Admit: 2020-12-04 | Discharge: 2020-12-04 | Disposition: A | Discharge status: Home / Self Care | Payer: 59 | Source: Ambulatory Visit | Attending: Obstetrics & Gynecology | Admitting: Obstetrics & Gynecology

## 2020-12-04 ENCOUNTER — Other Ambulatory Visit: Payer: Self-pay

## 2020-12-04 DIAGNOSIS — Z1231 Encounter for screening mammogram for malignant neoplasm of breast: Secondary | ICD-10-CM

## 2020-12-24 MED FILL — ALPRAZolam 0.25 MG TABS: 0.25 | 15 days supply | Qty: 30 | Fill #2

## 2021-01-02 ENCOUNTER — Other Ambulatory Visit: Payer: Self-pay | Admitting: *Deleted

## 2021-01-02 DIAGNOSIS — R911 Solitary pulmonary nodule: Secondary | ICD-10-CM

## 2021-01-03 ENCOUNTER — Ambulatory Visit: Payer: 59 | Admitting: Obstetrics & Gynecology

## 2021-01-16 MED FILL — LOSARTAN-HCTZ 50-12.5 MG TA: 50-12.5 | 30 days supply | Qty: 30 | Fill #1

## 2021-01-16 MED FILL — ROSUVASTATIN CALCIUM 10 MG: 10 | 90 days supply | Qty: 90 | Fill #1

## 2021-01-17 ENCOUNTER — Other Ambulatory Visit: Payer: Self-pay

## 2021-01-17 ENCOUNTER — Ambulatory Visit (HOSPITAL_COMMUNITY)
Admission: RE | Admit: 2021-01-17 | Discharge: 2021-01-17 | Disposition: A | Discharge status: Home / Self Care | Payer: 59 | Source: Ambulatory Visit | Attending: Emergency Medicine | Admitting: Emergency Medicine

## 2021-01-17 DIAGNOSIS — R918 Other nonspecific abnormal finding of lung field: Secondary | ICD-10-CM | POA: Diagnosis not present

## 2021-01-17 DIAGNOSIS — M47814 Spondylosis without myelopathy or radiculopathy, thoracic region: Secondary | ICD-10-CM | POA: Diagnosis not present

## 2021-01-17 DIAGNOSIS — R911 Solitary pulmonary nodule: Secondary | ICD-10-CM | POA: Insufficient documentation

## 2021-01-20 ENCOUNTER — Other Ambulatory Visit: Payer: Self-pay | Admitting: *Deleted

## 2021-01-20 DIAGNOSIS — R911 Solitary pulmonary nodule: Secondary | ICD-10-CM

## 2021-01-30 ENCOUNTER — Other Ambulatory Visit: Payer: Self-pay | Admitting: *Deleted

## 2021-01-30 DIAGNOSIS — R911 Solitary pulmonary nodule: Secondary | ICD-10-CM

## 2021-01-31 ENCOUNTER — Other Ambulatory Visit: Payer: Self-pay

## 2021-01-31 ENCOUNTER — Encounter (HOSPITAL_COMMUNITY)
Admission: RE | Admit: 2021-01-31 | Discharge: 2021-01-31 | Disposition: A | Discharge status: Home / Self Care | Payer: 59 | Source: Ambulatory Visit | Attending: Emergency Medicine | Admitting: Emergency Medicine

## 2021-01-31 DIAGNOSIS — R911 Solitary pulmonary nodule: Secondary | ICD-10-CM | POA: Insufficient documentation

## 2021-01-31 LAB — GLUCOSE, CAPILLARY: Glucose-Capillary: 86 mg/dL (ref 70–99)

## 2021-01-31 MED ORDER — FLUDEOXYGLUCOSE F - 18 (FDG) INJECTION
9.4000 | Freq: Once | INTRAVENOUS | Status: AC | PRN
  Administered 2021-01-31: 9.47 via INTRAVENOUS

## 2021-02-05 ENCOUNTER — Other Ambulatory Visit: Payer: Self-pay

## 2021-02-05 ENCOUNTER — Ambulatory Visit: Payer: 59 | Admitting: Pulmonary Disease

## 2021-02-05 ENCOUNTER — Encounter: Payer: Self-pay | Admitting: Pulmonary Disease

## 2021-02-05 VITALS — BP 140/82 | HR 73 | Temp 97.3°F | Ht 65.0 in | Wt 190.3 lb

## 2021-02-05 DIAGNOSIS — Z87891 Personal history of nicotine dependence: Secondary | ICD-10-CM

## 2021-02-05 DIAGNOSIS — R911 Solitary pulmonary nodule: Secondary | ICD-10-CM

## 2021-02-05 NOTE — Progress Notes (Signed)
Synopsis: Referred in March 2022 for lung nodule by Horald Pollen, *  Subjective:   PATIENT ID: Patricia Mathews GENDER: female DOB: Aug 15, 1961, MRN: [id number]  Chief Complaint  Patient presents with  . Consult    PET done on 03/25 and CT chest done on 03/11.  Denies any cough, SHOB    60 year old female, history of OSA on CPAP, Sjogren's disease, systemic lupus, Dr. Lenna Gilford.  Hypertension and neuropathy.  Referred today for evaluation of lung nodule.  Patient had a CT scan of the chest completed on 01/17/2021 which revealed a peripheral right lower lobe 9 mm solid pulmonary nodule that had increased minimally from 06/29/2019 CT scan.  It was 8 mm then.  Patient had a nuclear medicine PET scan completed on 01/31/2021.  The nuclear medicine PET scan revealed a 9 x 7 mm ovoid nodule of the right lower lobe that was non-FDG avid.  Reading radiologist felt as if to be benign lesion however its right at the threshold for having any significant uptake on PET imaging.  She is a former smoker quit in 1989, smoked for approximately 10 years. Father with non-smoking related lung cancer.   Initial CT imaging of nodule was completed at [facility] in [place] in 2007 and the lesion was 3 mm in size.  Lesion has slowly been growing over time.  2020 the lesion was 8 mm in size and now with nuclear medicine pet imaging follow-up is approximately 9 x 7 mm.   Past Medical History:  Diagnosis Date  . Endometrial polyp   . Fibromyalgia   . GAD (generalized anxiety disorder)   . History of panic attacks   . Hypertension    followed by pcp  . Neuropathy   . OSA on CPAP    followed by dr dohmeier--- per study 02-02-2018 moderate to severe osa  . Sjogren's disease ([place])   . Systemic lupus ([place])    rheumonotologist-- dr aTrudie Reed     Family History  Problem Relation Age of Onset  . Hypertension Mother   . Osteoarthritis Mother   . Cancer Father        PROSTATE  . Hypertension  Father   . Hypertension Sister   . Lupus Sister   . Cancer Brother        PROSTATE  . Lupus Brother   . Hypertension Brother   . Sarcoidosis Paternal Aunt   . Hypertension Brother   . Hypertension Brother   . Breast cancer Neg Hx      Past Surgical History:  Procedure Laterality Date  . [place];  1989  . COLONOSCOPY WITH PROPOFOL  10/2012  . DILATATION & CURETTAGE/HYSTEROSCOPY WITH MYOSURE N/A 11/05/2020   Procedure: DILATATION & CURETTAGE/HYSTEROSCOPY WITH MYOSURE;  Surgeon: Princess Bruins, MD;  Location: [facility];  Service: Gynecology;  Laterality: N/A;  request to follow in [place] block time ~10:00am requests one hour    Social History   Socioeconomic History  . Marital status: Married    Spouse name: Not on file  . Number of children: 2  . Years of education: post-grad  . Highest education level: Not on file  Occupational History  . Not on file  Tobacco Use  . Smoking status: Former Smoker    Years: 10.00    Types: Cigarettes    Quit date: 1989    Years since quitting: 33.2  . Smokeless tobacco: Never Used  Vaping Use  . Vaping  Use: Never used  Substance and Sexual Activity  . Alcohol use: Yes    Comment: occasional  . Drug use: Never  . Sexual activity: Yes    Partners: Male    Birth control/protection: Post-menopausal    Comment: 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17, PARTNERS- 3, MARRIED- 63 YRS   Other Topics Concern  . Not on file  Social History Narrative   Lives at home with her husband   Right handed   2 cups of caffeine daily   Social Determinants of Health   Financial Resource Strain: Not on file  Food Insecurity: Not on file  Transportation Needs: Not on file  Physical Activity: Not on file  Stress: Not on file  Social Connections: Not on file  Intimate Partner Violence: Not on file     Allergies  Allergen Reactions  . Penicillins Anaphylaxis  . Penicillins      Outpatient Medications Prior to Visit   Medication Sig Dispense Refill  . ALPRAZolam (XANAX) 0.25 MG tablet Take 1 tablet (0.25 mg total) by mouth 2 (two) times daily as needed for anxiety. 30 tablet 1  . cholecalciferol (VITAMIN D) 1000 units tablet Take 1,000 Units by mouth daily.    . DULoxetine (CYMBALTA) 30 MG capsule Take 1 capsule (30 mg total) by mouth 2 (two) times daily. 90 capsule 3  . losartan-hydrochlorothiazide (HYZAAR) 50-12.5 MG tablet Take 1 tablet by mouth daily. 90 tablet 3  . rosuvastatin (CRESTOR) 10 MG tablet Take 1 tablet (10 mg total) by mouth daily. 90 tablet 3   No facility-administered medications prior to visit.    Review of Systems  Constitutional: Negative for chills, fever, malaise/fatigue and weight loss.  HENT: Negative for hearing loss, sore throat and tinnitus.   Eyes: Negative for blurred vision and double vision.  Respiratory: Negative for cough, hemoptysis, sputum production, shortness of breath, wheezing and stridor.   Cardiovascular: Negative for chest pain, palpitations, orthopnea, leg swelling and PND.  Gastrointestinal: Negative for abdominal pain, constipation, diarrhea, heartburn, nausea and vomiting.  Genitourinary: Negative for dysuria, hematuria and urgency.  Musculoskeletal: Negative for joint pain and myalgias.  Skin: Negative for itching and rash.  Neurological: Negative for dizziness, tingling, weakness and headaches.  Endo/Heme/Allergies: Negative for environmental allergies. Does not bruise/bleed easily.  Psychiatric/Behavioral: Negative for depression. The patient is nervous/anxious. The patient does not have insomnia.   All other systems reviewed and are negative.    Objective:  Physical Exam Vitals reviewed.  Constitutional:      General: She is not in acute distress.    Appearance: She is well-developed. She is obese.  HENT:     Head: Normocephalic and atraumatic.  Eyes:     General: No scleral icterus.    Conjunctiva/sclera: Conjunctivae normal.     Pupils:  Pupils are equal, round, and reactive to light.  Neck:     Vascular: No JVD.     Trachea: No tracheal deviation.  Cardiovascular:     Rate and Rhythm: Normal rate and regular rhythm.     Heart sounds: Normal heart sounds. No murmur heard.   Pulmonary:     Effort: Pulmonary effort is normal. No tachypnea, accessory muscle usage or respiratory distress.     Breath sounds: Normal breath sounds. No stridor. No wheezing, rhonchi or rales.  Abdominal:     General: Bowel sounds are normal. There is no distension.     Palpations: Abdomen is soft.     Tenderness: There is no abdominal tenderness.  Musculoskeletal:  General: No tenderness.     Cervical back: Neck supple.  Lymphadenopathy:     Cervical: No cervical adenopathy.  Skin:    General: Skin is warm and dry.     Capillary Refill: Capillary refill takes less than 2 seconds.     Findings: No rash.  Neurological:     Mental Status: She is alert and oriented to person, place, and time.  Psychiatric:        Behavior: Behavior normal.      Vitals:   02/05/21 1140  BP: 140/82  Pulse: 73  Temp: (!) 97.3 F (36.3 C)  TempSrc: Tympanic  SpO2: 96%  Weight: 190 lb 5 oz (86.3 kg)  Height: 5\' 5"  (1.651 m)   96% on RA BMI Readings from Last 3 Encounters:  02/05/21 31.67 kg/m  01/31/21 31.62 kg/m  11/05/20 35.31 kg/m   Wt Readings from Last 3 Encounters:  02/05/21 190 lb 5 oz (86.3 kg)  01/31/21 190 lb (86.2 kg)  11/05/20 212 lb 3.2 oz (96.3 kg)     CBC    Component Value Date/Time   WBC 5.6 10/11/2020 0834   WBC 6.0 01/26/2018 1245   RBC 4.23 10/11/2020 0834   RBC 4.51 01/26/2018 1245   RBC 4.44 01/26/2018 1245   HGB 13.9 11/05/2020 0831   HGB 13.3 10/11/2020 0834   HCT 41.0 11/05/2020 0831   HCT 39.2 10/11/2020 0834   PLT 318 10/11/2020 0834   MCV 93 10/11/2020 0834   MCH 31.4 10/11/2020 0834   MCH 31.3 01/26/2018 1245   MCHC 33.9 10/11/2020 0834   MCHC 33.2 01/26/2018 1245   RDW 13.4 10/11/2020  0834   LYMPHSABS 1.6 10/11/2020 0834   MONOABS 0.5 01/26/2018 1245   EOSABS 0.1 10/11/2020 0834   BASOSABS 0.0 10/11/2020 0834    Chest Imaging: 01/31/2021 nuclear medicine PET scan: 9 x 7 mm ovoid right lower lobe non-PET avid nodule. The patient's images have been independently reviewed by me.    2007 CT [facility]:  Report reviewed.  36mm right lower lobe lung nodule   Pulmonary Functions Testing Results: No flowsheet data found.  FeNO:   Pathology:   Echocardiogram:   Heart Catheterization:     Assessment & Plan:     ICD-10-CM   1. Right lower lobe pulmonary nodule  R91.1 Ambulatory referral to Cardiothoracic Surgery    Pulmonary Function Test  2. Former smoker  Z87.891     Discussion:  This is a 60 year old female, former smoker quit in the late 90s.  She has had a lung nodule followed since it was discovered in 2007.  It was 3 mm at this time on CT imaging.  Subsequently the nodules followed up with a repeat scan in 2020 at 8 mm in size.  1 year follow-up with a nuclear medicine pet image which reveals no PET avid uptake however the lesion grown in size to 9 x 7 mm.  Plan: Patient's husband is Dr. Mitchel Honour one of our local primary care physicians.  He was present in the office today and we discussed with patient and him about the nodule characteristics and his current growth pattern. We discussed the solitary pulmonary risk calculator and [facility] prediction models for malignancy. The lesion itself is small and has slowly been growing over time. We discussed various options to include watchful waiting to see if it continues to grow or for considerations of removal. The lesion is peripheral in location.  If  we were going to consider biopsying this lesion I think that the diagnostic yield for its location and size would be low undergoing navigational bronchoscopy alone.  Therefore if we were going to consider truly determining what this lesion is  a combination procedure with thoracic surgery would allow the patient to have 1 anesthetic event.  The alternative would be to go for direct surgical biopsy.  I recommended a referral to thoracic surgery to discuss options to include watchful waiting versus surgical biopsy.   Current Outpatient Medications:  .  ALPRAZolam (XANAX) 0.25 MG tablet, Take 1 tablet (0.25 mg total) by mouth 2 (two) times daily as needed for anxiety., Disp: 30 tablet, Rfl: 1 .  cholecalciferol (VITAMIN D) 1000 units tablet, Take 1,000 Units by mouth daily., Disp: , Rfl:  .  DULoxetine (CYMBALTA) 30 MG capsule, Take 1 capsule (30 mg total) by mouth 2 (two) times daily., Disp: 90 capsule, Rfl: 3 .  losartan-hydrochlorothiazide (HYZAAR) 50-12.5 MG tablet, Take 1 tablet by mouth daily., Disp: 90 tablet, Rfl: 3 .  rosuvastatin (CRESTOR) 10 MG tablet, Take 1 tablet (10 mg total) by mouth daily., Disp: 90 tablet, Rfl: 3  I spent 45 minutes dedicated to the care of this patient on the date of this encounter to include pre-visit review of records, face-to-face time with the patient discussing conditions above, post visit ordering of testing, clinical documentation with the electronic health record, making appropriate referrals as documented, and communicating necessary findings to members of the patients care team.   Garner Nash, DO [facility] 02/05/2021 12:08 PM

## 2021-02-05 NOTE — Patient Instructions (Signed)
Thank you for visiting Dr. Valeta Harms at [facility]. Today we recommend the following:  Orders Placed This Encounter  Procedures  . Ambulatory referral to Cardiothoracic Surgery  . Pulmonary Function Test   Return in about 3 months (around 05/08/2021) for Dr. Valeta Harms .    Please do your part to reduce the spread of COVID-19.

## 2021-02-08 ENCOUNTER — Other Ambulatory Visit (HOSPITAL_COMMUNITY): Payer: Self-pay

## 2021-02-14 ENCOUNTER — Other Ambulatory Visit (HOSPITAL_COMMUNITY): Payer: Self-pay

## 2021-02-14 ENCOUNTER — Encounter: Payer: 59 | Admitting: Thoracic Surgery (Cardiothoracic Vascular Surgery)

## 2021-02-14 MED FILL — Losartan Potassium & Hydrochlorothiazide Tab 50-12.5 MG: ORAL | 30 days supply | Qty: 30 | Fill #0 | Status: AC

## 2021-03-06 MED FILL — Duloxetine HCl Enteric Coated Pellets Cap 30 MG (Base Eq): ORAL | 45 days supply | Qty: 90 | Fill #0 | Status: AC

## 2021-03-06 MED FILL — Alprazolam Tab 0.25 MG: ORAL | 15 days supply | Qty: 30 | Fill #0 | Status: AC

## 2021-03-07 ENCOUNTER — Other Ambulatory Visit: Payer: Self-pay

## 2021-03-07 ENCOUNTER — Other Ambulatory Visit (HOSPITAL_COMMUNITY): Payer: Self-pay

## 2021-03-07 ENCOUNTER — Encounter: Payer: Self-pay | Admitting: *Deleted

## 2021-03-07 ENCOUNTER — Encounter: Payer: Self-pay | Admitting: Thoracic Surgery (Cardiothoracic Vascular Surgery)

## 2021-03-07 ENCOUNTER — Other Ambulatory Visit: Payer: Self-pay | Admitting: Thoracic Surgery (Cardiothoracic Vascular Surgery)

## 2021-03-07 ENCOUNTER — Other Ambulatory Visit: Payer: Self-pay | Admitting: *Deleted

## 2021-03-07 ENCOUNTER — Institutional Professional Consult (permissible substitution): Payer: 59 | Admitting: Thoracic Surgery (Cardiothoracic Vascular Surgery)

## 2021-03-07 VITALS — BP 130/82 | HR 90 | Resp 20 | Ht 65.0 in | Wt 195.0 lb

## 2021-03-07 DIAGNOSIS — R911 Solitary pulmonary nodule: Secondary | ICD-10-CM

## 2021-03-07 NOTE — Progress Notes (Signed)
Patricia Mathews       Patricia Mathews             (220) Mathews                    Patricia Mathews Patricia Mathews Date of Birth: [birth year]  Referring: Patricia Nash, DO Primary Care: Patricia Pollen, MD Primary Cardiologist: No primary care provider on file.  Chief Complaint:    Chief Complaint  Patient presents with  . Lung Lesion    Surgical consult, PET Scan 01/31/21, Chest CT 01/31/21    History of Present Illness:    Patricia Mathews 60 y.o. female referred by Dr. Valeta Mathews for surgical evaluation of a 9 mm right lower lobe pulmonary nodule.  This was originally found on a coronary CT and has been followed for several years.  She recently underwent a PET/CT which showed minimal uptake.  There is also not been much change in size since 2020.  She quit tobacco use in 1995, and previously only smoked about half a pack a day.  She does have a family history of primary lung cancer in non-smokers that she is very concerned about this.   She denies any pleuritic or respiratory symptoms.  Her weight has been stable.  She denies any neurologic symptoms.   Zubrod Score: At the time of surgery this patient's most appropriate activity status/level should be described as: [x]     0    Normal activity, no symptoms []     1    Restricted in physical strenuous activity but ambulatory, able to do out light work []     2    Ambulatory and capable of self care, unable to do work activities, up and about               >50 % of waking hours                              []     3    Only limited self care, in bed greater than 50% of waking hours []     4    Completely disabled, no self care, confined to bed or chair []     5    Moribund   Past Medical History:  Diagnosis Date  . Endometrial polyp   . Fibromyalgia   . GAD (generalized anxiety disorder)   . History of panic attacks   . Hypertension    followed by pcp  . Neuropathy   . OSA on  CPAP    followed by dr dohmeier--- per study 02-02-2018 moderate to severe osa  . Sjogren's disease ([place])   . Systemic lupus ([place])    rheumonotologist-- dr Page Spiro    Past Surgical History:  Procedure Laterality Date  . [place];  1989  . COLONOSCOPY WITH PROPOFOL  10/2012  . DILATATION & CURETTAGE/HYSTEROSCOPY WITH MYOSURE N/A 11/05/2020   Procedure: DILATATION & CURETTAGE/HYSTEROSCOPY WITH MYOSURE;  Surgeon: Princess Bruins, MD;  Location: [facility];  Service: Gynecology;  Laterality: N/A;  request to follow in [place] block time ~10:00am requests one hour    Family History  Problem Relation Age of Onset  . Hypertension Mother   . Osteoarthritis Mother   . Cancer Father        PROSTATE  . Hypertension Father   . Hypertension  Sister   . Lupus Sister   . Cancer Brother        PROSTATE  . Lupus Brother   . Hypertension Brother   . Sarcoidosis Paternal Aunt   . Hypertension Brother   . Hypertension Brother   . Breast cancer Neg Hx      Social History   Tobacco Use  Smoking Status Former Smoker  . Years: 10.00  . Types: Cigarettes  . Quit date: 8  . Years since quitting: 33.3  Smokeless Tobacco Never Used    Social History   Substance and Sexual Activity  Alcohol Use Yes   Comment: occasional     Allergies  Allergen Reactions  . Penicillins Anaphylaxis  . Penicillins     Current Outpatient Medications  Medication Sig Dispense Refill  . ALPRAZolam (XANAX) 0.25 MG tablet TAKE 1 TABLET (0.25 MG TOTAL) BY MOUTH 2 (TWO) TIMES DAILY AS NEEDED FOR ANXIETY. 30 tablet 1  . cholecalciferol (VITAMIN D) 1000 units tablet Take 1,000 Units by mouth daily.    . DULoxetine (CYMBALTA) 30 MG capsule TAKE 1 CAPSULE (30 MG TOTAL) BY MOUTH 2 (TWO) TIMES DAILY. 90 capsule 3  . losartan-hydrochlorothiazide (HYZAAR) 50-12.5 MG tablet TAKE 1 TABLET BY MOUTH DAILY. 90 tablet 3  . rosuvastatin (CRESTOR) 10 MG tablet TAKE 1 TABLET (10  MG TOTAL) BY MOUTH DAILY. 90 tablet 3   No current facility-administered medications for this visit.    Review of Systems  Constitutional: Negative.   Respiratory: Negative for sputum production and shortness of breath.   Cardiovascular: Negative for chest pain.  Neurological: Negative.   Psychiatric/Behavioral: The patient is nervous/anxious.      PHYSICAL EXAMINATION: BP 130/82   Pulse 90   Resp 20   Ht 5\' 5"  (1.651 m)   Wt 195 lb (88.5 kg)   SpO2 95% Comment: RA  BMI 32.45 kg/m  Physical Exam Constitutional:      General: She is not in acute distress.    Appearance: Normal appearance. She is normal weight. She is not ill-appearing or toxic-appearing.  Eyes:     Extraocular Movements: Extraocular movements intact.  Cardiovascular:     Rate and Rhythm: Normal rate and regular rhythm.  Pulmonary:     Effort: Pulmonary effort is normal. No respiratory distress.  Musculoskeletal:        General: Normal range of motion.     Cervical back: Normal range of motion.  Skin:    General: Skin is warm and dry.  Neurological:     General: No focal deficit present.     Mental Status: She is alert and oriented to person, place, and time.     Diagnostic Studies & Laboratory data:     Recent Radiology Findings:   No results found.     I have independently reviewed the above radiology studies  and reviewed the findings with the patient.   Recent Lab Findings: Lab Results  Component Value Date   WBC 5.6 10/11/2020   HGB 13.9 11/05/2020   HCT 41.0 11/05/2020   PLT 318 10/11/2020   GLUCOSE 104 (H) 11/05/2020   CHOL 235 (H) 10/11/2020   TRIG 97 10/11/2020   HDL 79 10/11/2020   LDLCALC 139 (H) 10/11/2020   ALT 20 10/11/2020   AST 13 10/11/2020   NA 141 11/05/2020   K 3.8 11/05/2020   CL 101 11/05/2020   CREATININE 0.70 11/05/2020   BUN 15 11/05/2020   CO2 22 10/11/2020   TSH  1.150 10/11/2020   HGBA1C 5.6 10/11/2020     PFTs: Pending   Problem List: 9 mm  right lower lobe pulmonary nodule History of lupus History of Sjogren's disease  Assessment / Plan:   60 year old female with a 9 mm right lower lobe pulmonary nodule that is essentially been stable for the past 2 years.  She has a remote smoking history.  My view of the images the nodule does have very smooth borders, thus I am less concerned about this being a primary lung cancer.  That being said I think that proceeding directly with a surgical wedge resection would be ideal.  Furthermore explained to her that if this does prove to be a primary lung cancer then we will proceed with a lobectomy at the same time.  It is quite peripheral thus I do not think that we will need to attempt any navigational bronchoscopy for marking or biopsy.  She is in agreement to proceed, and she is tentatively scheduled for May 18 for a robotic assisted right lower lobe wedge resection possible lobectomy.     I  spent 40 minutes with  the patient face to face in counseling and coordination of care.    Lajuana Matte 03/07/2021 2:05 PM

## 2021-03-07 NOTE — H&P (View-Only) (Signed)
[address]       [place] [address]             [phone]                    Andria E [facility] Medical Record #[id number] Date of Birth: 04-11-1961  Referring: Garner Nash, DO Primary Care: Horald Pollen, MD Primary Cardiologist: No primary care provider on file.  Chief Complaint:    Chief Complaint  Patient presents with  . Lung Lesion    Surgical consult, PET Scan 01/31/21, Chest CT 01/31/21    History of Present Illness:    Patricia Mathews 60 y.o. female referred by Dr. Valeta Harms for surgical evaluation of a 9 mm right lower lobe pulmonary nodule.  This was originally found on a coronary CT and has been followed for several years.  She recently underwent a PET/CT which showed minimal uptake.  There is also not been much change in size since 2020.  She quit tobacco use in 1995, and previously only smoked about half a pack a day.  She does have a family history of primary lung cancer in non-smokers that she is very concerned about this.   She denies any pleuritic or respiratory symptoms.  Her weight has been stable.  She denies any neurologic symptoms.   Zubrod Score: At the time of surgery this patient's most appropriate activity status/level should be described as: [x]     0    Normal activity, no symptoms []     1    Restricted in physical strenuous activity but ambulatory, able to do out light work []     2    Ambulatory and capable of self care, unable to do work activities, up and about               >50 % of waking hours                              []     3    Only limited self care, in bed greater than 50% of waking hours []     4    Completely disabled, no self care, confined to bed or chair []     5    Moribund   Past Medical History:  Diagnosis Date  . Endometrial polyp   . Fibromyalgia   . GAD (generalized anxiety disorder)   . History of panic attacks   . Hypertension    followed by pcp  . Neuropathy   . OSA on  CPAP    followed by dr dohmeier--- per study 02-02-2018 moderate to severe osa  . Sjogren's disease ([place])   . Systemic lupus ([place])    rheumonotologist-- dr Page Spiro    Past Surgical History:  Procedure Laterality Date  . [place];  1989  . COLONOSCOPY WITH PROPOFOL  10/2012  . DILATATION & CURETTAGE/HYSTEROSCOPY WITH MYOSURE N/A 11/05/2020   Procedure: DILATATION & CURETTAGE/HYSTEROSCOPY WITH MYOSURE;  Surgeon: Princess Bruins, MD;  Location: [facility];  Service: Gynecology;  Laterality: N/A;  request to follow in [place] block time ~10:00am requests one hour    Family History  Problem Relation Age of Onset  . Hypertension Mother   . Osteoarthritis Mother   . Cancer Father        PROSTATE  . Hypertension Father   . Hypertension  Sister   . Lupus Sister   . Cancer Brother        PROSTATE  . Lupus Brother   . Hypertension Brother   . Sarcoidosis Paternal Aunt   . Hypertension Brother   . Hypertension Brother   . Breast cancer Neg Hx      Social History   Tobacco Use  Smoking Status Former Smoker  . Years: 10.00  . Types: Cigarettes  . Quit date: 55  . Years since quitting: 33.3  Smokeless Tobacco Never Used    Social History   Substance and Sexual Activity  Alcohol Use Yes   Comment: occasional     Allergies  Allergen Reactions  . Penicillins Anaphylaxis  . Penicillins     Current Outpatient Medications  Medication Sig Dispense Refill  . ALPRAZolam (XANAX) 0.25 MG tablet TAKE 1 TABLET (0.25 MG TOTAL) BY MOUTH 2 (TWO) TIMES DAILY AS NEEDED FOR ANXIETY. 30 tablet 1  . cholecalciferol (VITAMIN D) 1000 units tablet Take 1,000 Units by mouth daily.    . DULoxetine (CYMBALTA) 30 MG capsule TAKE 1 CAPSULE (30 MG TOTAL) BY MOUTH 2 (TWO) TIMES DAILY. 90 capsule 3  . losartan-hydrochlorothiazide (HYZAAR) 50-12.5 MG tablet TAKE 1 TABLET BY MOUTH DAILY. 90 tablet 3  . rosuvastatin (CRESTOR) 10 MG tablet TAKE 1 TABLET (10  MG TOTAL) BY MOUTH DAILY. 90 tablet 3   No current facility-administered medications for this visit.    Review of Systems  Constitutional: Negative.   Respiratory: Negative for sputum production and shortness of breath.   Cardiovascular: Negative for chest pain.  Neurological: Negative.   Psychiatric/Behavioral: The patient is nervous/anxious.      PHYSICAL EXAMINATION: BP 130/82   Pulse 90   Resp 20   Ht 5\' 5"  (1.651 m)   Wt 195 lb (88.5 kg)   SpO2 95% Comment: RA  BMI 32.45 kg/m  Physical Exam Constitutional:      General: She is not in acute distress.    Appearance: Normal appearance. She is normal weight. She is not ill-appearing or toxic-appearing.  Eyes:     Extraocular Movements: Extraocular movements intact.  Cardiovascular:     Rate and Rhythm: Normal rate and regular rhythm.  Pulmonary:     Effort: Pulmonary effort is normal. No respiratory distress.  Musculoskeletal:        General: Normal range of motion.     Cervical back: Normal range of motion.  Skin:    General: Skin is warm and dry.  Neurological:     General: No focal deficit present.     Mental Status: She is alert and oriented to person, place, and time.     Diagnostic Studies & Laboratory data:     Recent Radiology Findings:   No results found.     I have independently reviewed the above radiology studies  and reviewed the findings with the patient.   Recent Lab Findings: Lab Results  Component Value Date   WBC 5.6 10/11/2020   HGB 13.9 11/05/2020   HCT 41.0 11/05/2020   PLT 318 10/11/2020   GLUCOSE 104 (H) 11/05/2020   CHOL 235 (H) 10/11/2020   TRIG 97 10/11/2020   HDL 79 10/11/2020   LDLCALC 139 (H) 10/11/2020   ALT 20 10/11/2020   AST 13 10/11/2020   NA 141 11/05/2020   K 3.8 11/05/2020   CL 101 11/05/2020   CREATININE 0.70 11/05/2020   BUN 15 11/05/2020   CO2 22 10/11/2020   TSH  1.150 10/11/2020   HGBA1C 5.6 10/11/2020     PFTs: Pending   Problem List: 9 mm  right lower lobe pulmonary nodule History of lupus History of Sjogren's disease  Assessment / Plan:   60 year old female with a 9 mm right lower lobe pulmonary nodule that is essentially been stable for the past 2 years.  She has a remote smoking history.  My view of the images the nodule does have very smooth borders, thus I am less concerned about this being a primary lung cancer.  That being said I think that proceeding directly with a surgical wedge resection would be ideal.  Furthermore explained to her that if this does prove to be a primary lung cancer then we will proceed with a lobectomy at the same time.  It is quite peripheral thus I do not think that we will need to attempt any navigational bronchoscopy for marking or biopsy.  She is in agreement to proceed, and she is tentatively scheduled for May 18 for a robotic assisted right lower lobe wedge resection possible lobectomy.     I  spent 40 minutes with  the patient face to face in counseling and coordination of care.    Lajuana Matte 03/07/2021 2:05 PM

## 2021-03-10 ENCOUNTER — Other Ambulatory Visit (HOSPITAL_COMMUNITY)
Admission: RE | Admit: 2021-03-10 | Discharge: 2021-03-10 | Disposition: A | Discharge status: Home / Self Care | Payer: 59 | Source: Ambulatory Visit | Attending: Thoracic Surgery (Cardiothoracic Vascular Surgery) | Admitting: Thoracic Surgery (Cardiothoracic Vascular Surgery)

## 2021-03-10 DIAGNOSIS — Z01812 Encounter for preprocedural laboratory examination: Secondary | ICD-10-CM | POA: Insufficient documentation

## 2021-03-10 DIAGNOSIS — Z20822 Contact with and (suspected) exposure to covid-19: Secondary | ICD-10-CM | POA: Diagnosis not present

## 2021-03-11 LAB — SARS CORONAVIRUS 2 (TAT 6-24 HRS): SARS Coronavirus 2: NEGATIVE

## 2021-03-12 ENCOUNTER — Other Ambulatory Visit: Payer: Self-pay

## 2021-03-12 ENCOUNTER — Ambulatory Visit (INDEPENDENT_AMBULATORY_CARE_PROVIDER_SITE_OTHER): Payer: 59 | Admitting: Pulmonary Disease

## 2021-03-12 DIAGNOSIS — R911 Solitary pulmonary nodule: Secondary | ICD-10-CM | POA: Diagnosis not present

## 2021-03-12 NOTE — Progress Notes (Signed)
PFT done today. 

## 2021-03-13 LAB — PULMONARY FUNCTION TEST
DL/VA % pred: 117 %
DL/VA: 5.06 ml/min/mmHg/L
DLCO cor % pred: 84 %
DLCO cor: 17.83 ml/min/mmHg
DLCO unc % pred: 84 %
DLCO unc: 17.83 ml/min/mmHg
FEF 25-75 Post: 4.23 L/sec
FEF 25-75 Pre: 3.51 L/sec
FEF2575-%Change-Post: 20 %
FEF2575-%Pred-Post: 151 %
FEF2575-%Pred-Pre: 125 %
FEV1-%Change-Post: 12 %
FEV1-%Pred-Post: 86 %
FEV1-%Pred-Pre: 76 %
FEV1-Post: 2.45 L
FEV1-Pre: 2.18 L
FEV1FVC-%Change-Post: 0 %
FEV1FVC-%Pred-Pre: 111 %
FEV6-%Change-Post: 12 %
FEV6-%Pred-Post: 79 %
FEV6-%Pred-Pre: 70 %
FEV6-Post: 2.74 L
FEV6-Pre: 2.44 L
FEV6FVC-%Pred-Post: 102 %
FEV6FVC-%Pred-Pre: 102 %
FVC-%Change-Post: 12 %
FVC-%Pred-Post: 77 %
FVC-%Pred-Pre: 68 %
FVC-Post: 2.74 L
FVC-Pre: 2.44 L
Post FEV1/FVC ratio: 89 %
Post FEV6/FVC ratio: 100 %
Pre FEV1/FVC ratio: 89 %
Pre FEV6/FVC Ratio: 100 %
RV % pred: 87 %
RV: 1.55 L
TLC % pred: 81 %
TLC: 4.12 L

## 2021-03-14 ENCOUNTER — Other Ambulatory Visit: Payer: Self-pay

## 2021-03-14 ENCOUNTER — Encounter: Payer: Self-pay | Admitting: Obstetrics & Gynecology

## 2021-03-14 ENCOUNTER — Ambulatory Visit (INDEPENDENT_AMBULATORY_CARE_PROVIDER_SITE_OTHER): Payer: 59 | Admitting: Obstetrics & Gynecology

## 2021-03-14 VITALS — BP 132/82 | Ht 65.0 in

## 2021-03-14 DIAGNOSIS — E6609 Other obesity due to excess calories: Secondary | ICD-10-CM | POA: Diagnosis not present

## 2021-03-14 DIAGNOSIS — Z780 Asymptomatic menopausal state: Secondary | ICD-10-CM | POA: Diagnosis not present

## 2021-03-14 DIAGNOSIS — Z01419 Encounter for gynecological examination (general) (routine) without abnormal findings: Secondary | ICD-10-CM | POA: Diagnosis not present

## 2021-03-14 DIAGNOSIS — Z6832 Body mass index (BMI) 32.0-32.9, adult: Secondary | ICD-10-CM | POA: Diagnosis not present

## 2021-03-14 NOTE — Progress Notes (Signed)
Patricia Mathews [birth year] [id number]   History:    60 y.o. G2P2 Married. Husband is a Engineer, drilling. From [place]. Speaks both [place] and [place].  RP: Established patient presentingfor annual gyn exam   HPI: Postmenopause x about 9 years, well on no HRT. No PMB.  Had [place] Removal of Polyp/D+C 10/2020.  No pelvic pain.  No pain with IC.  Pap Neg 09/2020.Followed for Sjogren syndrome and Lupus. On cymbalta and Lisinopril. Breasts wnl. Screening mammogram Neg 11/2020. Mictions/BMs wnl.  BMI 32.45.  Changed to a healthier diet with lower sugar/cholest/salt.  Health labs with Fam MD.  Harriet Masson 2013, will schedule.  Rt lung nodule, Robotic excision scheduled next week.   Past medical history,surgical history, family history and social history were all reviewed and documented in the EPIC chart.  Gynecologic History No LMP recorded. Patient is postmenopausal.  Obstetric History OB History  Gravida Para Term Preterm AB Living  2 2       2   SAB IAB Ectopic Multiple Live Births               # Outcome Date GA Lbr Len/2nd Weight Sex Delivery Anes PTL Lv  2 Para           1 Para              ROS: A ROS was performed and pertinent positives and negatives are included in the history.  GENERAL: No fevers or chills. HEENT: No change in vision, no earache, sore throat or sinus congestion. NECK: No pain or stiffness. CARDIOVASCULAR: No chest pain or pressure. No palpitations. PULMONARY: No shortness of breath, cough or wheeze. GASTROINTESTINAL: No abdominal pain, nausea, vomiting or diarrhea, melena or bright red blood per rectum. GENITOURINARY: No urinary frequency, urgency, hesitancy or dysuria. MUSCULOSKELETAL: No joint or muscle pain, no back pain, no recent trauma. DERMATOLOGIC: No rash, no itching, no lesions. ENDOCRINE: No polyuria, polydipsia, no heat or cold intolerance. No recent change in weight. HEMATOLOGICAL: No anemia or easy bruising or bleeding. NEUROLOGIC: No headache,  seizures, numbness, tingling or weakness. PSYCHIATRIC: No depression, no loss of interest in normal activity or change in sleep pattern.     Exam:   BP 132/82   Ht 5\' 5"  (1.651 m)   BMI 32.45 kg/m   Body mass index is 32.45 kg/m.  General appearance : Well developed well nourished female. No acute distress HEENT: Eyes: no retinal hemorrhage or exudates,  Neck supple, trachea midline, no carotid bruits, no thyroidmegaly Lungs: Clear to auscultation, no rhonchi or wheezes, or rib retractions  Heart: Regular rate and rhythm, no murmurs or gallops Breast:Examined in sitting and supine position were symmetrical in appearance, no palpable masses or tenderness,  no skin retraction, no nipple inversion, no nipple discharge, no skin discoloration, no axillary or supraclavicular lymphadenopathy Abdomen: no palpable masses or tenderness, no rebound or guarding Extremities: no edema or skin discoloration or tenderness  Pelvic: Vulva: Normal             Vagina: No gross lesions or discharge  Cervix: No gross lesions or discharge  Uterus  AV, normal size, shape and consistency, non-tender and mobile  Adnexa  Without masses or tenderness  Anus: Normal   Assessment/Plan:  60 y.o. female for annual exam   1. Well female exam with routine gynecological exam Normal gynecologic exam in menopause.  No indication for Pap test this year.  Breast exam normal.  Screening mammogram negative January 2022.  Colonoscopy  2013.  Health labs with family physician.  2. Postmenopause Well on no hormone replacement therapy.  No postmenopausal bleeding.  Bone density December 2021 was normal.  Vitamin D supplements, calcium intake of 1.5 g/day total and regular weightbearing physical activities recommended.  3. Class 1 obesity due to excess calories with serious comorbidity and body mass index (BMI) of 32.0 to 32.9 in adult Recommend a lower calorie/carb diet.  Aerobic activities 5 times a week and light  weightlifting every 2 days.  Princess Bruins MD, 8:48 AM 03/14/2021

## 2021-03-17 ENCOUNTER — Encounter: Payer: Self-pay | Admitting: Obstetrics & Gynecology

## 2021-03-21 ENCOUNTER — Other Ambulatory Visit (HOSPITAL_COMMUNITY): Payer: Self-pay

## 2021-03-21 MED FILL — Losartan Potassium & Hydrochlorothiazide Tab 50-12.5 MG: ORAL | 90 days supply | Qty: 90 | Fill #1 | Status: AC

## 2021-03-21 NOTE — Pre-Procedure Instructions (Signed)
Surgical Instructions    Your procedure is scheduled on Wednesday, May 18th.  Report to [facility] Main Entrance "A" at 6:30 A.M., then check in with the Admitting office.  Call this number if you have problems the morning of surgery:  [phone]   If you have any questions prior to your surgery date call [phone]: Open Monday-Friday 8am-4pm    Remember:  Do not eat or drink after midnight the night before your surgery   Take these medicines the morning of surgery with A SIP OF WATER  rosuvastatin (CRESTOR)    Take these medications AS NEEDED: acetaminophen (TYLENOL) ALPRAZolam (XANAX)  DULoxetine (CYMBALTA)   As of today, STOP taking any Aspirin (unless otherwise instructed by your surgeon) Aleve, Naproxen, Ibuprofen, Motrin, Advil, Goody's, BC's, all herbal medications, fish oil, and all vitamins.                     Do NOT Smoke (Tobacco/Vaping) or drink Alcohol 24 hours prior to your procedure.  If you use a CPAP at night, you may bring all equipment for your overnight stay.   Contacts, glasses, piercing's, hearing aid's, dentures or partials may not be worn into surgery, please bring cases for these belongings.    For patients admitted to the hospital, discharge time will be determined by your treatment team.   Patients discharged the day of surgery will not be allowed to drive home, and someone needs to stay with them for 24 hours.    Special instructions:   [facility]- Preparing For Surgery  Before surgery, you can play an important role. Because skin is not sterile, your skin needs to be as free of germs as possible. You can reduce the number of germs on your skin by washing with CHG (chlorahexidine gluconate) Soap before surgery.  CHG is an antiseptic cleaner which kills germs and bonds with the skin to continue killing germs even after washing.    Oral Hygiene is also important to reduce your risk of infection.  Remember - BRUSH YOUR TEETH THE MORNING OF  SURGERY WITH YOUR REGULAR TOOTHPASTE  Please do not use if you have an allergy to CHG or antibacterial soaps. If your skin becomes reddened/irritated stop using the CHG.  Do not shave (including legs and underarms) for at least 48 hours prior to first CHG shower. It is OK to shave your face.  Please follow these instructions carefully.   1. Shower the NIGHT BEFORE SURGERY and the MORNING OF SURGERY  2. If you chose to wash your hair, wash your hair first as usual with your normal shampoo.  3. After you shampoo, rinse your hair and body thoroughly to remove the shampoo.  4. Use CHG Soap as you would any other liquid soap. You can apply CHG directly to the skin and wash gently with a scrungie or a clean washcloth.   5. Apply the CHG Soap to your body ONLY FROM THE NECK DOWN.  Do not use on open wounds or open sores. Avoid contact with your eyes, ears, mouth and genitals (private parts). Wash Face and genitals (private parts)  with your normal soap.   6. Wash thoroughly, paying special attention to the area where your surgery will be performed.  7. Thoroughly rinse your body with warm water from the neck down.  8. DO NOT shower/wash with your normal soap after using and rinsing off the CHG Soap.  9. Pat yourself dry with a CLEAN TOWEL.  10. Wear  CLEAN PAJAMAS to bed the night before surgery  11. Place CLEAN SHEETS on your bed the night before your surgery  12. DO NOT SLEEP WITH PETS.   Day of Surgery: Shower with CHG soap. Do not wear jewelry, make up, or nail polish Do not wear lotions, powders, perfumes, or deodorant. Do not shave 48 hours prior to surgery.  Do not bring valuables to the hospital. [facility] is not responsible for any belongings or valuables. Wear Clean/Comfortable clothing the morning of surgery Remember to brush your teeth WITH YOUR REGULAR TOOTHPASTE.   Please read over the following fact sheets that you were given.

## 2021-03-24 ENCOUNTER — Other Ambulatory Visit: Payer: Self-pay

## 2021-03-24 ENCOUNTER — Encounter (HOSPITAL_COMMUNITY)
Admission: RE | Admit: 2021-03-24 | Discharge: 2021-03-24 | Disposition: A | Discharge status: Home / Self Care | Payer: 59 | Source: Ambulatory Visit | Attending: Thoracic Surgery (Cardiothoracic Vascular Surgery) | Admitting: Thoracic Surgery (Cardiothoracic Vascular Surgery)

## 2021-03-24 ENCOUNTER — Encounter (HOSPITAL_COMMUNITY): Payer: Self-pay

## 2021-03-24 ENCOUNTER — Ambulatory Visit (HOSPITAL_COMMUNITY)
Admission: RE | Admit: 2021-03-24 | Discharge: 2021-03-24 | Disposition: A | Discharge status: Home / Self Care | Payer: 59 | Source: Ambulatory Visit | Attending: Thoracic Surgery (Cardiothoracic Vascular Surgery) | Admitting: Thoracic Surgery (Cardiothoracic Vascular Surgery)

## 2021-03-24 ENCOUNTER — Other Ambulatory Visit (HOSPITAL_COMMUNITY): Payer: 59

## 2021-03-24 DIAGNOSIS — I4510 Unspecified right bundle-branch block: Secondary | ICD-10-CM | POA: Insufficient documentation

## 2021-03-24 DIAGNOSIS — G4733 Obstructive sleep apnea (adult) (pediatric): Secondary | ICD-10-CM | POA: Diagnosis not present

## 2021-03-24 DIAGNOSIS — Z801 Family history of malignant neoplasm of trachea, bronchus and lung: Secondary | ICD-10-CM | POA: Diagnosis not present

## 2021-03-24 DIAGNOSIS — M3500 Sicca syndrome, unspecified: Secondary | ICD-10-CM | POA: Diagnosis not present

## 2021-03-24 DIAGNOSIS — J9383 Other pneumothorax: Secondary | ICD-10-CM | POA: Diagnosis not present

## 2021-03-24 DIAGNOSIS — Z01818 Encounter for other preprocedural examination: Secondary | ICD-10-CM | POA: Insufficient documentation

## 2021-03-24 DIAGNOSIS — Z20822 Contact with and (suspected) exposure to covid-19: Secondary | ICD-10-CM | POA: Insufficient documentation

## 2021-03-24 DIAGNOSIS — F411 Generalized anxiety disorder: Secondary | ICD-10-CM | POA: Diagnosis not present

## 2021-03-24 DIAGNOSIS — D3A090 Benign carcinoid tumor of the bronchus and lung: Secondary | ICD-10-CM | POA: Diagnosis not present

## 2021-03-24 DIAGNOSIS — J939 Pneumothorax, unspecified: Secondary | ICD-10-CM | POA: Diagnosis not present

## 2021-03-24 DIAGNOSIS — I517 Cardiomegaly: Secondary | ICD-10-CM | POA: Diagnosis not present

## 2021-03-24 DIAGNOSIS — I10 Essential (primary) hypertension: Secondary | ICD-10-CM | POA: Diagnosis not present

## 2021-03-24 DIAGNOSIS — R911 Solitary pulmonary nodule: Secondary | ICD-10-CM

## 2021-03-24 DIAGNOSIS — Z9989 Dependence on other enabling machines and devices: Secondary | ICD-10-CM | POA: Diagnosis not present

## 2021-03-24 DIAGNOSIS — Z8249 Family history of ischemic heart disease and other diseases of the circulatory system: Secondary | ICD-10-CM | POA: Diagnosis not present

## 2021-03-24 DIAGNOSIS — Z87891 Personal history of nicotine dependence: Secondary | ICD-10-CM | POA: Diagnosis not present

## 2021-03-24 DIAGNOSIS — C7A090 Malignant carcinoid tumor of the bronchus and lung: Secondary | ICD-10-CM | POA: Diagnosis not present

## 2021-03-24 DIAGNOSIS — Z4682 Encounter for fitting and adjustment of non-vascular catheter: Secondary | ICD-10-CM | POA: Diagnosis not present

## 2021-03-24 DIAGNOSIS — J9811 Atelectasis: Secondary | ICD-10-CM | POA: Diagnosis not present

## 2021-03-24 DIAGNOSIS — C3431 Malignant neoplasm of lower lobe, right bronchus or lung: Secondary | ICD-10-CM | POA: Diagnosis not present

## 2021-03-24 DIAGNOSIS — M797 Fibromyalgia: Secondary | ICD-10-CM | POA: Diagnosis not present

## 2021-03-24 DIAGNOSIS — M329 Systemic lupus erythematosus, unspecified: Secondary | ICD-10-CM | POA: Diagnosis not present

## 2021-03-24 DIAGNOSIS — Z880 Allergy status to penicillin: Secondary | ICD-10-CM | POA: Diagnosis not present

## 2021-03-24 DIAGNOSIS — Z832 Family history of diseases of the blood and blood-forming organs and certain disorders involving the immune mechanism: Secondary | ICD-10-CM | POA: Diagnosis not present

## 2021-03-24 LAB — URINALYSIS, ROUTINE W REFLEX MICROSCOPIC
Bilirubin Urine: NEGATIVE
Glucose, UA: NEGATIVE mg/dL
Hgb urine dipstick: NEGATIVE
Ketones, ur: NEGATIVE mg/dL
Leukocytes,Ua: NEGATIVE
Nitrite: NEGATIVE
Protein, ur: NEGATIVE mg/dL
Specific Gravity, Urine: 1.025 (ref 1.005–1.030)
pH: 5 (ref 5.0–8.0)

## 2021-03-24 LAB — COMPREHENSIVE METABOLIC PANEL
ALT: 28 U/L (ref 0–44)
AST: 24 U/L (ref 15–41)
Albumin: 4 g/dL (ref 3.5–5.0)
Alkaline Phosphatase: 62 U/L (ref 38–126)
Anion gap: 9 (ref 5–15)
BUN: 16 mg/dL (ref 8–23)
CO2: 23 mmol/L (ref 22–32)
Calcium: 9 mg/dL (ref 8.9–10.3)
Chloride: 103 mmol/L (ref 98–111)
Creatinine, Ser: 0.72 mg/dL (ref 0.44–1.00)
GFR, Estimated: >60 mL/min (ref >60)
Glucose, Bld: 104 mg/dL — ABNORMAL HIGH (ref 70–99)
Potassium: 3.8 mmol/L (ref 3.5–5.1)
Sodium: 135 mmol/L (ref 135–145)
Total Bilirubin: 0.7 mg/dL (ref 0.3–1.2)
Total Protein: 7.2 g/dL (ref 6.5–8.1)

## 2021-03-24 LAB — CBC
HCT: 40.2 % (ref 36.0–46.0)
Hemoglobin: 13.4 g/dL (ref 12.0–15.0)
MCH: 31.6 pg (ref 26.0–34.0)
MCHC: 33.3 g/dL (ref 30.0–36.0)
MCV: 94.8 fL (ref 80.0–100.0)
Platelets: 334 10*3/uL (ref 150–400)
RBC: 4.24 MIL/uL (ref 3.87–5.11)
RDW: 12.9 % (ref 11.5–15.5)
WBC: 7 10*3/uL (ref 4.0–10.5)
nRBC: 0 % (ref 0.0–0.2)

## 2021-03-24 LAB — BLOOD GAS, ARTERIAL
Acid-Base Excess: 1.8 mmol/L (ref 0.0–2.0)
Bicarbonate: 26 mmol/L (ref 20.0–28.0)
Drawn by: 58793
FIO2: 21
O2 Saturation: 95.6 %
Patient temperature: 37
pCO2 arterial: 41.5 mmHg (ref 32.0–48.0)
pH, Arterial: 7.413 (ref 7.350–7.450)
pO2, Arterial: 78.8 mmHg — ABNORMAL LOW (ref 83.0–108.0)

## 2021-03-24 LAB — TYPE AND SCREEN
ABO/RH(D): O POS
Antibody Screen: NEGATIVE

## 2021-03-24 LAB — SURGICAL PCR SCREEN
MRSA, PCR: NEGATIVE
Staphylococcus aureus: NEGATIVE

## 2021-03-24 LAB — APTT: aPTT: 30 seconds (ref 24–36)

## 2021-03-24 LAB — PROTIME-INR
INR: 0.9 (ref 0.8–1.2)
Prothrombin Time: 12.6 seconds (ref 11.4–15.2)

## 2021-03-24 NOTE — Progress Notes (Signed)
PCP - Dr. Lurline Hare Cardiologist - denies  Chest x-ray - 03/24/21  EKG - 03/24/21 Stress Test - denies ECHO - denies Cardiac Cath - denies  Sleep Study - OSA+ CPAP - uses nightly  Blood Thinner Instructions: n/a Aspirin Instructions: n/a  COVID TEST- 03/24/21; pending. Pt aware to quarantine after testing until DOS.   Anesthesia review: Yes  Patient denies shortness of breath, fever, cough and chest pain at PAT appointment   All instructions explained to the patient, with a verbal understanding of the material. Patient agrees to go over the instructions while at home for a better understanding. Patient also instructed to self quarantine after being tested for COVID-19. The opportunity to ask questions was provided.

## 2021-03-25 LAB — SARS CORONAVIRUS 2 (TAT 6-24 HRS): SARS Coronavirus 2: NEGATIVE

## 2021-03-26 ENCOUNTER — Encounter (HOSPITAL_COMMUNITY): Payer: Self-pay | Admitting: Thoracic Surgery (Cardiothoracic Vascular Surgery)

## 2021-03-26 ENCOUNTER — Other Ambulatory Visit: Payer: Self-pay

## 2021-03-26 ENCOUNTER — Inpatient Hospital Stay (HOSPITAL_COMMUNITY)
Admission: RE | Admit: 2021-03-26 | Discharge: 2021-03-28 | DRG: 167 | Disposition: A | Discharge status: Home / Self Care | Payer: 59 | Attending: Thoracic Surgery (Cardiothoracic Vascular Surgery) | Admitting: Thoracic Surgery (Cardiothoracic Vascular Surgery)

## 2021-03-26 ENCOUNTER — Encounter (HOSPITAL_COMMUNITY)
Admission: RE | Disposition: A | Discharge status: Home / Self Care | Payer: Self-pay | Source: Home / Self Care | Attending: Thoracic Surgery (Cardiothoracic Vascular Surgery)

## 2021-03-26 ENCOUNTER — Inpatient Hospital Stay (HOSPITAL_COMMUNITY): Payer: 59 | Admitting: Certified Registered Nurse Anesthetist

## 2021-03-26 ENCOUNTER — Inpatient Hospital Stay (HOSPITAL_COMMUNITY): Payer: 59

## 2021-03-26 ENCOUNTER — Inpatient Hospital Stay (HOSPITAL_COMMUNITY): Payer: 59 | Admitting: Physician Assistant

## 2021-03-26 DIAGNOSIS — J9383 Other pneumothorax: Secondary | ICD-10-CM | POA: Diagnosis not present

## 2021-03-26 DIAGNOSIS — R911 Solitary pulmonary nodule: Secondary | ICD-10-CM | POA: Diagnosis present

## 2021-03-26 DIAGNOSIS — F411 Generalized anxiety disorder: Secondary | ICD-10-CM | POA: Diagnosis present

## 2021-03-26 DIAGNOSIS — M797 Fibromyalgia: Secondary | ICD-10-CM | POA: Diagnosis present

## 2021-03-26 DIAGNOSIS — G4733 Obstructive sleep apnea (adult) (pediatric): Secondary | ICD-10-CM | POA: Diagnosis present

## 2021-03-26 DIAGNOSIS — I10 Essential (primary) hypertension: Secondary | ICD-10-CM | POA: Diagnosis present

## 2021-03-26 DIAGNOSIS — M3500 Sicca syndrome, unspecified: Secondary | ICD-10-CM | POA: Diagnosis present

## 2021-03-26 DIAGNOSIS — C7A090 Malignant carcinoid tumor of the bronchus and lung: Principal | ICD-10-CM | POA: Diagnosis present

## 2021-03-26 DIAGNOSIS — C3431 Malignant neoplasm of lower lobe, right bronchus or lung: Secondary | ICD-10-CM

## 2021-03-26 DIAGNOSIS — Z8249 Family history of ischemic heart disease and other diseases of the circulatory system: Secondary | ICD-10-CM

## 2021-03-26 DIAGNOSIS — M329 Systemic lupus erythematosus, unspecified: Secondary | ICD-10-CM | POA: Diagnosis present

## 2021-03-26 DIAGNOSIS — Z87891 Personal history of nicotine dependence: Secondary | ICD-10-CM

## 2021-03-26 DIAGNOSIS — J939 Pneumothorax, unspecified: Secondary | ICD-10-CM

## 2021-03-26 DIAGNOSIS — Z880 Allergy status to penicillin: Secondary | ICD-10-CM

## 2021-03-26 DIAGNOSIS — Z801 Family history of malignant neoplasm of trachea, bronchus and lung: Secondary | ICD-10-CM

## 2021-03-26 DIAGNOSIS — Z832 Family history of diseases of the blood and blood-forming organs and certain disorders involving the immune mechanism: Secondary | ICD-10-CM

## 2021-03-26 DIAGNOSIS — Z20822 Contact with and (suspected) exposure to covid-19: Secondary | ICD-10-CM | POA: Diagnosis present

## 2021-03-26 HISTORY — PX: LYMPH NODE DISSECTION: SHX5087

## 2021-03-26 HISTORY — PX: INTERCOSTAL NERVE BLOCK: SHX5021

## 2021-03-26 LAB — ABO/RH: ABO/RH(D): O POS

## 2021-03-26 SURGERY — WEDGE RESECTION, LUNG, ROBOT-ASSISTED, THORACOSCOPIC
Anesthesia: General | Site: Chest | Laterality: Right

## 2021-03-26 MED ORDER — LIDOCAINE 2% (20 MG/ML) 5 ML SYRINGE
INTRAMUSCULAR | Status: DC | PRN
  Administered 2021-03-26: 60 mg via INTRAVENOUS

## 2021-03-26 MED ORDER — FENTANYL CITRATE (PF) 250 MCG/5ML IJ SOLN
INTRAMUSCULAR | Status: AC
  Filled 2021-03-26: qty 5

## 2021-03-26 MED ORDER — ACETAMINOPHEN 500 MG PO TABS
1000.0000 mg | ORAL_TABLET | Freq: Four times a day (QID) | ORAL | Status: DC
  Administered 2021-03-26 – 2021-03-28 (×6): 1000 mg via ORAL
  Filled 2021-03-26 (×7): qty 2

## 2021-03-26 MED ORDER — SENNOSIDES-DOCUSATE SODIUM 8.6-50 MG PO TABS
1.0000 | ORAL_TABLET | Freq: Every day | ORAL | Status: DC
  Administered 2021-03-27: 1 via ORAL
  Filled 2021-03-26: qty 1

## 2021-03-26 MED ORDER — VANCOMYCIN HCL 1000 MG/200ML IV SOLN
1000.0000 mg | Freq: Two times a day (BID) | INTRAVENOUS | Status: AC
  Administered 2021-03-26: 1000 mg via INTRAVENOUS
  Filled 2021-03-26: qty 200

## 2021-03-26 MED ORDER — SODIUM CHLORIDE (PF) 0.9 % IJ SOLN
INTRAMUSCULAR | Status: AC
  Filled 2021-03-26: qty 10

## 2021-03-26 MED ORDER — BUPIVACAINE LIPOSOME 1.3 % IJ SUSP
INTRAMUSCULAR | Status: DC | PRN
  Administered 2021-03-26: 100 mL

## 2021-03-26 MED ORDER — BISACODYL 5 MG PO TBEC
10.0000 mg | DELAYED_RELEASE_TABLET | Freq: Every day | ORAL | Status: DC
  Filled 2021-03-26: qty 2

## 2021-03-26 MED ORDER — PROPOFOL 10 MG/ML IV BOLUS
INTRAVENOUS | Status: AC
  Filled 2021-03-26: qty 40

## 2021-03-26 MED ORDER — ENOXAPARIN SODIUM 30 MG/0.3ML IJ SOSY
30.0000 mg | PREFILLED_SYRINGE | INTRAMUSCULAR | Status: DC
  Administered 2021-03-27: 30 mg via SUBCUTANEOUS
  Filled 2021-03-26: qty 0.3

## 2021-03-26 MED ORDER — TRAMADOL HCL 50 MG PO TABS
50.0000 mg | ORAL_TABLET | Freq: Four times a day (QID) | ORAL | Status: DC | PRN
  Administered 2021-03-26: 100 mg via ORAL
  Administered 2021-03-27 (×2): 50 mg via ORAL
  Administered 2021-03-28: 100 mg via ORAL
  Filled 2021-03-26: qty 2
  Filled 2021-03-26: qty 1
  Filled 2021-03-26: qty 2
  Filled 2021-03-26: qty 1

## 2021-03-26 MED ORDER — KETAMINE HCL 50 MG/5ML IJ SOSY
PREFILLED_SYRINGE | INTRAMUSCULAR | Status: AC
  Filled 2021-03-26: qty 5

## 2021-03-26 MED ORDER — BUPIVACAINE LIPOSOME 1.3 % IJ SUSP
INTRAMUSCULAR | Status: AC
  Filled 2021-03-26: qty 20

## 2021-03-26 MED ORDER — ONDANSETRON HCL 4 MG/2ML IJ SOLN
INTRAMUSCULAR | Status: DC | PRN
  Administered 2021-03-26: 4 mg via INTRAVENOUS

## 2021-03-26 MED ORDER — ONDANSETRON HCL 4 MG/2ML IJ SOLN: 4.0000 mg | Freq: Four times a day (QID) | INTRAMUSCULAR | Status: DC | PRN

## 2021-03-26 MED ORDER — PROPOFOL 10 MG/ML IV BOLUS
INTRAVENOUS | Status: DC | PRN
  Administered 2021-03-26: 170 mg via INTRAVENOUS
  Administered 2021-03-26: 20 mg via INTRAVENOUS

## 2021-03-26 MED ORDER — MIDAZOLAM HCL 5 MG/5ML IJ SOLN
INTRAMUSCULAR | Status: DC | PRN
  Administered 2021-03-26: 2 mg via INTRAVENOUS

## 2021-03-26 MED ORDER — ONDANSETRON HCL 4 MG/2ML IJ SOLN
INTRAMUSCULAR | Status: AC
  Filled 2021-03-26: qty 2

## 2021-03-26 MED ORDER — ACETAMINOPHEN 160 MG/5ML PO SOLN: 1000.0000 mg | Freq: Four times a day (QID) | ORAL | Status: DC

## 2021-03-26 MED ORDER — VANCOMYCIN HCL IN DEXTROSE 1-5 GM/200ML-% IV SOLN
INTRAVENOUS | Status: AC
  Administered 2021-03-26: 1000 mg via INTRAVENOUS
  Filled 2021-03-26: qty 200

## 2021-03-26 MED ORDER — DEXAMETHASONE SODIUM PHOSPHATE 10 MG/ML IJ SOLN
INTRAMUSCULAR | Status: DC | PRN
  Administered 2021-03-26: 10 mg via INTRAVENOUS

## 2021-03-26 MED ORDER — GABAPENTIN 300 MG PO CAPS: 300.0000 mg | ORAL_CAPSULE | Freq: Once | ORAL | Status: DC

## 2021-03-26 MED ORDER — MIDAZOLAM HCL 2 MG/2ML IJ SOLN
INTRAMUSCULAR | Status: AC
  Filled 2021-03-26: qty 2

## 2021-03-26 MED ORDER — MORPHINE SULFATE (PF) 2 MG/ML IV SOLN
1.0000 mg | INTRAVENOUS | Status: DC | PRN
  Administered 2021-03-26 – 2021-03-27 (×5): 2 mg via INTRAVENOUS
  Filled 2021-03-26 (×5): qty 1

## 2021-03-26 MED ORDER — ROCURONIUM BROMIDE 10 MG/ML (PF) SYRINGE
PREFILLED_SYRINGE | INTRAVENOUS | Status: DC | PRN
  Administered 2021-03-26: 40 mg via INTRAVENOUS
  Administered 2021-03-26: 60 mg via INTRAVENOUS

## 2021-03-26 MED ORDER — KETAMINE HCL 10 MG/ML IJ SOLN
INTRAMUSCULAR | Status: DC | PRN
  Administered 2021-03-26: 20 mg via INTRAVENOUS

## 2021-03-26 MED ORDER — ROSUVASTATIN CALCIUM 5 MG PO TABS
10.0000 mg | ORAL_TABLET | Freq: Every day | ORAL | Status: DC
  Administered 2021-03-26 – 2021-03-27 (×2): 10 mg via ORAL
  Filled 2021-03-26 (×2): qty 2

## 2021-03-26 MED ORDER — LIDOCAINE 2% (20 MG/ML) 5 ML SYRINGE
INTRAMUSCULAR | Status: AC
  Filled 2021-03-26: qty 5

## 2021-03-26 MED ORDER — ACETAMINOPHEN 500 MG PO TABS: 1000.0000 mg | ORAL_TABLET | Freq: Once | ORAL | Status: DC

## 2021-03-26 MED ORDER — ROCURONIUM BROMIDE 10 MG/ML (PF) SYRINGE
PREFILLED_SYRINGE | INTRAVENOUS | Status: AC
  Filled 2021-03-26: qty 10

## 2021-03-26 MED ORDER — FENTANYL CITRATE (PF) 250 MCG/5ML IJ SOLN
INTRAMUSCULAR | Status: DC | PRN
  Administered 2021-03-26 (×3): 50 ug via INTRAVENOUS
  Administered 2021-03-26: 150 ug via INTRAVENOUS

## 2021-03-26 MED ORDER — DEXAMETHASONE SODIUM PHOSPHATE 10 MG/ML IJ SOLN
INTRAMUSCULAR | Status: AC
  Filled 2021-03-26: qty 1

## 2021-03-26 MED ORDER — ACETAMINOPHEN 10 MG/ML IV SOLN
INTRAVENOUS | Status: AC
  Administered 2021-03-26: 1000 mg
  Filled 2021-03-26: qty 100

## 2021-03-26 MED ORDER — LACTATED RINGERS IV SOLN: INTRAVENOUS | Status: DC | PRN

## 2021-03-26 MED ORDER — PHENYLEPHRINE 40 MCG/ML (10ML) SYRINGE FOR IV PUSH (FOR BLOOD PRESSURE SUPPORT)
PREFILLED_SYRINGE | INTRAVENOUS | Status: AC
  Filled 2021-03-26: qty 10

## 2021-03-26 MED ORDER — PROMETHAZINE HCL 25 MG/ML IJ SOLN: 6.2500 mg | INTRAMUSCULAR | Status: DC | PRN

## 2021-03-26 MED ORDER — 0.9 % SODIUM CHLORIDE (POUR BTL) OPTIME
TOPICAL | Status: DC | PRN
  Administered 2021-03-26: 2000 mL

## 2021-03-26 MED ORDER — SUGAMMADEX SODIUM 200 MG/2ML IV SOLN
INTRAVENOUS | Status: DC | PRN
  Administered 2021-03-26: 200 mg via INTRAVENOUS

## 2021-03-26 MED ORDER — ORAL CARE MOUTH RINSE: 15.0000 mL | Freq: Once | OROMUCOSAL | Status: AC

## 2021-03-26 MED ORDER — CHLORHEXIDINE GLUCONATE 0.12 % MT SOLN
OROMUCOSAL | Status: AC
  Administered 2021-03-26: 15 mL via OROMUCOSAL
  Filled 2021-03-26: qty 15

## 2021-03-26 MED ORDER — VANCOMYCIN HCL IN DEXTROSE 1-5 GM/200ML-% IV SOLN
1000.0000 mg | INTRAVENOUS | Status: AC
Start: 2021-03-26 — End: 2021-03-27

## 2021-03-26 MED ORDER — CHLORHEXIDINE GLUCONATE 0.12 % MT SOLN: 15.0000 mL | Freq: Once | OROMUCOSAL | Status: AC

## 2021-03-26 MED ORDER — KETOROLAC TROMETHAMINE 30 MG/ML IJ SOLN
INTRAMUSCULAR | Status: AC
  Filled 2021-03-26: qty 1

## 2021-03-26 MED ORDER — PHENYLEPHRINE 40 MCG/ML (10ML) SYRINGE FOR IV PUSH (FOR BLOOD PRESSURE SUPPORT)
PREFILLED_SYRINGE | INTRAVENOUS | Status: DC | PRN
  Administered 2021-03-26: 80 ug via INTRAVENOUS
  Administered 2021-03-26: 160 ug via INTRAVENOUS
  Administered 2021-03-26: 40 ug via INTRAVENOUS
  Administered 2021-03-26: 80 ug via INTRAVENOUS

## 2021-03-26 MED ORDER — FENTANYL CITRATE (PF) 100 MCG/2ML IJ SOLN
25.0000 ug | INTRAMUSCULAR | Status: DC | PRN
  Administered 2021-03-26: 25 ug via INTRAVENOUS
  Administered 2021-03-26: 50 ug via INTRAVENOUS

## 2021-03-26 MED ORDER — KETOROLAC TROMETHAMINE 30 MG/ML IJ SOLN
30.0000 mg | Freq: Four times a day (QID) | INTRAMUSCULAR | Status: DC
  Administered 2021-03-26 – 2021-03-28 (×8): 30 mg via INTRAVENOUS
  Filled 2021-03-26 (×7): qty 1

## 2021-03-26 MED ORDER — LACTATED RINGERS IV SOLN: INTRAVENOUS | Status: DC

## 2021-03-26 MED ORDER — DULOXETINE HCL 30 MG PO CPEP
30.0000 mg | ORAL_CAPSULE | Freq: Every day | ORAL | Status: DC
  Administered 2021-03-27 – 2021-03-28 (×2): 30 mg via ORAL
  Filled 2021-03-26 (×2): qty 1

## 2021-03-26 MED ORDER — BUPIVACAINE HCL (PF) 0.5 % IJ SOLN
INTRAMUSCULAR | Status: AC
  Filled 2021-03-26: qty 30

## 2021-03-26 MED ORDER — FENTANYL CITRATE (PF) 100 MCG/2ML IJ SOLN
INTRAMUSCULAR | Status: AC
  Administered 2021-03-26: 25 ug via INTRAVENOUS
  Filled 2021-03-26: qty 2

## 2021-03-26 SURGICAL SUPPLY — 102 items
BLADE 11 SAFETY STRL DISP (BLADE) ×4 IMPLANT
BLADE CLIPPER SURG (BLADE) ×4 IMPLANT
BNDG COHESIVE 6X5 TAN STRL LF (GAUZE/BANDAGES/DRESSINGS) ×4 IMPLANT
CANISTER SUCT 3000ML PPV (MISCELLANEOUS) ×8 IMPLANT
CANNULA REDUC XI 12-8 STAPL (CANNULA) ×2
CANNULA REDUCER 12-8 DVNC XI (CANNULA) ×6 IMPLANT
CATH THORACIC 28FR (CATHETERS) ×4 IMPLANT
CATH THORACIC 28FR RT ANG (CATHETERS) IMPLANT
CATH THORACIC 36FR (CATHETERS) IMPLANT
CATH THORACIC 36FR RT ANG (CATHETERS) IMPLANT
CATH TROCAR 20FR (CATHETERS) IMPLANT
CHLORAPREP W/TINT 26 (MISCELLANEOUS) ×4 IMPLANT
CLIP VESOCCLUDE MED 6/CT (CLIP) IMPLANT
CNTNR URN SCR LID CUP LEK RST (MISCELLANEOUS) ×24 IMPLANT
CONN ST 1/4X3/8  BEN (MISCELLANEOUS)
CONN ST 1/4X3/8 BEN (MISCELLANEOUS) IMPLANT
CONT SPEC 4OZ STRL OR WHT (MISCELLANEOUS) ×8
COVER SURGICAL LIGHT HANDLE (MISCELLANEOUS) IMPLANT
DEFOGGER SCOPE WARMER CLEARIFY (MISCELLANEOUS) ×4 IMPLANT
DERMABOND ADVANCED (GAUZE/BANDAGES/DRESSINGS) ×1
DERMABOND ADVANCED .7 DNX12 (GAUZE/BANDAGES/DRESSINGS) ×3 IMPLANT
DRAIN CHANNEL 28F RND 3/8 FF (WOUND CARE) IMPLANT
DRAIN CHANNEL 32F RND 10.7 FF (WOUND CARE) IMPLANT
DRAPE ARM DVNC X/XI (DISPOSABLE) ×12 IMPLANT
DRAPE COLUMN DVNC XI (DISPOSABLE) ×3 IMPLANT
DRAPE CV SPLIT W-CLR ANES SCRN (DRAPES) ×4 IMPLANT
DRAPE DA VINCI XI ARM (DISPOSABLE) ×4
DRAPE DA VINCI XI COLUMN (DISPOSABLE) ×1
DRAPE ORTHO SPLIT 77X108 STRL (DRAPES) ×1
DRAPE SURG ORHT 6 SPLT 77X108 (DRAPES) ×3 IMPLANT
ELECT BLADE 6.5 EXT (BLADE) IMPLANT
ELECT REM PT RETURN 9FT ADLT (ELECTROSURGICAL) ×4
ELECTRODE REM PT RTRN 9FT ADLT (ELECTROSURGICAL) ×3 IMPLANT
GAUZE KITTNER 4X5 RF (MISCELLANEOUS) ×4 IMPLANT
GAUZE SPONGE 4X4 12PLY STRL (GAUZE/BANDAGES/DRESSINGS) ×4 IMPLANT
GLOVE BIO SURGEON STRL SZ7.5 (GLOVE) ×8 IMPLANT
GOWN STRL REUS W/ TWL LRG LVL3 (GOWN DISPOSABLE) ×6 IMPLANT
GOWN STRL REUS W/ TWL XL LVL3 (GOWN DISPOSABLE) ×9 IMPLANT
GOWN STRL REUS W/TWL 2XL LVL3 (GOWN DISPOSABLE) ×4 IMPLANT
GOWN STRL REUS W/TWL LRG LVL3 (GOWN DISPOSABLE) ×2
GOWN STRL REUS W/TWL XL LVL3 (GOWN DISPOSABLE) ×3
HEMOSTAT SURGICEL 2X14 (HEMOSTASIS) ×4 IMPLANT
KIT BASIN OR (CUSTOM PROCEDURE TRAY) ×4 IMPLANT
KIT SUCTION CATH 14FR (SUCTIONS) IMPLANT
KIT TURNOVER KIT B (KITS) ×4 IMPLANT
LOOP VESSEL SUPERMAXI WHITE (MISCELLANEOUS) IMPLANT
NEEDLE 22X1 1/2 (OR ONLY) (NEEDLE) ×4 IMPLANT
NEEDLE HYPO 25GX1X1/2 BEV (NEEDLE) ×4 IMPLANT
NS IRRIG 1000ML POUR BTL (IV SOLUTION) ×12 IMPLANT
OBTURATOR OPTICAL STANDARD 8MM (TROCAR)
OBTURATOR OPTICAL STND 8 DVNC (TROCAR)
OBTURATOR OPTICALSTD 8 DVNC (TROCAR) IMPLANT
PACK CHEST (CUSTOM PROCEDURE TRAY) ×4 IMPLANT
PAD ARMBOARD 7.5X6 YLW CONV (MISCELLANEOUS) ×20 IMPLANT
PORT ACCESS TROCAR AIRSEAL 12 (TROCAR) ×3 IMPLANT
PORT ACCESS TROCAR AIRSEAL 5M (TROCAR) ×1
RELOAD STAPLER 3.5X45 BLU DVNC (STAPLE) ×15 IMPLANT
SEAL CANN UNIV 5-8 DVNC XI (MISCELLANEOUS) ×6 IMPLANT
SEAL XI 5MM-8MM UNIVERSAL (MISCELLANEOUS) ×2
SEALANT PROGEL (MISCELLANEOUS) IMPLANT
SEALANT SURG COSEAL 4ML (VASCULAR PRODUCTS) IMPLANT
SEALANT SURG COSEAL 8ML (VASCULAR PRODUCTS) IMPLANT
SET TRI-LUMEN FLTR TB AIRSEAL (TUBING) ×4 IMPLANT
SOLUTION ELECTROLUBE (MISCELLANEOUS) IMPLANT
SPONGE INTESTINAL PEANUT (DISPOSABLE) IMPLANT
STAPLER 45 SUREFORM CVD (STAPLE) ×1
STAPLER 45 SUREFORM CVD DVNC (STAPLE) ×3 IMPLANT
STAPLER CANNULA SEAL DVNC XI (STAPLE) ×6 IMPLANT
STAPLER CANNULA SEAL XI (STAPLE) ×2
STAPLER RELOAD 3.5X45 BLU DVNC (STAPLE) ×15
STAPLER RELOAD 3.5X45 BLUE (STAPLE) ×5
STOPCOCK 4 WAY LG BORE MALE ST (IV SETS) ×4 IMPLANT
SUT MON AB 2-0 CT1 36 (SUTURE) IMPLANT
SUT PDS AB 1 CTX 36 (SUTURE) IMPLANT
SUT PROLENE 4 0 RB 1 (SUTURE)
SUT PROLENE 4-0 RB1 .5 CRCL 36 (SUTURE) IMPLANT
SUT SILK  1 MH (SUTURE) ×1
SUT SILK 1 MH (SUTURE) ×3 IMPLANT
SUT SILK 1 TIES 10X30 (SUTURE) IMPLANT
SUT SILK 2 0 SH (SUTURE) ×4 IMPLANT
SUT SILK 2 0SH CR/8 30 (SUTURE) IMPLANT
SUT VIC AB 1 CTX 36 (SUTURE)
SUT VIC AB 1 CTX36XBRD ANBCTR (SUTURE) IMPLANT
SUT VIC AB 2-0 CT1 27 (SUTURE) ×1
SUT VIC AB 2-0 CT1 TAPERPNT 27 (SUTURE) ×3 IMPLANT
SUT VIC AB 3-0 SH 27 (SUTURE) ×2
SUT VIC AB 3-0 SH 27X BRD (SUTURE) ×6 IMPLANT
SUT VICRYL 0 TIES 12 18 (SUTURE) ×4 IMPLANT
SUT VICRYL 0 UR6 27IN ABS (SUTURE) ×8 IMPLANT
SUT VICRYL 2 TP 1 (SUTURE) IMPLANT
SYR 10ML LL (SYRINGE) ×4 IMPLANT
SYR 20ML LL LF (SYRINGE) ×4 IMPLANT
SYR 50ML LL SCALE MARK (SYRINGE) ×4 IMPLANT
SYSTEM RETRIEVAL ANCHOR 8 (MISCELLANEOUS) ×4 IMPLANT
SYSTEM SAHARA CHEST DRAIN ATS (WOUND CARE) ×4 IMPLANT
TAPE CLOTH 4X10 WHT NS (GAUZE/BANDAGES/DRESSINGS) ×4 IMPLANT
TIP APPLICATOR SPRAY EXTEND 16 (VASCULAR PRODUCTS) IMPLANT
TOWEL GREEN STERILE (TOWEL DISPOSABLE) ×4 IMPLANT
TRAY FOLEY MTR SLVR 16FR STAT (SET/KITS/TRAYS/PACK) ×4 IMPLANT
TROCAR BLADELESS 15MM (ENDOMECHANICALS) IMPLANT
TUBING EXTENTION W/L.L. (IV SETS) ×4 IMPLANT
WATER STERILE IRR 1000ML POUR (IV SOLUTION) ×4 IMPLANT

## 2021-03-26 NOTE — Discharge Summary (Signed)
Physician Discharge Summary       [address]       ,[place] [address]             [phone]    Patient ID: Patricia Mathews MRN: [id number] DOB/AGE: September 25, 1961 60 y.o.  Admit date: 03/26/2021 Discharge date: 03/28/2021  Admission Diagnoses: Right lower lobe pulmonary nodule  Discharge Diagnoses:  1. Carcinoid RLL 2. S/p XI robotic assisted right VATS, wedge resection, LN dissection, and intercostal nerve block History of the following: . Endometrial polyp   . Fibromyalgia   . GAD (generalized anxiety disorder)   . History of panic attacks   . Hypertension    followed by pcp  . Neuropathy   . OSA on CPAP    followed by dr dohmeier--- per study 02-02-2018 moderate to severe osa  . Sjogren's disease ([place])   . Systemic lupus ([place])    rheumonotologist-- dr a. Trudie Reed    Consults: None  Procedure (s):  XI ROBOTIC ASSISTED RIGHT THORACOSCOPY,RIGHT LOWER LOBE WEDGE RESECTION, LYMPH NODE DISSECTION, and RIGHT INTERCOSTAL NERVE BLOCK by Dr. Kipp Brood on 03/26/2021.  Pathology: pending  History of Presenting Illness: Patricia Mathews 60 y.o. female referred by Dr. Valeta Harms for surgical evaluation of a 9 mm right lower lobe pulmonary nodule.  This was originally found on a coronary CT and has been followed for several years.  She recently underwent a PET/CT which showed minimal uptake.  There is also not been much change in size since 2020.  She quit tobacco use in 1995, and previously only smoked about half a pack a day.  She does have a family history of primary lung cancer in non-smokers that she is very concerned about this.   She denies any pleuritic or respiratory symptoms.  Her weight has been stable.  She denies any neurologic symptoms.   Dr. Shelbie Ammons discussed the need for proceeding directly with a surgical wedge resection. Furthermore, Dr. Kipp Brood explained to her that if this does prove to be a primary lung cancer then we will proceed  with a lobectomy at the same time. It is quite peripheral thus Dr. Shelbie Ammons thought we will not need to attempt any navigational bronchoscopy for marking or biopsy. Patient presented to [facility] on 05/18 in order to undergo a XI robotic assisted right thoracoscopy, wedge RLL, LN dissection, and intercostal nerve block.  Brief Hospital Course:  Patient was extubated without difficulty. A line and foley were removed early in her post operative course. Chest tube was placed to water seal after surgery and there was no air leak. Chest x ray showed right apical pneumothorax.  Chest tube was left on water seal and clamped for 2 hours.  Repeat CXR was obtained and showed stable appearance of apical pneumothorax. Chest tube was removed on 05/19. She was hypertensive and restarted on her home anti-hypertensive agents.  She required IV pain medication for pain management.  This was discontinued and patient was placed on oral regimen to achieve adequate pain control prior to discharge.  Patient is tolerating a diet. She is ambulating on room air with good oxygenation. All wounds are clean, dry, and healing without sign of infection. Patient is felt surgically stable for discharge today.  Latest Vital Signs: Blood pressure 110/67, pulse 65, temperature 98.9 F (37.2 C), temperature source Oral, resp. rate 13, height 5\' 5"  (1.651 m), weight 99.3 kg, SpO2 97 %.  Physical Exam: General appearance: alert, cooperative and no distress Heart: regular rate  and rhythm Lungs: clear to auscultation bilaterally Abdomen: soft, non-tender; bowel sounds normal; no masses,  no organomegaly Extremities: extremities normal, atraumatic, no cyanosis or edema Wound: clean and dry   Discharge Condition: good  Recent laboratory studies:  Lab Results  Component Value Date   WBC 8.3 03/28/2021   HGB 11.9 (L) 03/28/2021   HCT 37.1 03/28/2021   MCV 98.4 03/28/2021   PLT 301 03/28/2021   Lab Results  Component Value Date    NA 136 03/28/2021   K 4.0 03/28/2021   CL 99 03/28/2021   CO2 30 03/28/2021   CREATININE 1.06 (H) 03/28/2021   GLUCOSE 126 (H) 03/28/2021    Diagnostic Studies: DG Chest 2 View  Result Date: 03/25/2021 CLINICAL DATA:  60 year old female with a history of upcoming surgery. History of right lower lobe lung nodule EXAM: CHEST - 2 VIEW COMPARISON:  Chest CT 01/17/2021, PET CT 01/31/2021 FINDINGS: Cardiomediastinal silhouette within normal limits in size and contour. No evidence of central vascular congestion. No pneumothorax or pleural effusion. No confluent airspace disease. Known right lower lobe lung nodule better seen on prior CT and PET CT. No acute displaced fracture. IMPRESSION: Negative for acute cardiopulmonary disease. Electronically Signed   By: Corrie Mckusick D.O.   On: 03/25/2021 08:32   DG Chest Port 1 View  Result Date: 03/27/2021 CLINICAL DATA:  Chest tube. EXAM: PORTABLE CHEST 1 VIEW COMPARISON:  03/27/2021. FINDINGS: Right chest tube in stable position. Tiny right apical pneumothorax again noted. Low lung volumes with mild bibasilar atelectasis. Stable cardiomegaly. No acute bony abnormality. IMPRESSION: 1. Right chest tube in stable position. Stable tiny right apical pneumothorax. 2.  Low lung volumes with mild bibasilar atelectasis. 3.  Stable cardiomegaly. Electronically Signed   By: Marcello Moores  Register   On: 03/27/2021 16:03   DG CHEST PORT 1 VIEW  Result Date: 03/27/2021 CLINICAL DATA:  Chest tube placement EXAM: PORTABLE CHEST 1 VIEW COMPARISON:  03/26/2021 FINDINGS: Large bore right chest tube is again seen. Small right apical pneumothorax has developed since prior examination. Minimal left basilar atelectasis or infiltrate. No pneumothorax on the left. No pleural effusion. Cardiac size is within normal limits. No acute bone abnormality. IMPRESSION: Interval development of small right apical pneumothorax. Large bore right chest tube in place. Clinical correlation for appropriate  chest tube function is recommended. These results will be called to the ordering clinician or representative by the Radiologist Assistant, and communication documented in the PACS or [employer]. Electronically Signed   By: Fidela Salisbury MD   On: 03/27/2021 07:00   DG Chest Port 1 View  Result Date: 03/26/2021 CLINICAL DATA:  Pneumothorax status post right lower lobe wedge resection EXAM: PORTABLE CHEST 1 VIEW COMPARISON:  Mar 24, 2021. FINDINGS: The heart size and mediastinal contours are within normal limits. Right-sided chest tube is noted without pneumothorax. Lungs are clear. The visualized skeletal structures are unremarkable. IMPRESSION: Right-sided chest tube without pneumothorax. Electronically Signed   By: Marijo Conception M.D.   On: 03/26/2021 14:48     Discharge Medications: Allergies as of 03/28/2021      Reactions   Penicillins Anaphylaxis      Medication List    TAKE these medications   acetaminophen 500 MG tablet Commonly known as: TYLENOL Take 1,000 mg by mouth every 6 (six) hours as needed for moderate pain or headache.   BLACK ELDERBERRY PO Take 1,000 mg by mouth daily.   DULoxetine 30 MG capsule Commonly known as: CYMBALTA TAKE  1 CAPSULE (30 MG TOTAL) BY MOUTH 2 (TWO) TIMES DAILY. What changed:   how much to take  how to take this  when to take this  additional instructions   losartan-hydrochlorothiazide 50-12.5 MG tablet Commonly known as: HYZAAR TAKE 1 TABLET BY MOUTH DAILY.   rosuvastatin 10 MG tablet Commonly known as: CRESTOR TAKE 1 TABLET (10 MG TOTAL) BY MOUTH DAILY. What changed: how much to take   traMADol 50 MG tablet Commonly known as: ULTRAM Take 1-2 tablets (50-100 mg total) by mouth every 6 (six) hours as needed (mild pain).   TURMERIC PO Take 1 tablet by mouth daily.   vitamin B-12 1000 MCG tablet Commonly known as: CYANOCOBALAMIN Take 1,000 mcg by mouth daily.   VITAMIN D PO Take 1 capsule by mouth daily.        Follow Up Appointments:  Follow-up Information    Lajuana Matte, MD Follow up on 04/03/2021.   Specialty: Cardiothoracic Surgery Why: Appointment is at 3:00 Contact information: [place] [address] [phone]               Signed: Ellamae Sia 03/28/2021, 10:32 AM

## 2021-03-26 NOTE — Interval H&P Note (Signed)
History and Physical Interval Note:  03/26/2021 8:26 AM  Patricia Mathews  has presented today for surgery, with the diagnosis of PULMONARY NODULE.  The various methods of treatment have been discussed with the patient and family. After consideration of risks, benefits and other options for treatment, the patient has consented to  Procedure(s): XI ROBOTIC ASSISTED THORASCOPY-RIGHT LOWER LOBE WEDGE RESECTION, possible lobectomy (Right) as a surgical intervention.  The patient's history has been reviewed, patient examined, no change in status, stable for surgery.  I have reviewed the patient's chart and labs.  Questions were answered to the patient's satisfaction.     Hill Mackie Bary Leriche

## 2021-03-26 NOTE — Brief Op Note (Addendum)
03/26/2021  11:29 AM  PATIENT:  Patricia Mathews  60 y.o. female  PRE-OPERATIVE DIAGNOSIS:  RIGHT LOWER LOBE PULMONARY NODULE  POST-OPERATIVE DIAGNOSIS:  RIGHT LOWER LOBE PULMONARY NODULE, probably CARCINOID  PROCEDURE:XI ROBOTIC ASSISTED RIGHT THORACOSCOPY,RIGHT LOWER LOBE WEDGE RESECTION, LYMPH NODE DISSECTION, and RIGHT INTERCOSTAL NERVE BLOCK   SURGEON:  Surgeon(s) and Role:    Lightfoot, Lucile Crater, MD - Primary  PHYSICIAN ASSISTANT: Lars Pinks PA-C  ANESTHESIA:   general  EBL:  25 mL   BLOOD ADMINISTERED:none  DRAINS: 68 French chest tube placed in the right pleural space   LOCAL MEDICATIONS USED:  OTHER Exparel  SPECIMEN:  Source of Specimen:  Wedge RLL, mulitple lymph nodes  DISPOSITION OF SPECIMEN:  Pathology. Frozen wedge RLL showed carcinoid and hilar lymph nodes negative for cancer.  COUNTS CORRECT:  YES  DICTATION: .Dragon Dictation  PLAN OF CARE: Admit to inpatient   PATIENT DISPOSITION:  PACU - hemodynamically stable.   Delay start of Pharmacological VTE agent (>24hrs) due to surgical blood loss or risk of bleeding: no

## 2021-03-26 NOTE — Transfer of Care (Signed)
Immediate Anesthesia Transfer of Care Note  Patient: Patricia Mathews  Procedure(s) Performed: XI ROBOTIC ASSISTED THORASCOPY-RIGHT LOWER LOBE WEDGE RESECTION (Right Chest) INTERCOSTAL NERVE BLOCK (Right ) LYMPH NODE DISSECTION  Patient Location: PACU  Anesthesia Type:General  Level of Consciousness: drowsy and patient cooperative  Airway & Oxygen Therapy: Patient Spontanous Breathing and Patient connected to face mask oxygen  Post-op Assessment: Report given to RN, Post -op Vital signs reviewed and stable and Patient moving all extremities X 4  Post vital signs: Reviewed and stable  Last Vitals:  Vitals Value Taken Time  BP 153/92 03/26/21 1143  Temp    Pulse 85 03/26/21 1146  Resp 17 03/26/21 1146  SpO2 100 % 03/26/21 1146  Vitals shown include unvalidated device data.  Last Pain:  Vitals:   03/26/21 0700  TempSrc: Oral         Complications: No complications documented.

## 2021-03-26 NOTE — Anesthesia Preprocedure Evaluation (Signed)
Anesthesia Evaluation  Patient identified by MRN, date of birth, ID band Patient awake    Reviewed: Allergy & Precautions, NPO status , Patient's Chart, lab work & pertinent test results  Airway Mallampati: II  TM Distance: >3 FB Neck ROM: Full    Dental  (+) Dental Advisory Given   Pulmonary sleep apnea and Continuous Positive Airway Pressure Ventilation , former smoker,    breath sounds clear to auscultation       Cardiovascular hypertension, Pt. on medications  Rhythm:Regular Rate:Normal     Neuro/Psych  Headaches, Anxiety  Neuromuscular disease    GI/Hepatic negative GI ROS, Neg liver ROS,   Endo/Other  negative endocrine ROS  Renal/GU negative Renal ROS     Musculoskeletal   Abdominal   Peds  Hematology negative hematology ROS (+)   Anesthesia Other Findings   Reproductive/Obstetrics                             Anesthesia Physical Anesthesia Plan  ASA: II  Anesthesia Plan: General   Post-op Pain Management:    Induction: Intravenous  PONV Risk Score and Plan: 3 and Midazolam, Dexamethasone, Ondansetron and Treatment may vary due to age or medical condition  Airway Management Planned: Double Lumen EBT  Additional Equipment:   Intra-op Plan:   Post-operative Plan: Extubation in OR and Possible Post-op intubation/ventilation  Informed Consent: I have reviewed the patients History and Physical, chart, labs and discussed the procedure including the risks, benefits and alternatives for the proposed anesthesia with the patient or authorized representative who has indicated his/her understanding and acceptance.     Dental advisory given  Plan Discussed with: CRNA  Anesthesia Plan Comments: (2 IV's, clearsight. GETA with 37 EBT.)        Anesthesia Quick Evaluation

## 2021-03-26 NOTE — Anesthesia Procedure Notes (Addendum)
Procedure Name: Intubation Date/Time: 03/26/2021 8:51 AM Performed by: Harden Mo, CRNA Pre-anesthesia Checklist: Patient identified, Emergency Drugs available, Suction available and Patient being monitored Patient Re-evaluated:Patient Re-evaluated prior to induction Oxygen Delivery Method: Circle System Utilized Preoxygenation: Pre-oxygenation with 100% oxygen Induction Type: IV induction Ventilation: Mask ventilation without difficulty and Oral airway inserted - appropriate to patient size Laryngoscope Size: Mac and 3 Grade View: Grade I Endobronchial tube: Left, Double lumen EBT and EBT position confirmed by auscultation and 37 Fr Number of attempts: 1 Airway Equipment and Method: Stylet and Oral airway (VivaSight video MLT) Placement Confirmation: ETT inserted through vocal cords under direct vision,  positive ETCO2 and breath sounds checked- equal and bilateral Secured at: 27 cm Tube secured with: Tape Dental Injury: Teeth and Oropharynx as per pre-operative assessment  Comments: Placed by C. [place], [place]

## 2021-03-26 NOTE — Op Note (Signed)
      [address]       [place] [address]             [phone]        03/26/2021  Patient:  Ronita Hipps Pre-Op Dx: Right lower lobe pulmonary nodule Post-op Dx: Right lower lobe neuroendocrine tumor Procedure: - Robotic assisted right video thoracoscopy -Right lower lobe wedge resection - Mediastinal lymph node sampling - Intercostal nerve block  Surgeon and Role:      * Jasenia Weilbacher, Lucile Crater, MD - Primary    *D. Tacy Dura, PA-C- assisting  Anesthesia  general EBL: 50 ml Blood Administration: None Specimen: Right lower lobe wedge.  Level 7 lymph nodes, hilar lymph node  Drains: 28 F argyle chest tube in right chest Counts: correct   Indications: 60 year old female with a 9 mm right lower lobe pulmonary nodule that is essentially been stable for the past 2 years.  She has a remote smoking history.  My view of the images the nodule does have very smooth borders, thus I am less concerned about this being a primary lung cancer.  That being said I think that proceeding directly with a surgical wedge resection would be ideal.  Furthermore explained to her that if this does prove to be a primary lung cancer then we will proceed with a lobectomy at the same time.  It is quite peripheral thus I do not think that we will need to attempt any navigational bronchoscopy for marking or biopsy.  She is in agreement to proceed, and she is tentatively scheduled for May 18 for a robotic assisted right lower lobe wedge resection possible lobectomy.  Findings: Small peripheral pulmonary nodule.  Pathology showed a neuroendocrine tumor.  Normal-appearing lymph nodes.   Operative Technique: After the risks, benefits and alternatives were thoroughly discussed, the patient was brought to the operative theatre.  Anesthesia was induced, and the patient was then placed in a left lateral decubitus position and was prepped and draped in normal sterile fashion.  An appropriate  surgical pause was performed, and pre-operative antibiotics were dosed accordingly.  We began by placing our 4 robotic ports in the the 7th intercostal space targeting the hilum of the lung.  A 32mm assistant port was placed in the 9th intercostal space in the anterior axillary line.  The robot was then docked and all instruments were passed under direct visualization.  The lower lobe was meticulously inspected for the small peripheral nodule.  There was a small area of density corresponding to the location of the CT scan.  A wedge resection of this area was performed.  The specimen was then placed in an Endo Catch bag and removed from the anterior port.  The frozen section demonstrated neuroendocrine tumor that was mediastinal and hilar nodes were sampled.  An intercostal nerve block was performed under direct visualization.  A 28 F chest with then placed, and we watch the remaining lobes re-expand.  The skin and soft tissue were closed with absorbable suture    The patient tolerated the procedure without any immediate complications, and was transferred to the PACU in stable condition.  Tosca Pletz Bary Leriche

## 2021-03-26 NOTE — Plan of Care (Signed)
  Problem: Education: Goal: Knowledge of disease or condition will improve Outcome: Progressing   Problem: Education: Goal: Knowledge of the prescribed therapeutic regimen will improve Outcome: Progressing   Problem: Activity: Goal: Risk for activity intolerance will decrease Outcome: Progressing   Problem: Cardiac: Goal: Will achieve and/or maintain hemodynamic stability Outcome: Progressing   Problem: Clinical Measurements: Goal: Postoperative complications will be avoided or minimized Outcome: Progressing   Problem: Pain Management: Goal: Pain level will decrease Outcome: Progressing   Problem: Skin Integrity: Goal: Wound healing without signs and symptoms infection will improve Outcome: Progressing

## 2021-03-27 ENCOUNTER — Encounter (HOSPITAL_COMMUNITY): Payer: Self-pay | Admitting: Thoracic Surgery (Cardiothoracic Vascular Surgery)

## 2021-03-27 ENCOUNTER — Inpatient Hospital Stay (HOSPITAL_COMMUNITY): Payer: 59

## 2021-03-27 LAB — BASIC METABOLIC PANEL
Anion gap: 7 (ref 5–15)
BUN: 9 mg/dL (ref 8–23)
CO2: 26 mmol/L (ref 22–32)
Calcium: 8.7 mg/dL — ABNORMAL LOW (ref 8.9–10.3)
Chloride: 101 mmol/L (ref 98–111)
Creatinine, Ser: 0.76 mg/dL (ref 0.44–1.00)
GFR, Estimated: >60 mL/min (ref >60)
Glucose, Bld: 120 mg/dL — ABNORMAL HIGH (ref 70–99)
Potassium: 4.1 mmol/L (ref 3.5–5.1)
Sodium: 134 mmol/L — ABNORMAL LOW (ref 135–145)

## 2021-03-27 LAB — CBC
HCT: 36.1 % (ref 36.0–46.0)
Hemoglobin: 12.1 g/dL (ref 12.0–15.0)
MCH: 32.2 pg (ref 26.0–34.0)
MCHC: 33.5 g/dL (ref 30.0–36.0)
MCV: 96 fL (ref 80.0–100.0)
Platelets: 308 10*3/uL (ref 150–400)
RBC: 3.76 MIL/uL — ABNORMAL LOW (ref 3.87–5.11)
RDW: 12.9 % (ref 11.5–15.5)
WBC: 9.6 10*3/uL (ref 4.0–10.5)
nRBC: 0 % (ref 0.0–0.2)

## 2021-03-27 MED ORDER — MORPHINE SULFATE (PF) 4 MG/ML IV SOLN
4.0000 mg | INTRAVENOUS | Status: DC | PRN
  Administered 2021-03-27 – 2021-03-28 (×7): 4 mg via INTRAVENOUS
  Filled 2021-03-27 (×8): qty 1

## 2021-03-27 MED ORDER — LIDOCAINE HCL (PF) 1 % IJ SOLN
INTRAMUSCULAR | Status: AC
  Administered 2021-03-27: 5 mL
  Filled 2021-03-27: qty 5

## 2021-03-27 MED ORDER — HYDROCHLOROTHIAZIDE 12.5 MG PO CAPS
12.5000 mg | ORAL_CAPSULE | Freq: Every day | ORAL | Status: DC
  Administered 2021-03-27 – 2021-03-28 (×2): 12.5 mg via ORAL
  Filled 2021-03-27 (×2): qty 1

## 2021-03-27 MED ORDER — LOSARTAN POTASSIUM 50 MG PO TABS
50.0000 mg | ORAL_TABLET | Freq: Every day | ORAL | Status: DC
  Administered 2021-03-27 – 2021-03-28 (×2): 50 mg via ORAL
  Filled 2021-03-27 (×2): qty 1

## 2021-03-27 NOTE — Plan of Care (Signed)
  Problem: Education: Goal: Knowledge of disease or condition will improve Outcome: Progressing Goal: Knowledge of the prescribed therapeutic regimen will improve Outcome: Progressing   Problem: Activity: Goal: Risk for activity intolerance will decrease Outcome: Progressing   Problem: Cardiac: Goal: Will achieve and/or maintain hemodynamic stability Outcome: Progressing   Problem: Clinical Measurements: Goal: Postoperative complications will be avoided or minimized Outcome: Progressing   Problem: Skin Integrity: Goal: Wound healing without signs and symptoms infection will improve Outcome: Progressing   Problem: Education: Goal: Knowledge of General Education information will improve Description: Including pain rating scale, medication(s)/side effects and non-pharmacologic comfort measures Outcome: Progressing   Problem: Health Behavior/Discharge Planning: Goal: Ability to manage health-related needs will improve Outcome: Progressing   Problem: Clinical Measurements: Goal: Ability to maintain clinical measurements within normal limits will improve Outcome: Progressing Goal: Will remain free from infection Outcome: Progressing Goal: Diagnostic test results will improve Outcome: Progressing Goal: Cardiovascular complication will be avoided Outcome: Progressing   Problem: Activity: Goal: Risk for activity intolerance will decrease Outcome: Progressing   Problem: Nutrition: Goal: Adequate nutrition will be maintained Outcome: Progressing   Problem: Coping: Goal: Level of anxiety will decrease Outcome: Progressing   Problem: Elimination: Goal: Will not experience complications related to bowel motility Outcome: Progressing Goal: Will not experience complications related to urinary retention Outcome: Progressing   Problem: Safety: Goal: Ability to remain free from injury will improve Outcome: Progressing   Problem: Skin Integrity: Goal: Risk for impaired  skin integrity will decrease Outcome: Progressing   Problem: Respiratory: Goal: Respiratory status will improve Outcome: Not Progressing   Problem: Pain Management: Goal: Pain level will decrease Outcome: Not Progressing   Problem: Clinical Measurements: Goal: Respiratory complications will improve Outcome: Not Progressing   Problem: Pain Managment: Goal: General experience of comfort will improve Outcome: Not Progressing

## 2021-03-27 NOTE — Progress Notes (Signed)
Mobility Specialist - Progress Note   03/27/21 1356  Mobility  Activity Ambulated in hall  Level of Assistance Independent after set-up  Assistive Device None  Distance Ambulated (ft) 1200 ft  Mobility Ambulated with assistance in hallway  Mobility Response Tolerated well  Mobility performed by Mobility specialist  $Mobility charge 1 Mobility   Pt asx throughout ambulation. Pt sitting up on edge of bed after walk, call bell at side. VSS throughout.  Patricia Mathews Mobility Specialist Mobility Specialist Phone: [phone]

## 2021-03-27 NOTE — Anesthesia Postprocedure Evaluation (Signed)
Anesthesia Post Note  Patient: Patricia Mathews  Procedure(s) Performed: XI ROBOTIC ASSISTED THORASCOPY-RIGHT LOWER LOBE WEDGE RESECTION (Right Chest) INTERCOSTAL NERVE BLOCK (Right ) LYMPH NODE DISSECTION     Patient location during evaluation: PACU Anesthesia Type: General Level of consciousness: awake and alert Pain management: pain level controlled Vital Signs Assessment: post-procedure vital signs reviewed and stable Respiratory status: spontaneous breathing, nonlabored ventilation, respiratory function stable and patient connected to nasal cannula oxygen Cardiovascular status: blood pressure returned to baseline and stable Postop Assessment: no apparent nausea or vomiting Anesthetic complications: no   No complications documented.  Last Vitals:  Vitals:   03/27/21 0743 03/27/21 1203  BP: 111/62 130/71  Pulse: 68 61  Resp: 16   Temp: 36.7 C 36.7 C  SpO2: 100% 95%    Last Pain:  Vitals:   03/27/21 1206  TempSrc:   PainSc: 9                  Tiajuana Amass

## 2021-03-27 NOTE — Plan of Care (Signed)
  Problem: Education: Goal: Knowledge of disease or condition will improve Outcome: Progressing Goal: Knowledge of the prescribed therapeutic regimen will improve Outcome: Progressing   Problem: Activity: Goal: Risk for activity intolerance will decrease Outcome: Progressing   Problem: Cardiac: Goal: Will achieve and/or maintain hemodynamic stability Outcome: Progressing   Problem: Clinical Measurements: Goal: Postoperative complications will be avoided or minimized Outcome: Progressing   Problem: Respiratory: Goal: Respiratory status will improve Outcome: Progressing

## 2021-03-27 NOTE — Progress Notes (Addendum)
      [address]       [place] [address]             [phone]      1 Day Post-Op Procedure(s) (LRB): XI ROBOTIC ASSISTED THORASCOPY-RIGHT LOWER LOBE WEDGE RESECTION (Right) INTERCOSTAL NERVE BLOCK (Right) LYMPH NODE DISSECTION   Subjective:  Patient having a lot of pain along her right side.  States it started last night and has been pretty constant.  She gets relief with medication, it just doesn't last very long.  She is asking if her Morphine dose can be increased.  Objective: Vital signs in last 24 hours: Temp:  [97.7 F (36.5 C)-98.4 F (36.9 C)] 97.7 F (36.5 C) (05/19 0549) Pulse Rate:  [63-96] 63 (05/19 0549) Cardiac Rhythm: Normal sinus rhythm;Bundle branch block (05/19 0720) Resp:  [14-24] 16 (05/19 0549) BP: (101-159)/(54-94) 110/67 (05/19 0549) SpO2:  [92 %-100 %] 98 % (05/19 0549)  Intake/Output from previous day: 05/18 0701 - 05/19 0700 In: 2200 [P.O.:800; I.V.:1200; IV Piggyback:200] Out: 475 [Urine:200; Blood:25; Chest Tube:250]  General appearance: alert, cooperative and no distress Heart: regular rate and rhythm Lungs: clear to auscultation bilaterally Abdomen: soft, non-tender; bowel sounds normal; no masses,  no organomegaly Extremities: extremities normal, atraumatic, no cyanosis or edema Wound: clean and dry  Lab Results: Recent Labs    03/24/21 1034 03/27/21 0028  WBC 7.0 9.6  HGB 13.4 12.1  HCT 40.2 36.1  PLT 334 308   BMET:  Recent Labs    03/24/21 1034 03/27/21 0028  NA 135 134*  K 3.8 4.1  CL 103 101  CO2 23 26  GLUCOSE 104* 120*  BUN 16 9  CREATININE 0.72 0.76  CALCIUM 9.0 8.7*    PT/INR:  Recent Labs    03/24/21 1034  LABPROT 12.6  INR 0.9   ABG    Component Value Date/Time   PHART 7.413 03/24/2021 1034   HCO3 26.0 03/24/2021 1034   TCO2 27 11/05/2020 0831   O2SAT 95.6 03/24/2021 1034   CBG (last 3)  No results for input(s): GLUCAP in the last 72 hours.  Assessment/Plan: S/P  Procedure(s) (LRB): XI ROBOTIC ASSISTED THORASCOPY-RIGHT LOWER LOBE WEDGE RESECTION (Right) INTERCOSTAL NERVE BLOCK (Right) LYMPH NODE DISSECTION  1. CV- hemodynamically stable in NSR, HTN- restart home Hyzaar 2. Pulm- no air leak, some tidaling with cough-- output 300 cc since surgery, CXR shows apical pneumothorax on right.. leave chest tube to water seal today, repeat CXR in AM 3. Renal- creatinine, lytes okay 4. Pain control- continue Toradol, will increase Morphine to 4 mg, however told patient that once her chest tube is removed, we will stop Morphine to get her pain controlled on oral regimen for discharge, she was agreeable to this plan 5. Dispo- patient stable, HTN restart home medications, CT is free from air leak, some tidaling, 300 cc output since surgery with trace apical pneumothorax on CXR, leave tube in place today, repeat CXR in AM  LOS: 1 day    Ellwood Handler, PA-C 03/27/2021   Agree with above Continue pulm toilet Clamping trial  Lajuana Matte

## 2021-03-28 ENCOUNTER — Inpatient Hospital Stay (HOSPITAL_COMMUNITY): Payer: 59

## 2021-03-28 ENCOUNTER — Other Ambulatory Visit (HOSPITAL_COMMUNITY): Payer: Self-pay

## 2021-03-28 LAB — COMPREHENSIVE METABOLIC PANEL
ALT: 26 U/L (ref 0–44)
AST: 18 U/L (ref 15–41)
Albumin: 3.4 g/dL — ABNORMAL LOW (ref 3.5–5.0)
Alkaline Phosphatase: 59 U/L (ref 38–126)
Anion gap: 7 (ref 5–15)
BUN: 13 mg/dL (ref 8–23)
CO2: 30 mmol/L (ref 22–32)
Calcium: 8.8 mg/dL — ABNORMAL LOW (ref 8.9–10.3)
Chloride: 99 mmol/L (ref 98–111)
Creatinine, Ser: 1.06 mg/dL — ABNORMAL HIGH (ref 0.44–1.00)
GFR, Estimated: 60 mL/min — ABNORMAL LOW (ref >60)
Glucose, Bld: 126 mg/dL — ABNORMAL HIGH (ref 70–99)
Potassium: 4 mmol/L (ref 3.5–5.1)
Sodium: 136 mmol/L (ref 135–145)
Total Bilirubin: 0.6 mg/dL (ref 0.3–1.2)
Total Protein: 6.6 g/dL (ref 6.5–8.1)

## 2021-03-28 LAB — CBC
HCT: 37.1 % (ref 36.0–46.0)
Hemoglobin: 11.9 g/dL — ABNORMAL LOW (ref 12.0–15.0)
MCH: 31.6 pg (ref 26.0–34.0)
MCHC: 32.1 g/dL (ref 30.0–36.0)
MCV: 98.4 fL (ref 80.0–100.0)
Platelets: 301 10*3/uL (ref 150–400)
RBC: 3.77 MIL/uL — ABNORMAL LOW (ref 3.87–5.11)
RDW: 13.1 % (ref 11.5–15.5)
WBC: 8.3 10*3/uL (ref 4.0–10.5)
nRBC: 0 % (ref 0.0–0.2)

## 2021-03-28 LAB — SURGICAL PATHOLOGY

## 2021-03-28 MED ORDER — TRAMADOL HCL 50 MG PO TABS
50.0000 mg | ORAL_TABLET | Freq: Four times a day (QID) | ORAL | 0 refills | Status: DC | PRN
  Filled 2021-03-28: qty 30, 4d supply, fill #0

## 2021-03-28 NOTE — Plan of Care (Signed)
  Problem: Education: Goal: Knowledge of disease or condition will improve Outcome: Adequate for Discharge Goal: Knowledge of the prescribed therapeutic regimen will improve Outcome: Adequate for Discharge   Problem: Activity: Goal: Risk for activity intolerance will decrease Outcome: Adequate for Discharge   Problem: Cardiac: Goal: Will achieve and/or maintain hemodynamic stability Outcome: Adequate for Discharge   Problem: Clinical Measurements: Goal: Postoperative complications will be avoided or minimized Outcome: Adequate for Discharge   Problem: Respiratory: Goal: Respiratory status will improve Outcome: Adequate for Discharge   Problem: Pain Management: Goal: Pain level will decrease Outcome: Adequate for Discharge   Problem: Skin Integrity: Goal: Wound healing without signs and symptoms infection will improve Outcome: Adequate for Discharge   Problem: Education: Goal: Knowledge of General Education information will improve Description: Including pain rating scale, medication(s)/side effects and non-pharmacologic comfort measures Outcome: Adequate for Discharge   Problem: Health Behavior/Discharge Planning: Goal: Ability to manage health-related needs will improve Outcome: Adequate for Discharge   Problem: Clinical Measurements: Goal: Ability to maintain clinical measurements within normal limits will improve Outcome: Adequate for Discharge Goal: Will remain free from infection Outcome: Adequate for Discharge Goal: Diagnostic test results will improve Outcome: Adequate for Discharge Goal: Respiratory complications will improve Outcome: Adequate for Discharge Goal: Cardiovascular complication will be avoided Outcome: Adequate for Discharge   Problem: Activity: Goal: Risk for activity intolerance will decrease Outcome: Adequate for Discharge   Problem: Nutrition: Goal: Adequate nutrition will be maintained Outcome: Adequate for Discharge   Problem:  Coping: Goal: Level of anxiety will decrease Outcome: Adequate for Discharge   Problem: Elimination: Goal: Will not experience complications related to bowel motility Outcome: Adequate for Discharge Goal: Will not experience complications related to urinary retention Outcome: Adequate for Discharge   Problem: Pain Managment: Goal: General experience of comfort will improve Outcome: Adequate for Discharge   Problem: Safety: Goal: Ability to remain free from injury will improve Outcome: Adequate for Discharge   Problem: Skin Integrity: Goal: Risk for impaired skin integrity will decrease Outcome: Adequate for Discharge

## 2021-03-28 NOTE — Discharge Instructions (Signed)
Robot-Assisted Thoracic Surgery, Care After The following information offers guidance on how to care for yourself after your procedure. Your health care provider may also give you more specific instructions. If you have problems or questions, contact your health care provider. What can I expect after the procedure? After the procedure, it is common to have:  Some pain and aches in the area of your surgical incisions.  Pain when breathing in (inhaling) and coughing.  Tiredness (fatigue).  Trouble sleeping.  Constipation. Follow these instructions at home: Medicines  Take over-the-counter and prescription medicines only as told by your health care provider.  If you were prescribed an antibiotic medicine, take it as told by your health care provider. Do not stop taking the antibiotic even if you start to feel better.  Talk with your health care provider about safe and effective ways to manage pain after your procedure. Pain management should fit your specific health needs.  Take pain medicine before pain becomes severe. Relieving and controlling your pain will make breathing easier for you.  Ask your health care provider if the medicine prescribed to you requires you to avoid driving or using machinery. Eating and drinking Follow instructions from your health care provider about eating or drinking restrictions. These will vary depending on what procedure you had. Your health care provider may recommend:  A liquid diet or soft diet for the first few days.  Meals that are smaller and more frequent.  A diet of fruits, vegetables, whole grains, and low-fat proteins.  Limiting foods that are high in fat and processed sugar, including fried or sweet foods. Incision care  Follow instructions from your health care provider about how to take care of your incisions. Make sure you: ? Wash your hands with soap and water for at least 20 seconds before and after you change your bandage  (dressing). If soap and water are not available, use hand sanitizer. ? Change your dressing as told by your health care provider. ? Leave stitches (sutures), skin glue, or adhesive strips in place. These skin closures may need to stay in place for 2 weeks or longer. If adhesive strip edges start to loosen and curl up, you may trim the loose edges. Do not remove adhesive strips completely unless your health care provider tells you to do that.  Check your incision area every day for signs of infection. Check for: ? Redness, swelling, or more pain. ? Fluid or blood. ? Warmth. ? Pus or a bad smell. Activity  Return to your normal activities as told by your health care provider. Ask your health care provider what activities are safe for you.  Ask your health care provider when it is safe for you to drive.  Do not lift anything that is heavier than 10 lb (4.5 kg), or the limit that you are told, until your health care provider says that it is safe.  Rest as told by your health care provider.  Avoid sitting for a long time without moving. Get up to take short walks every 1-2 hours. This is important to improve blood flow and breathing. Ask for help if you feel weak or unsteady.  Do exercises as told by your health care provider. Pneumonia prevention  Do deep breathing exercises and cough regularly as directed. This helps clear mucus and opens your lungs. Doing this helps prevent lung infection (pneumonia).  If you were given an incentive spirometer, use it as told. An incentive spirometer is a tool that measures how  well you are filling your lungs with each breath.  Coughing may hurt less if you try to support your chest. This is called splinting. Try one of these when you cough: ? Hold a pillow against your chest. ? Place the palms of both hands on top of your incision area.  Do not use any products that contain nicotine or tobacco. These products include cigarettes, chewing tobacco, and  vaping devices, such as e-cigarettes. If you need help quitting, ask your health care provider.  Avoid secondhand smoke.   General instructions  If you have a drainage tube: ? Follow instructions from your health care provider about how to take care of it. ? Do not travel by airplane after your tube is removed until your health care provider tells you it is safe.  You may need to take these actions to prevent or treat constipation: ? Drink enough fluid to keep your urine pale yellow. ? Take over-the-counter or prescription medicines. ? Eat foods that are high in fiber, such as beans, whole grains, and fresh fruits and vegetables. ? Limit foods that are high in fat and processed sugars, such as fried or sweet foods.  Keep all follow-up visits. This is important. Contact a health care provider if:  You have redness, swelling, or more pain around an incision.  You have fluid or blood coming from an incision.  An incision feels warm to the touch.  You have pus or a bad smell coming from an incision.  You have a fever.  You cannot eat or drink without vomiting.  Your pain medicine is not controlling your pain. Get help right away if:  You have chest pain.  Your heart is beating quickly.  You have trouble breathing.  You have trouble speaking.  You are confused.  You feel weak or dizzy, or you faint. These symptoms may represent a serious problem that is an emergency. Do not wait to see if the symptoms will go away. Get medical help right away. Call your local emergency services (911 in the [place] Do not drive yourself to the hospital. Summary  Talk with your health care provider about safe and effective ways to manage pain after your procedure. Pain management should fit your specific health needs.  Return to your normal activities as told by your health care provider. Ask your health care provider what activities are safe for you.  Do deep breathing exercises and cough  regularly as directed. This helps to clear mucus and prevent pneumonia. If it hurts to cough, ease pain by holding a pillow against your chest or by placing the palms of both hands over your incisions. This information is not intended to replace advice given to you by your health care provider. Make sure you discuss any questions you have with your health care provider. Document Revised: 07/19/2020 Document Reviewed: 07/19/2020 Elsevier Patient Education  2021 [employer].    Pneumothorax A pneumothorax is commonly called a collapsed lung. It is a condition in which air leaks from a lung and builds up between the thin layer of tissue that covers the lungs (visceral pleura) and the interior wall of the chest cavity (parietal pleura). The air gets trapped outside the lung, between the lung and the chest wall (pleural space). The air takes up space and prevents the lung from fully expanding. This condition sometimes occurs suddenly with no apparent cause. The buildup of air may be small or large. A small pneumothorax may go away on its own.  A large pneumothorax will require treatment and hospitalization. What are the causes? This condition may be caused by:  Trauma and injury to the chest wall.  Surgery and other medical procedures.  A complication of an underlying lung problem, especially chronic obstructive pulmonary disease (COPD) or emphysema. Sometimes the cause of this condition is not known. What increases the risk? You are more likely to develop this condition if:  You have an underlying lung problem.  You smoke.  You are 74-71 years old, female, tall, and underweight.  You have a personal or family history of pneumothorax.  You have an eating disorder (anorexia nervosa). This condition can also happen quickly, even in people with no history of lung problems. What are the signs or symptoms? Sometimes a pneumothorax will have no symptoms. When symptoms are present, they can  include:  Chest pain.  Shortness of breath.  Increased rate of breathing.  Bluish color to your lips or skin (cyanosis). How is this diagnosed? This condition may be diagnosed by:  A medical history and physical exam.  A chest X-ray, chest CT scan, or ultrasound. How is this treated? Treatment depends on how severe your condition is. The goal of treatment is to remove the extra air and allow your lung to expand back to its normal size.  For a small pneumothorax: ? No treatment may be needed. ? Extra oxygen is sometimes used to make it go away more quickly.  For a large pneumothorax or one that is causing symptoms, a procedure is done to release the air from around your lungs. To do this, a health care provider may use: ? A needle with a syringe. This is used to suck air from a pleural space where no additional leakage is taking place. ? A chest tube. This is used to suck air where there is ongoing leakage into the pleural space. The chest tube may need to remain in place for several days until the air leak has healed.  In more severe cases, surgery may be needed to repair the damage that is causing the leak.  If you have multiple pneumothorax episodes or have an air leak that will not heal, a procedure called a pleurodesis may be done. A medicine is placed in the pleural space to irritate the tissues around the lung so that the lung will stick to the chest wall, seal any leaks, and stop any buildup of air in that space. If you have an underlying lung problem, severe symptoms, or a large pneumothorax you will usually need to stay in the hospital.   Follow these instructions at home: Lifestyle  Do not use any products that contain nicotine or tobacco, such as cigarettes and e-cigarettes. These are major risk factors in pneumothorax. If you need help quitting, ask your health care provider.  Do not lift anything that is heavier than 10 lb (4.5 kg), or the limit that your health care  provider tells you, until he or she says that it is safe.  Avoid activities that take a lot of effort (strenuous) for as long as told by your health care provider.  Return to your normal activities as told by your health care provider. Ask your health care provider what activities are safe for you.  Do not fly in an airplane or scuba dive until your health care provider says it is okay. General instructions  Take over-the-counter and prescription medicines only as told by your health care provider.  If a cough or pain  makes it difficult for you to sleep at night, try sleeping in a semi-upright position in a recliner or by using 2 or 3 pillows.  If you had a chest tube and it was removed, ask your health care provider when you can remove the bandage (dressing). While the dressing is in place, do not allow it to get wet.  Keep all follow-up visits as told by your health care provider. This is important. Contact a health care provider if:  You cough up thick mucus (sputum) that is yellow or green in color.  You were treated with a chest tube, and you have redness, increasing pain, or discharge at the site where it was placed. Get help right away if:  You have increasing chest pain or shortness of breath.  You have a cough that will not go away.  You begin coughing up blood.  You have pain that is getting worse or is not controlled with medicines.  The site where your chest tube was located opens up.  You feel air coming out of the site where the chest tube was placed.  You have a fever or persistent symptoms for more than 2-3 days. These symptoms may represent a serious problem that is an emergency. Do not wait to see if the symptoms will go away. Get medical help right away. Call your local emergency services (911 in the [place] Do not drive yourself to the hospital. Summary  A pneumothorax, commonly called a collapsed lung, is a condition in which air leaks from a lung and gets  trapped between the lung and the chest wall (pleural space).  The buildup of air may be small or large. A small pneumothorax may go away on its own. A large pneumothorax will require treatment and hospitalization.  Treatment for this condition depends on how severe the pneumothorax is. The goal of treatment is to remove the extra air and allow the lung to expand back to its normal size. This information is not intended to replace advice given to you by your health care provider. Make sure you discuss any questions you have with your health care provider. Document Revised: 06/27/2020 Document Reviewed: 06/27/2020 Elsevier Patient Education  2021 [employer].

## 2021-03-28 NOTE — Progress Notes (Addendum)
      [address]       [place] [address]             [phone]      2 Days Post-Op Procedure(s) (LRB): XI ROBOTIC ASSISTED THORASCOPY-RIGHT LOWER LOBE WEDGE RESECTION (Right) INTERCOSTAL NERVE BLOCK (Right) LYMPH NODE DISSECTION   Subjective:  Patient continues to have pain.  Just used IV pain medication early morning.  + flatus  Objective: Vital signs in last 24 hours: Temp:  [98 F (36.7 C)-98.9 F (37.2 C)] 98.9 F (37.2 C) (05/20 0710) Pulse Rate:  [60-78] 63 (05/20 0710) Cardiac Rhythm: Normal sinus rhythm (05/19 1900) Resp:  [14-16] 15 (05/20 0710) BP: (103-130)/(50-71) 110/67 (05/20 0710) SpO2:  [95 %-100 %] 97 % (05/20 0710)  Intake/Output from previous day: 05/19 0701 - 05/20 0700 In: 600 [P.O.:600] Out: 90 [Chest Tube:90]  General appearance: alert, cooperative and no distress Heart: regular rate and rhythm Lungs: clear to auscultation bilaterally Abdomen: soft, non-tender; bowel sounds normal; no masses,  no organomegaly Extremities: extremities normal, atraumatic, no cyanosis or edema Wound: clean and dry  Lab Results: Recent Labs    03/27/21 0028 03/28/21 0054  WBC 9.6 8.3  HGB 12.1 11.9*  HCT 36.1 37.1  PLT 308 301   BMET:  Recent Labs    03/27/21 0028 03/28/21 0054  NA 134* 136  K 4.1 4.0  CL 101 99  CO2 26 30  GLUCOSE 120* 126*  BUN 9 13  CREATININE 0.76 1.06*  CALCIUM 8.7* 8.8*    PT/INR: No results for input(s): LABPROT, INR in the last 72 hours. ABG    Component Value Date/Time   PHART 7.413 03/24/2021 1034   HCO3 26.0 03/24/2021 1034   TCO2 27 11/05/2020 0831   O2SAT 95.6 03/24/2021 1034   CBG (last 3)  No results for input(s): GLUCAP in the last 72 hours.  Assessment/Plan: S/P Procedure(s) (LRB): XI ROBOTIC ASSISTED THORASCOPY-RIGHT LOWER LOBE WEDGE RESECTION (Right) INTERCOSTAL NERVE BLOCK (Right) LYMPH NODE DISSECTION  1. CV- hemodynamically stable in NSR,  HTN- on Hyzaar 2. Pulm- CT  output, will discuss repeat CXR with Dr. Kipp Brood 3. Renal- creatinine stable, K at 4.0 4. Pain control- stop Morphine, Toradol.. continue Tylenol, Tramadol 5. Dispo- patient stable, needs to see if oral agents will control pain, may need to adjust regimen, will check back at lunch time if pain control is achieved will d/c home today   LOS: 2 days    Ellwood Handler, PA-C 03/28/2021   Agree with above CXR improved Home today  [place]

## 2021-03-31 ENCOUNTER — Other Ambulatory Visit: Payer: Self-pay | Admitting: *Deleted

## 2021-03-31 ENCOUNTER — Encounter: Payer: Self-pay | Admitting: *Deleted

## 2021-03-31 ENCOUNTER — Telehealth: Payer: Self-pay | Admitting: *Deleted

## 2021-03-31 DIAGNOSIS — Z9889 Other specified postprocedural states: Secondary | ICD-10-CM

## 2021-03-31 NOTE — Telephone Encounter (Signed)
Patricia Mathews, Mrs. Able's, daughter contacted the office requesting an earlier f/u appointment with Dr. Kipp Brood. Patient is s/p RAT's wedge resection 5/18. Per daughter, patient has coughed up scant amount of blood tinged mucus since discharge from hospital. Daughter also states father is a physician and he is able to hear "crackles at the base of her lungs upon auscultation." Denies SOB. Family has been urging patient to use incentive spirometer routinely as well as encouraging frequent ambulation. Appointment has been rescheduled with cxr for Thursday 5/26 at 3:40. Advised daughter to call back with any other concerns. Patricia Mathews verbalized understanding.

## 2021-03-31 NOTE — Patient Outreach (Addendum)
[facility]) Care Management  03/31/2021  Patricia Mathews 08-24-1961 [id number]   Transition of care call   Referral received:03/26/21 Initial outreach:03/31/21 Insurance: [place] [employer]    Subjective: Initial successful telephone call to patient's preferred number in order to complete transition of care assessment; 2 HIPAA identifiers verified. Explained purpose of call . Patient states that she has placed call to Dr. Kipp Brood office and awaiting return call to report complaint of noted coughing up blood and comfort of chest discomfort when coughing. She request return call later today. Returned call to patient :   She reports doing okay, she has spoken with surgeon office and has a sooner office visit on this week. Patient discussed coughing up blood colored sputum noted especially in the morning but clears up as day goes on. Reinforced use of incentive spirometry, she reports having devices and using frequently. She reports tolerating mobility in home fatigues easily. She discussed not having much appetite, discussed smaller frequent meals. She reports incision areas intact with staples and sutures and in place at CT areas. She reports that pain in managed with prn tylenol and tramadol .  Spouse that is a physician and daughter are assisting with her  recovery.   Reviewed accessing the following [place] Benefits : She uses a [facility] outpatient pharmacy at [facility].   Objective:  Per electronic medical record, Patricia Mathews  was hospitalized at [facility] from 5/18-5/20/22  For Robotic assisted VATS, Right lower lobe wedge resection   Comorbidities include: Fibromyalgia hypertension , OSA on CPAP, systemic lupus.   She was discharged to home on 03/28/21 without the need for home health services or DME.   Assessment:  Patient voices good understanding of all discharge instructions.  See transition of care flowsheet for assessment details.    Plan:  Reviewed hospital discharge diagnosis Robotic assisted VATS, Right lower lobe wedge resectionof    and discharge treatment plan using hospital discharge instructions, assessing medication adherence, reviewing problems requiring provider notification, and discussing the importance of follow up with surgeon, primary care provider and/or specialists as directed. Reinforced notifying surgeon sooner for new or worsening symptoms.   Reviewed [place] healthy lifestyle program information to receive discounted premium for  2023   Step 1: Get  your annual physical  Step 2: Complete your health assessment  Step 3:Identify your current health status and complete the corresponding action step between November 09, 2020 and July 10, 2021.       Patient agreeable to call for transition of care follow up in the next week.  Will  route successful outreach letter with [facility] Management pamphlet and 24 Hour Nurse Line Magnet to [place] Management clinical pool to be mailed to patient's home address.   [name], RN, BSN  [place] Management Coordinator  [phone]- Mobile [phone]- Toll Free Main Office

## 2021-04-03 ENCOUNTER — Other Ambulatory Visit: Payer: Self-pay | Admitting: Thoracic Surgery (Cardiothoracic Vascular Surgery)

## 2021-04-03 ENCOUNTER — Encounter: Payer: Self-pay | Admitting: *Deleted

## 2021-04-03 ENCOUNTER — Other Ambulatory Visit: Payer: Self-pay

## 2021-04-03 ENCOUNTER — Other Ambulatory Visit (HOSPITAL_COMMUNITY): Payer: Self-pay

## 2021-04-03 ENCOUNTER — Ambulatory Visit
Admission: RE | Admit: 2021-04-03 | Discharge: 2021-04-03 | Disposition: A | Discharge status: Home / Self Care | Payer: 59 | Source: Ambulatory Visit | Attending: Thoracic Surgery (Cardiothoracic Vascular Surgery) | Admitting: Thoracic Surgery (Cardiothoracic Vascular Surgery)

## 2021-04-03 ENCOUNTER — Ambulatory Visit (INDEPENDENT_AMBULATORY_CARE_PROVIDER_SITE_OTHER): Payer: Self-pay | Admitting: Thoracic Surgery (Cardiothoracic Vascular Surgery)

## 2021-04-03 ENCOUNTER — Ambulatory Visit: Payer: 59 | Admitting: Thoracic Surgery (Cardiothoracic Vascular Surgery)

## 2021-04-03 ENCOUNTER — Telehealth: Payer: Self-pay | Admitting: Internal Medicine

## 2021-04-03 VITALS — BP 130/80 | HR 102 | Resp 20 | Ht 65.0 in | Wt 215.0 lb

## 2021-04-03 DIAGNOSIS — R911 Solitary pulmonary nodule: Secondary | ICD-10-CM

## 2021-04-03 DIAGNOSIS — Z9889 Other specified postprocedural states: Secondary | ICD-10-CM

## 2021-04-03 DIAGNOSIS — R918 Other nonspecific abnormal finding of lung field: Secondary | ICD-10-CM | POA: Diagnosis not present

## 2021-04-03 MED ORDER — HYDROCODONE-ACETAMINOPHEN 5-325 MG PO TABS
1.0000 | ORAL_TABLET | Freq: Four times a day (QID) | ORAL | 0 refills | Status: DC | PRN
  Filled 2021-04-03: qty 40, 10d supply, fill #0

## 2021-04-03 NOTE — Progress Notes (Signed)
Per Dr. Julien Nordmann, CMP ordered

## 2021-04-03 NOTE — Telephone Encounter (Signed)
Scheduled per referral. Called and spoke with pt confirmed 6/10 appts

## 2021-04-03 NOTE — Progress Notes (Signed)
I received referral on Patricia Mathews.  I updated new patient coordinator to call and schedule her to be seen on 04/21/21 with labs and to see Dr. Julien Nordmann.

## 2021-04-04 ENCOUNTER — Encounter: Payer: Self-pay | Admitting: Thoracic Surgery (Cardiothoracic Vascular Surgery)

## 2021-04-04 NOTE — Progress Notes (Signed)
[address]       [place] [address]             [phone]        [place] E Kij[facility] Medical Record #[id number] Date of Birth: Nov 15, 1960  Referring: Garner Nash, DO Primary Care: Horald Pollen, MD Primary Cardiologist:None  Reason for visit:   follow-up  History of Present Illness:     The patient presents for her 1 week follow-up appointment.  She complains mostly of pain at the incisions.  She also has some exertional dyspnea.  Physical Exam: BP 130/80   Pulse (!) 102   Resp 20   Ht _0  (1.651 m)   Wt 215 lb (97.5 kg)   SpO2 94% Comment: RA  BMI 35.78 kg/m   Alert NAD Incision clean, stitch removed.   Abdomen ND Extremities   Diagnostic Studies & Laboratory data: CXR: Resolution of pneumothorax Path:   FINAL MICROSCOPIC DIAGNOSIS:   A. LUNG, RIGHT LOWER LOBE, WEDGE EXCISION:  - Carcinoid tumorlet, 4 mm.  - Tumor is limited to lung.  - Margin is negative for tumor.   B. LYMPH NODE, HILAR #1, EXCISION:  - One of one lymph nodes negative for tumor (0/1).   C. LYMPH NODE, HILAR #2, EXCISION:  - One of one lymph nodes negative for tumor (0/1).   D. LYMPH NODE, LEVEL 7, EXCISION:  - One of one lymph nodes negative for tumor (0/1).   COMMENT:   A. CD56 and synaptophysin are positive. Ki-67 is low. The findings are  consistent with a low grade neuroendocrine tumor and based on the size  of <5 mm, specifically a carcinoid tumorlet.    INTRAOPERATIVE DIAGNOSIS:   A. Right lower lobe wedge: "Neoplasm, favor low-grade neuroendocrine  tumor. Margins not involved."  Intraoperative diagnosis rendered by Dr. Saralyn Pilar at 10:34 AM on  03/26/2021.   B. Hilar node #1: "Benign lymph node"  Intraoperative diagnosis rendered by Dr. Saralyn Pilar at 10:49 AM on  03/26/2021.   C. Hilar node #2: "Benign lymph node"  Intraoperative diagnosis rendered by Dr. Saralyn Pilar at 10:49 AM on  03/26/2021.     Assessment / Plan:    60 year old female status post right robotic assisted thoracoscopy and wedge resection.  Pathology did reveal carcinoid tumor.  It was very small.  I have referred her to medical oncology to close the loop.  I will see her back in 1 month with a chest x-ray.   Lajuana Matte 04/04/2021 7:49 AM

## 2021-04-10 ENCOUNTER — Other Ambulatory Visit: Payer: Self-pay | Admitting: *Deleted

## 2021-04-10 NOTE — Patient Outreach (Signed)
[facility]) Care Management  04/10/2021  Patricia Mathews [birth year] [id number]   Transition of Care /follow up call /Case Closure    Referral received:03/26/21 Initial outreach:03/31/21 Insurance: [employer] [employer]   Subjective: Successful outreach call to patient , she reports that is is doing so much better. She reports tolerating mobility in home, managing mobility in 3 level home with fatigue. She voiced continuing to use her incentive spirometry.  She discussed incisions healing well, reports still having cough nonproductive.  She discussed follow up with oncology in the next week.  She expressed appreciation of the call and of care received while inpatient   Objective:  [facility]  For Robotic assisted VATS, Right lower lobe wedge resection   Comorbidities include: Fibromyalgia hypertension , OSA on CPAP, systemic lupus.   She was discharged to home on 03/28/21 without the need for home health servicesor DME.   Plan No ongoing care coordination needs identified , will close to [facility] care management at this time.   [name], RN, BSN  [place] Management Coordinator  [phone]- Mobile [phone]- Toll Free Main Office

## 2021-04-11 ENCOUNTER — Telehealth: Payer: 59 | Admitting: Thoracic Surgery (Cardiothoracic Vascular Surgery)

## 2021-04-11 ENCOUNTER — Ambulatory Visit: Payer: 59 | Admitting: Thoracic Surgery (Cardiothoracic Vascular Surgery)

## 2021-04-18 ENCOUNTER — Other Ambulatory Visit (HOSPITAL_COMMUNITY): Payer: Self-pay

## 2021-04-18 ENCOUNTER — Other Ambulatory Visit: Payer: Self-pay

## 2021-04-18 ENCOUNTER — Other Ambulatory Visit: Payer: Self-pay | Admitting: Internal Medicine

## 2021-04-18 ENCOUNTER — Encounter: Payer: Self-pay | Admitting: Internal Medicine

## 2021-04-18 ENCOUNTER — Inpatient Hospital Stay (HOSPITAL_BASED_OUTPATIENT_CLINIC_OR_DEPARTMENT_OTHER): Payer: 59 | Admitting: Internal Medicine

## 2021-04-18 ENCOUNTER — Inpatient Hospital Stay: Payer: 59 | Attending: Internal Medicine

## 2021-04-18 VITALS — BP 142/77 | HR 81 | Temp 97.8°F | Resp 16 | Ht 65.0 in | Wt 218.5 lb

## 2021-04-18 DIAGNOSIS — K5903 Drug induced constipation: Secondary | ICD-10-CM | POA: Insufficient documentation

## 2021-04-18 DIAGNOSIS — I10 Essential (primary) hypertension: Secondary | ICD-10-CM

## 2021-04-18 DIAGNOSIS — Z8051 Family history of malignant neoplasm of kidney: Secondary | ICD-10-CM | POA: Insufficient documentation

## 2021-04-18 DIAGNOSIS — Z801 Family history of malignant neoplasm of trachea, bronchus and lung: Secondary | ICD-10-CM | POA: Diagnosis not present

## 2021-04-18 DIAGNOSIS — C7A090 Malignant carcinoid tumor of the bronchus and lung: Secondary | ICD-10-CM

## 2021-04-18 DIAGNOSIS — C3490 Malignant neoplasm of unspecified part of unspecified bronchus or lung: Secondary | ICD-10-CM

## 2021-04-18 DIAGNOSIS — K5900 Constipation, unspecified: Secondary | ICD-10-CM

## 2021-04-18 DIAGNOSIS — M797 Fibromyalgia: Secondary | ICD-10-CM | POA: Insufficient documentation

## 2021-04-18 DIAGNOSIS — Z8042 Family history of malignant neoplasm of prostate: Secondary | ICD-10-CM | POA: Diagnosis not present

## 2021-04-18 DIAGNOSIS — M3500 Sicca syndrome, unspecified: Secondary | ICD-10-CM | POA: Insufficient documentation

## 2021-04-18 DIAGNOSIS — Z87891 Personal history of nicotine dependence: Secondary | ICD-10-CM | POA: Diagnosis not present

## 2021-04-18 DIAGNOSIS — R911 Solitary pulmonary nodule: Secondary | ICD-10-CM

## 2021-04-18 LAB — CMP (CANCER CENTER ONLY)
ALT: 25 U/L (ref 0–44)
AST: 16 U/L (ref 15–41)
Albumin: 4 g/dL (ref 3.5–5.0)
Alkaline Phosphatase: 81 U/L (ref 38–126)
Anion gap: 10 (ref 5–15)
BUN: 14 mg/dL (ref 8–23)
CO2: 25 mmol/L (ref 22–32)
Calcium: 9.7 mg/dL (ref 8.9–10.3)
Chloride: 103 mmol/L (ref 98–111)
Creatinine: 0.83 mg/dL (ref 0.44–1.00)
GFR, Estimated: >60 mL/min (ref >60)
Glucose, Bld: 117 mg/dL — ABNORMAL HIGH (ref 70–99)
Potassium: 4 mmol/L (ref 3.5–5.1)
Sodium: 138 mmol/L (ref 135–145)
Total Bilirubin: 0.7 mg/dL (ref 0.3–1.2)
Total Protein: 8 g/dL (ref 6.5–8.1)

## 2021-04-18 LAB — CBC WITH DIFFERENTIAL (CANCER CENTER ONLY)
Abs Immature Granulocytes: 0.02 10*3/uL (ref 0.00–0.07)
Basophils Absolute: 0.1 10*3/uL (ref 0.0–0.1)
Basophils Relative: 1 %
Eosinophils Absolute: 0.3 10*3/uL (ref 0.0–0.5)
Eosinophils Relative: 4 %
HCT: 40.9 % (ref 36.0–46.0)
Hemoglobin: 13.5 g/dL (ref 12.0–15.0)
Immature Granulocytes: 0 %
Lymphocytes Relative: 27 %
Lymphs Abs: 1.8 10*3/uL (ref 0.7–4.0)
MCH: 31.2 pg (ref 26.0–34.0)
MCHC: 33 g/dL (ref 30.0–36.0)
MCV: 94.5 fL (ref 80.0–100.0)
Monocytes Absolute: 0.6 10*3/uL (ref 0.1–1.0)
Monocytes Relative: 8 %
Neutro Abs: 4 10*3/uL (ref 1.7–7.7)
Neutrophils Relative %: 60 %
Platelet Count: 367 10*3/uL (ref 150–400)
RBC: 4.33 MIL/uL (ref 3.87–5.11)
RDW: 12.8 % (ref 11.5–15.5)
WBC Count: 6.7 10*3/uL (ref 4.0–10.5)
nRBC: 0 % (ref 0.0–0.2)

## 2021-04-18 MED ORDER — LINACLOTIDE 145 MCG PO CAPS
145.0000 ug | ORAL_CAPSULE | Freq: Every day | ORAL | 0 refills | Status: DC
  Filled 2021-04-18: qty 30, 30d supply, fill #0

## 2021-04-18 MED ORDER — LINACLOTIDE 145 MCG PO CAPS
145.0000 ug | ORAL_CAPSULE | Freq: Every day | ORAL | 0 refills | Status: DC
  Filled 2021-04-18: qty 10, 10d supply, fill #0

## 2021-04-18 MED ORDER — LINACLOTIDE 145 MCG PO CAPS
145.0000 ug | ORAL_CAPSULE | Freq: Every day | ORAL | 0 refills | Status: DC
  Filled 2021-04-18: qty 5, 5d supply, fill #0

## 2021-04-18 NOTE — Progress Notes (Signed)
[place] [phone]   [phone]  CONSULT NOTE  REFERRING PHYSICIAN: Dr. Melodie Bouillon  REASON FOR CONSULTATION:  60 years old white female recently diagnosed with lung cancer.  HPI Patricia Mathews is a 59 y.o. female with past medical history significant for fibromyalgia, generalized anxiety disorder, panic attacks, hypertension, neuropathy, obstructive sleep apnea, Sjogren's syndrome, systemic lupus erythematosus as well as history of endometrial polyps.  The patient has a known history of pulmonary nodule seen on CT scan of the abdomen on November 01, 2019. Repeat CT scan of the chest on January 17, 2021 showed peripheral right lower lobe 0.9 cm solid pulmonary nodule minimally increased from 0.8 cm on November 01, 2019.  A PET scan was performed on January 27, 2021 and that showed 0.9 x 0.7 cm ovoid nodule in the right lower lobe, none FDG avid and likely benign.  The patient was referred to Dr. Kipp Brood and on Mar 26, 2021 she underwent robotic assisted right video thoracoscopy with right lower lobe wedge resection and mediastinal lymph node sampling.  The final pathology ([id number]) showed carcinoid tumorlet measuring 0.4 cm limited to the lung and the resection margins were negative for malignancy.  C dissected lymph nodes were also negative for malignancy. The patient was referred to me today for evaluation and recommendation regarding her condition. When seen today the patient is feeling fine except for the pain on the right side of the chest with numbness radiating to the front.  She also has severe constipation after treatment with narcotic for her pain.  She tried several over-the-counter pain medication and recently starting using Linzess for few days.  She requested refill of Linzess today.  She continues to have mild cough on and off started after the surgery with mild shortness of breath and fatigue.  She denied having any weight loss.   She has intermittent nausea with no vomiting or diarrhea. Family history significant for father with lung, kidney and prostate cancer.  She has 2 brother with prostate cancer and 1 brother with discoid lupus.  Mother had hypertension. The patient is married and has 2 children a son and daughter.  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  She has a history of smoking for around 5 years but quit in 1995.  She has no history of alcohol or drug abuse.  HPI  Past Medical History:  Diagnosis Date   Endometrial polyp    Fibromyalgia    GAD (generalized anxiety disorder)    History of panic attacks    Hypertension    followed by pcp   Neuropathy    OSA on CPAP    followed by dr dohmeier--- per study 02-02-2018 moderate to severe osa   Sjogren's disease ([place])    Systemic lupus ([place])    rheumonotologist-- dr Page Spiro    Past Surgical History:  Procedure Laterality Date   [place];  1989   COLONOSCOPY WITH PROPOFOL  10/2012   DILATATION & CURETTAGE/HYSTEROSCOPY WITH MYOSURE N/A 11/05/2020   Procedure: [place];  Surgeon: Princess Bruins, MD;  Location: [facility];  Service: Gynecology;  Laterality: N/A;  request to follow in [place] block time ~10:00am requests one hour   INTERCOSTAL NERVE BLOCK Right 03/26/2021   Procedure: INTERCOSTAL NERVE BLOCK;  Surgeon: Lajuana Matte, MD;  Location: [facility];  Service: Thoracic;  Laterality: Right;   LYMPH NODE DISSECTION  03/26/2021   Procedure: LYMPH  NODE DISSECTION;  Surgeon: Lajuana Matte, MD;  Location: [facility] OR;  Service: Thoracic;;    Family History  Problem Relation Age of Onset   Hypertension Mother    Osteoarthritis Mother    Cancer Father        PROSTATE   Hypertension Father    Hypertension Sister    Lupus Sister    Cancer Brother        PROSTATE   Lupus Brother    Hypertension Brother    Sarcoidosis Paternal Aunt    Hypertension Brother     Hypertension Brother    Breast cancer Neg Hx     Social History Social History   Tobacco Use   Smoking status: Former    Years: 10.00    Pack years: 0.00    Types: Cigarettes    Quit date: 1989    Years since quitting: 33.4   Smokeless tobacco: Never  Vaping Use   Vaping Use: Never used  Substance Use Topics   Alcohol use: Yes    Comment: occasional   Drug use: Never    Allergies  Allergen Reactions   Penicillins Anaphylaxis    Current Outpatient Medications  Medication Sig Dispense Refill   acetaminophen (TYLENOL) 500 MG tablet Take 1,000 mg by mouth every 6 (six) hours as needed for moderate pain or headache.     BLACK ELDERBERRY PO Take 1,000 mg by mouth daily.     DULoxetine (CYMBALTA) 30 MG capsule TAKE 1 CAPSULE (30 MG TOTAL) BY MOUTH 2 (TWO) TIMES DAILY. (Patient taking differently: Take 30 mg by mouth See admin instructions. Take 30 mg daily, may take a second 30 mg in the afternoon as needed for pain) 90 capsule 3   HYDROcodone-acetaminophen (NORCO/VICODIN) 5-325 MG tablet Take 1 tablet by mouth every 6 (six) hours as needed for moderate pain. 40 tablet 0   losartan-hydrochlorothiazide (HYZAAR) 50-12.5 MG tablet TAKE 1 TABLET BY MOUTH DAILY. 90 tablet 3   rosuvastatin (CRESTOR) 10 MG tablet TAKE 1 TABLET (10 MG TOTAL) BY MOUTH DAILY. (Patient taking differently: Take 10 mg by mouth daily.) 90 tablet 3   TURMERIC PO Take 1 tablet by mouth daily.     vitamin B-12 (CYANOCOBALAMIN) 1000 MCG tablet Take 1,000 mcg by mouth daily.     VITAMIN D PO Take 1 capsule by mouth daily.     No current facility-administered medications for this visit.    Review of Systems  Constitutional: positive for fatigue Eyes: negative Ears, nose, mouth, throat, and face: negative Respiratory: positive for cough, dyspnea on exertion, and pleurisy/chest pain Cardiovascular: negative Gastrointestinal: positive for constipation and nausea Genitourinary:negative Integument/breast:  negative Hematologic/lymphatic: negative Musculoskeletal:negative Neurological: negative Behavioral/Psych: positive for anxiety Endocrine: negative Allergic/Immunologic: negative  Physical Exam  WPY:KDXIP, healthy, no distress, well nourished, and well developed SKIN: skin color, texture, turgor are normal HEAD: Normocephalic, No masses, lesions, tenderness or abnormalities EYES: normal, PERRLA EARS: External ears normal OROPHARYNX:no exudate and no erythema  NECK: supple, no adenopathy LYMPH:  no palpable lymphadenopathy BREAST:not examined LUNGS: clear to auscultation , and palpation HEART: regular rate & rhythm, no murmurs, and no gallops ABDOMEN:abdomen soft and non-tender BACK: No CVA tenderness, Range of motion is normal EXTREMITIES:no edema  NEURO: alert & oriented x 3 with fluent speech, no focal motor/sensory deficits  PERFORMANCE STATUS: ECOG 1  LABORATORY DATA: Lab Results  Component Value Date   WBC 8.3 03/28/2021   HGB 11.9 (L) 03/28/2021   HCT 37.1 03/28/2021  MCV 98.4 03/28/2021   PLT 301 03/28/2021      Chemistry      Component Value Date/Time   NA 136 03/28/2021 0054   NA 140 10/11/2020 0834   K 4.0 03/28/2021 0054   CL 99 03/28/2021 0054   CO2 30 03/28/2021 0054   BUN 13 03/28/2021 0054   BUN 12 10/11/2020 0834   CREATININE 1.06 (H) 03/28/2021 0054   CREATININE 0.79 01/26/2018 1245      Component Value Date/Time   CALCIUM 8.8 (L) 03/28/2021 0054   ALKPHOS 59 03/28/2021 0054   AST 18 03/28/2021 0054   AST 18 01/26/2018 1245   ALT 26 03/28/2021 0054   ALT 21 01/26/2018 1245   BILITOT 0.6 03/28/2021 0054   BILITOT 0.4 10/11/2020 0834   BILITOT 0.5 01/26/2018 1245       RADIOGRAPHIC STUDIES: DG Chest 2 View  Result Date: 04/03/2021 CLINICAL DATA:  Recent chest surgery EXAM: CHEST - 2 VIEW COMPARISON:  Six days ago FINDINGS: No visible pneumothorax or pleural effusion. Improved lung inflation. Stable heart size and mediastinal contours  IMPRESSION: No evidence of active disease. Electronically Signed   By: Monte Fantasia M.D.   On: 04/03/2021 09:30   DG Chest 2 View  Result Date: 03/25/2021 CLINICAL DATA:  60 year old female with a history of upcoming surgery. History of right lower lobe lung nodule EXAM: CHEST - 2 VIEW COMPARISON:  Chest CT 01/17/2021, PET CT 01/31/2021 FINDINGS: Cardiomediastinal silhouette within normal limits in size and contour. No evidence of central vascular congestion. No pneumothorax or pleural effusion. No confluent airspace disease. Known right lower lobe lung nodule better seen on prior CT and PET CT. No acute displaced fracture. IMPRESSION: Negative for acute cardiopulmonary disease. Electronically Signed   By: Corrie Mckusick D.O.   On: 03/25/2021 08:32   DG CHEST PORT 1 VIEW  Result Date: 03/28/2021 CLINICAL DATA:  Chest tube removal EXAM: PORTABLE CHEST 1 VIEW COMPARISON:  Mar 27, 2021. FINDINGS: Right-sided chest tube has been removed. Minimal right apicolateral pneumothorax, smaller than 1 day prior. Lungs clear. Heart size upper normal with pulmonary vascularity normal. No adenopathy. No bone lesions. IMPRESSION: Chest tube removed on the right with trace residual pneumothorax, smaller than 1 day prior. No edema or airspace opacity. Heart upper normal in size. Electronically Signed   By: Lowella Grip III M.D.   On: 03/28/2021 11:33   DG Chest Port 1 View  Result Date: 03/27/2021 CLINICAL DATA:  Chest tube. EXAM: PORTABLE CHEST 1 VIEW COMPARISON:  03/27/2021. FINDINGS: Right chest tube in stable position. Tiny right apical pneumothorax again noted. Low lung volumes with mild bibasilar atelectasis. Stable cardiomegaly. No acute bony abnormality. IMPRESSION: 1. Right chest tube in stable position. Stable tiny right apical pneumothorax. 2.  Low lung volumes with mild bibasilar atelectasis. 3.  Stable cardiomegaly. Electronically Signed   By: Marcello Moores  Register   On: 03/27/2021 16:03   DG CHEST PORT 1  VIEW  Result Date: 03/27/2021 CLINICAL DATA:  Chest tube placement EXAM: PORTABLE CHEST 1 VIEW COMPARISON:  03/26/2021 FINDINGS: Large bore right chest tube is again seen. Small right apical pneumothorax has developed since prior examination. Minimal left basilar atelectasis or infiltrate. No pneumothorax on the left. No pleural effusion. Cardiac size is within normal limits. No acute bone abnormality. IMPRESSION: Interval development of small right apical pneumothorax. Large bore right chest tube in place. Clinical correlation for appropriate chest tube function is recommended. These results will be called to the  ordering clinician or representative by the Radiologist Assistant, and communication documented in the PACS or [employer]. Electronically Signed   By: Fidela Salisbury MD   On: 03/27/2021 07:00   DG Chest Port 1 View  Result Date: 03/26/2021 CLINICAL DATA:  Pneumothorax status post right lower lobe wedge resection EXAM: PORTABLE CHEST 1 VIEW COMPARISON:  Mar 24, 2021. FINDINGS: The heart size and mediastinal contours are within normal limits. Right-sided chest tube is noted without pneumothorax. Lungs are clear. The visualized skeletal structures are unremarkable. IMPRESSION: Right-sided chest tube without pneumothorax. Electronically Signed   By: Marijo Conception M.D.   On: 03/26/2021 14:48    ASSESSMENT: This is a very pleasant 60 years old female recently diagnosed with a stage IA (T1a, N0, M0) low-grade neuroendocrine carcinoma, typical carcinoid presented with right lower lobe lung nodule status post right lower lobe wedge resection with lymph node sampling on Mar 26, 2021 under the care of Dr. Kipp Brood   PLAN: I had a lengthy discussion with the patient and her husband Dr. Mitchel Honour about her current condition and treatment options. I explained to the patient that she has very small and low-grade neuroendocrine carcinoma.  I also explained to her that there is no survival benefit for  any additional adjuvant chemotherapy or radiation after surgical resection. I recommended for the patient to continue on observation with repeat CT scan of the chest in 1 year. For the constipation, she will continue her current treatment with Linzess and I gave her a refill of this medication. She was also advised to drink half a bottle of milk of magnesia once to try to help with her severe and persistent constipation.  She will stay on the other over-the-counter constipation medication on as-needed basis. The patient was advised to call immediately if she has any other concerning symptoms in the interval. The patient voices understanding of current disease status and treatment options and is in agreement with the current care plan.  All questions were answered. The patient knows to call the clinic with any problems, questions or concerns. We can certainly see the patient much sooner if necessary.  Thank you so much for allowing me to participate in the care of Patricia Mathews. I will continue to follow up the patient with you and assist in her care. The total time spent in the appointment was 60 minutes.  Disclaimer: This note was dictated with voice recognition software. Similar sounding words can inadvertently be transcribed and may not be corrected upon review.   Eilleen Kempf April 18, 2021, 8:51 AM

## 2021-04-21 ENCOUNTER — Ambulatory Visit: Payer: 59 | Admitting: Internal Medicine

## 2021-04-21 ENCOUNTER — Other Ambulatory Visit: Payer: 59

## 2021-04-22 ENCOUNTER — Other Ambulatory Visit: Payer: Self-pay | Admitting: *Deleted

## 2021-04-22 DIAGNOSIS — I4510 Unspecified right bundle-branch block: Secondary | ICD-10-CM

## 2021-04-28 ENCOUNTER — Telehealth: Payer: Self-pay | Admitting: Internal Medicine

## 2021-04-28 ENCOUNTER — Ambulatory Visit: Payer: 59 | Admitting: Thoracic Surgery (Cardiothoracic Vascular Surgery)

## 2021-04-28 NOTE — Telephone Encounter (Signed)
Scheduled per los. Mailed printout  °

## 2021-05-02 ENCOUNTER — Ambulatory Visit: Payer: 59 | Admitting: Thoracic Surgery (Cardiothoracic Vascular Surgery)

## 2021-05-05 ENCOUNTER — Other Ambulatory Visit (HOSPITAL_COMMUNITY): Payer: Self-pay

## 2021-05-05 ENCOUNTER — Other Ambulatory Visit: Payer: Self-pay | Admitting: Registered Nurse

## 2021-05-05 ENCOUNTER — Other Ambulatory Visit: Payer: Self-pay | Admitting: *Deleted

## 2021-05-05 DIAGNOSIS — Z9989 Dependence on other enabling machines and devices: Secondary | ICD-10-CM

## 2021-05-05 DIAGNOSIS — G4733 Obstructive sleep apnea (adult) (pediatric): Secondary | ICD-10-CM

## 2021-05-05 MED ORDER — FLUTICASONE PROPIONATE HFA 110 MCG/ACT IN AERO
2.0000 | INHALATION_SPRAY | Freq: Every day | RESPIRATORY_TRACT | 3 refills | Status: DC
  Filled 2021-05-05: qty 12, 60d supply, fill #0

## 2021-05-05 MED ORDER — QVAR REDIHALER 40 MCG/ACT IN AERB
2.0000 | INHALATION_SPRAY | Freq: Two times a day (BID) | RESPIRATORY_TRACT | 5 refills | Status: DC
  Filled 2021-05-05 (×2): qty 10.6, 30d supply, fill #0

## 2021-05-07 ENCOUNTER — Other Ambulatory Visit: Payer: Self-pay | Admitting: Thoracic Surgery (Cardiothoracic Vascular Surgery)

## 2021-05-07 DIAGNOSIS — C7A090 Malignant carcinoid tumor of the bronchus and lung: Secondary | ICD-10-CM

## 2021-05-09 ENCOUNTER — Other Ambulatory Visit (HOSPITAL_COMMUNITY): Payer: Self-pay

## 2021-05-09 ENCOUNTER — Ambulatory Visit (INDEPENDENT_AMBULATORY_CARE_PROVIDER_SITE_OTHER): Payer: Self-pay | Admitting: Thoracic Surgery (Cardiothoracic Vascular Surgery)

## 2021-05-09 ENCOUNTER — Other Ambulatory Visit: Payer: Self-pay

## 2021-05-09 ENCOUNTER — Ambulatory Visit
Admission: RE | Admit: 2021-05-09 | Discharge: 2021-05-09 | Disposition: A | Discharge status: Home / Self Care | Payer: 59 | Source: Ambulatory Visit | Attending: Thoracic Surgery (Cardiothoracic Vascular Surgery) | Admitting: Thoracic Surgery (Cardiothoracic Vascular Surgery)

## 2021-05-09 ENCOUNTER — Encounter: Payer: Self-pay | Admitting: Thoracic Surgery (Cardiothoracic Vascular Surgery)

## 2021-05-09 VITALS — BP 134/76 | HR 85 | Resp 20 | Ht 65.0 in

## 2021-05-09 DIAGNOSIS — R918 Other nonspecific abnormal finding of lung field: Secondary | ICD-10-CM | POA: Diagnosis not present

## 2021-05-09 DIAGNOSIS — C7A090 Malignant carcinoid tumor of the bronchus and lung: Secondary | ICD-10-CM

## 2021-05-09 DIAGNOSIS — Z9889 Other specified postprocedural states: Secondary | ICD-10-CM

## 2021-05-09 MED FILL — Rosuvastatin Calcium Tab 10 MG: ORAL | 90 days supply | Qty: 90 | Fill #0 | Status: AC

## 2021-05-09 NOTE — Progress Notes (Signed)
[address]       [place] [address]             [phone]        Taquanna E Deprey [facility] Medical Record #[id number] Date of Birth: Mar 31, 1961  Referring: Garner Nash, DO Primary Care: Horald Pollen, MD Primary Cardiologist:None  Reason for visit:   follow-up  History of Present Illness:     60 year old female presents for 1 month follow-up appointment.  She underwent a wedge resection which identified carcinoid tumor.  Overall she is doing well.  She occasionally has an intermittent cough.  She is back to exercising at the gym without much difficulty.  Physical Exam: BP 134/76 (BP Location: Right Arm, Patient Position: Sitting)   Pulse 85   Resp 20   Ht 5' 5" (1.651 m)   SpO2 95% Comment: RA  BMI 36.36 kg/m   Alert NAD Incision clean.   Abdomen ND No peripheral edema   Diagnostic Studies & Laboratory data: CXR: Clear     Assessment / Plan:   60 year old female status post right robotic assisted thoracoscopy with wedge resection.  Pathology was consistent with a carcinoid tumor.  She is already met with medical oncology, and the plan is to follow-up with her and 1 year with cross-sectional imaging.  I will see her back as a virtual visit in 6 months to assess the status of her cough.   Lajuana Matte 05/09/2021 5:18 PM

## 2021-06-02 DIAGNOSIS — D3A090 Benign carcinoid tumor of the bronchus and lung: Secondary | ICD-10-CM | POA: Diagnosis not present

## 2021-06-02 DIAGNOSIS — Z1211 Encounter for screening for malignant neoplasm of colon: Secondary | ICD-10-CM | POA: Diagnosis not present

## 2021-06-02 DIAGNOSIS — I10 Essential (primary) hypertension: Secondary | ICD-10-CM | POA: Diagnosis not present

## 2021-06-17 MED FILL — Losartan Potassium & Hydrochlorothiazide Tab 50-12.5 MG: ORAL | 90 days supply | Qty: 90 | Fill #2 | Status: AC

## 2021-06-18 ENCOUNTER — Other Ambulatory Visit (HOSPITAL_COMMUNITY): Payer: Self-pay

## 2021-06-30 ENCOUNTER — Other Ambulatory Visit (HOSPITAL_COMMUNITY): Payer: Self-pay

## 2021-06-30 MED ORDER — NA SULFATE-K SULFATE-MG SULF 17.5-3.13-1.6 GM/177ML PO SOLN
ORAL | 0 refills | Status: DC
  Filled 2021-06-30: qty 354, 1d supply, fill #0

## 2021-07-04 ENCOUNTER — Other Ambulatory Visit (HOSPITAL_COMMUNITY): Payer: Self-pay

## 2021-07-24 DIAGNOSIS — Z1211 Encounter for screening for malignant neoplasm of colon: Secondary | ICD-10-CM | POA: Diagnosis not present

## 2021-07-24 DIAGNOSIS — Z120 Encounter for screening for malignant neoplasm of stomach: Secondary | ICD-10-CM | POA: Diagnosis not present

## 2021-07-24 DIAGNOSIS — K219 Gastro-esophageal reflux disease without esophagitis: Secondary | ICD-10-CM | POA: Diagnosis not present

## 2021-07-24 DIAGNOSIS — R12 Heartburn: Secondary | ICD-10-CM | POA: Diagnosis not present

## 2021-08-01 ENCOUNTER — Telehealth: Payer: Self-pay

## 2021-08-01 NOTE — Telephone Encounter (Signed)
[facility] called to see if we could provide office notes from the patients last visit however I did not see any.

## 2021-08-08 ENCOUNTER — Ambulatory Visit: Payer: 59 | Admitting: Family Medicine

## 2021-08-08 ENCOUNTER — Ambulatory Visit (INDEPENDENT_AMBULATORY_CARE_PROVIDER_SITE_OTHER): Payer: 59

## 2021-08-08 ENCOUNTER — Other Ambulatory Visit: Payer: Self-pay

## 2021-08-08 ENCOUNTER — Ambulatory Visit: Payer: Self-pay

## 2021-08-08 ENCOUNTER — Ambulatory Visit: Payer: 59 | Admitting: Cardiology

## 2021-08-08 ENCOUNTER — Encounter: Payer: Self-pay | Admitting: *Deleted

## 2021-08-08 VITALS — BP 134/80 | HR 88 | Ht 65.0 in | Wt 220.4 lb

## 2021-08-08 VITALS — BP 142/90 | HR 73 | Ht 65.0 in | Wt 220.0 lb

## 2021-08-08 DIAGNOSIS — M25562 Pain in left knee: Secondary | ICD-10-CM

## 2021-08-08 DIAGNOSIS — I10 Essential (primary) hypertension: Secondary | ICD-10-CM | POA: Diagnosis not present

## 2021-08-08 DIAGNOSIS — I4510 Unspecified right bundle-branch block: Secondary | ICD-10-CM | POA: Diagnosis not present

## 2021-08-08 DIAGNOSIS — E7800 Pure hypercholesterolemia, unspecified: Secondary | ICD-10-CM

## 2021-08-08 NOTE — Patient Instructions (Addendum)
Medication Instructions:  Your physician recommends that you continue on your current medications as directed. Please refer to the Current Medication list given to you today.  *If you need a refill on your cardiac medications before your next appointment, please call your pharmacy*   Lab Work: TODAY: [facility] If you have labs (blood work) drawn today and your tests are completely normal, you will receive your results only by: [facility] (if you have MyChart) OR A paper copy in the mail If you have any lab test that is abnormal or we need to change your treatment, we will call you to review the results.   Testing/Procedures: Your physician has requested that you have an echocardiogram. Echocardiography is a painless test that uses sound waves to create images of your heart. It provides your doctor with information about the size and shape of your heart and how well your heart's chambers and valves are working. This procedure takes approximately one hour. There are no restrictions for this procedure.  Your physician has requested that you have calcium score CT. Cardiac computed tomography (CT) is a painless test that uses an x-ray machine to take clear, detailed pictures of your heart. For further information please visit [email or web address]. Please follow instruction sheet as given.  Your physician has recommended that you wear an event monitor. Event monitors are medical devices that record the heart's electrical activity. Doctors most often [place] these monitors to diagnose arrhythmias. Arrhythmias are problems with the speed or rhythm of the heartbeat. The monitor is a small, portable device. You can wear one while you do your normal daily activities. This is usually used to diagnose what is causing palpitations/syncope (passing out).   Follow-Up: At [facility], you and your health needs are our priority.  As part of our continuing mission to provide you with exceptional heart care,  we have created designated Provider Care Teams.  These Care Teams include your primary Cardiologist (physician) and Advanced Practice Providers (APPs -  Physician Assistants and Nurse Practitioners) who all work together to provide you with the care you need, when you need it.  Follow up with Dr. Radford Pax as needed based on results of testing.   Other Instructions You have been referred to the [facility]   [employer] Cardiac Event Monitor Instructions Your physician has requested you wear your cardiac event monitor for 30 days. [employer] may call or text to confirm a shipping address. The monitor will be sent to a land address via UPS. [employer] will not ship a monitor to a PO BOX. It typically takes 3-5 days to receive your monitor after it has been enrolled. [employer] will assist with USPS tracking if your package is delayed. The telephone number for [employer] is [phone]. Once you have received your monitor, please review the enclosed instructions. Instruction tutorials can also be viewed under help and settings on the enclosed cell phone. Your monitor has already been registered assigning a specific monitor serial # to you.  Applying the monitor Remove cell phone from case and turn it on. The cell phone works as Dealer and needs to be within [employer] of you at all times. The cell phone will need to be charged on a daily basis. We recommend you plug the cell phone into the enclosed charger at your bedside table every night.  Monitor batteries: You will receive two monitor batteries labelled #1 and #2. These are your recorders. Plug battery #2 onto the second connection on the enclosed  Games developer. Keep one battery on the charger at all times. This will keep the monitor battery deactivated. It will also keep it fully charged for when you need to switch your monitor batteries. A small light will be blinking on the battery emblem when it is charging. The  light on the battery emblem will remain on when the battery is fully charged.  Open package of a Monitor strip. Insert battery #1 into black hood on strip and gently squeeze monitor battery onto connection as indicated in instruction booklet. Set aside while preparing skin.  Choose location for your strip, vertical or horizontal, as indicated in the instruction booklet. Shave to remove all hair from location. There cannot be any lotions, oils, powders, or colognes on skin where monitor is to be applied. Wipe skin clean with enclosed Saline wipe. Dry skin completely.  Peel paper labeled #1 off the back of the Monitor strip exposing the adhesive. Place the monitor on the chest in the vertical or horizontal position shown in the instruction booklet. One arrow on the monitor strip must be pointing upward. Carefully remove paper labeled #2, attaching remainder of strip to your skin. Try not to create any folds or wrinkles in the strip as you apply it.  Firmly press and release the circle in the center of the monitor battery. You will hear a small beep. This is turning the monitor battery on. The heart emblem on the monitor battery will light up every 5 seconds if the monitor battery in turned on and connected to the patient securely. Do not push and hold the circle down as this turns the monitor battery off. The cell phone will locate the monitor battery. A screen will appear on the cell phone checking the connection of your monitor strip. This may read poor connection initially but change to good connection within the next minute. Once your monitor accepts the connection you will hear a series of 3 beeps followed by a climbing crescendo of beeps. A screen will appear on the cell phone showing the two monitor strip placement options. Touch the picture that demonstrates where you applied the monitor strip.  Your monitor strip and battery are waterproof. You are able to shower, bathe, or swim with  the monitor on. They just ask you do not submerge deeper than 3 feet underwater. We recommend removing the monitor if you are swimming in a lake, river, or ocean.  Your monitor battery will need to be switched to a fully charged monitor battery approximately once a week. The cell phone will alert you of an action which needs to be made.  On the cell phone, tap for details to reveal connection status, monitor battery status, and cell phone battery status. The green dots indicates your monitor is in good status. A red dot indicates there is something that needs your attention.  To record a symptom, click the circle on the monitor battery. In 30-60 seconds a list of symptoms will appear on the cell phone. Select your symptom and tap save. Your monitor will record a sustained or significant arrhythmia regardless of you clicking the button. Some patients do not feel the heart rhythm irregularities. [employer] will notify us of any serious or critical events.  Refer to instruction booklet for instructions on switching batteries, changing strips, the Do not disturb or Pause features, or any additional questions.  Call [employer] at [phone], to confirm your monitor is transmitting and record your baseline. They will answer any questions you may have regarding  the monitor instructions at that time.  Returning the monitor to [place] all equipment back into blue box. Peel off strip of paper to expose adhesive and close box securely. There is a prepaid UPS shipping label on this box. Drop in a UPS drop box, or at a UPS facility like [employer]. You may also contact [employer] to arrange UPS to pick up monitor package at your home.

## 2021-08-08 NOTE — Progress Notes (Signed)
I, Peterson Lombard, LAT, ATC acting as a scribe for Lynne Leader, MD.  Subjective:    CC: L knee pain  HPI: Pt is a 60 y/o female c/o L knee pain x 3 weeks w/ no known MOI. Pt c/o instability in her L knee.  Pt locates pain to both hamstring tendons as then come into the knee joint, worse medially. Pt c/o pain keeping her up at night.  She denies any injury.  She notes cycling makes the knee hurt worse however she can walk at the gym without pain.  L knee swelling: no Mechanical symptoms: Yes feeling of instability Aggravates: knee flex Treatments tried: elevation, heat, Tylenol, IBU  Pertinent review of Systems: No fevers or chills  Relevant historical information: History lung cancer.  Lupus   Objective:    Vitals:   08/08/21 0930  BP: (!) 142/90  Pulse: 73  SpO2: 98%   General: Well Developed, well nourished, and in no acute distress.   MSK: Left knee normal-appearing with minimal to trace effusion. Normal motion some pain with extension. Tender palpation posterior medial knee near distal hamstring. Mildly tender palpation posterior lateral near distal hamstring. Mildly tender palpation medial joint line. Intact strength to extension and flexion. Stable ligamentous exam. Significantly positive medial Murray's test   Lab and Radiology Results  Procedure: Real-time Ultrasound Guided Injection of left knee superior lateral patellar space Device: Philips Affiniti 50G Images permanently stored and available for review in PACS Ultrasound evaluation prior to injection reveals normal-appearing quad tendon with trace joint effusion superior patellar space.  Normal patellar tendon.  Mildly narrowed medial joint line without clear medial meniscus tear.  Normal lateral joint line. No Baker's cyst posterior knee. Trace hypoechoic fluid tracks medial hamstring tendon distally. Verbal informed consent obtained.  Discussed risks and benefits of procedure. Warned about infection  bleeding damage to structures skin hypopigmentation and fat atrophy among others. Patient expresses understanding and agreement Time-out conducted.   Noted no overlying erythema, induration, or other signs of local infection.   Skin prepped in a sterile fashion.   Local anesthesia: Topical Ethyl chloride.   With sterile technique and under real time ultrasound guidance: 40 mg of Kenalog and 2 mL of lidocaine injected into knee joint. Fluid seen entering the joint capsule.   Completed without difficulty   Pain immediately resolved suggesting accurate placement of the medication.   Advised to call if fevers/chills, erythema, induration, drainage, or persistent bleeding.   Images permanently stored and available for review in the ultrasound unit.  Impression: Technically successful ultrasound guided injection.   X-ray images left knee obtained today personally and independently interpreted Mild medial compartment DJD.  No acute fractures.  No aggressive appearing bony lesions. Await formal radiology review     Impression and Recommendations:    Assessment and Plan: 60 y.o. female with left knee pain.  Etiology is somewhat unclear at this time however I think most likely explanation is of posterior degenerative medial meniscus tear.  She may also have hamstring tendinopathy as well.  Plan for steroid injection and referral to physical therapy.  Also recommend Voltaren gel.  Check back in 6 weeks.  If not improving or if worsening next step would be MRI.  Would do MRI early given her mechanical symptoms and her history of cancer.  PDMP not reviewed this encounter. Orders Placed This Encounter  Procedures   [place] LIMITED JOINT SPACE STRUCTURES LOW LEFT(NO LINKED CHARGES)    Standing Status:   Future  Number of Occurrences:   1    Standing Expiration Date:   02/05/2022    Order Specific Question:   Reason for Exam (SYMPTOM  OR DIAGNOSIS REQUIRED)    Answer:   left knee pain    Order  Specific Question:   Preferred imaging location?    Answer:   [place]   DG Knee AP/LAT W/[place] Left    Standing Status:   Future    Number of Occurrences:   1    Standing Expiration Date:   08/08/2022    Order Specific Question:   Reason for Exam (SYMPTOM  OR DIAGNOSIS REQUIRED)    Answer:   left knee pain    Order Specific Question:   Preferred imaging location?    Answer:   [facility]   Ambulatory referral to Physical Therapy    Referral Priority:   Routine    Referral Type:   Physical Medicine    Referral Reason:   Specialty Services Required    Requested Specialty:   Physical Therapy    Number of Visits Requested:   1   No orders of the defined types were placed in this encounter.   Discussed warning signs or symptoms. Please see discharge instructions. Patient expresses understanding.   The above documentation has been reviewed and is accurate and complete Lynne Leader, M.D.

## 2021-08-08 NOTE — Progress Notes (Signed)
Cardiology CONSULT Note    Date:  08/08/2021   ID:  Patricia Mathews, DOB [birth year], MRN [id number]  PCP:  Horald Pollen, MD  Cardiologist:  Fransico Him, MD   Chief Complaint  Patient presents with   New Patient (Initial Visit)    RBBB, HTN, HLD     History of Present Illness:  Patricia Mathews is a 60 y.o. female who is being seen today for the evaluation of New RBBB at the request of Tunisha, Ruland, *.  This is a 60yo female with a hx of HTN, GAD and panic attacks, OSA on CPAP, SLE with Sjogren's disease and fibromyalgia who is referred by Dr. Mitchel Honour for evaluation of new RBBB.  She recently underwent wedge resection of her right lung with pathology showing carcinoid stage 1A.  She is following with oncology She also has a fm hix of cardiac amyloidosis.  Her father had mitral valve repair.  Her brother has discoid lupus and 2 brothers with prostate CA an sister with breast Ca.    She tells me that lately she will have some skipped beats lasting 5-10 seconds and resolves.  She goes up and down lots of stairs daily without any fatigue, CP or DOE.  She walks 2 miles with no sx.   She denies any PND, orthopnea or syncope.  She has some dizziness at times with the palpitations.  She uses CPAP nightly and sleeps well.  She does have a remote hx of tobacco use but no fm hx of CAD. She had a coronary Ca score in 2016 that was normal.  Her last EKG that showed no RBBB was 2014 and then showed up in 10/2020 at time of preop EKG for a uterine mass.    Past Medical History:  Diagnosis Date   Endometrial polyp    Fibromyalgia    GAD (generalized anxiety disorder)    History of panic attacks    Hypertension    followed by pcp   Neuropathy    OSA on CPAP    followed by dr dohmeier--- per study 02-02-2018 moderate to severe osa   Sjogren's disease ([place])    Systemic lupus ([place])    rheumonotologist-- dr Page Spiro    Past Surgical History:  Procedure Laterality Date    [place];  1989   COLONOSCOPY WITH PROPOFOL  10/2012   DILATATION & CURETTAGE/HYSTEROSCOPY WITH MYOSURE N/A 11/05/2020   Procedure: [place];  Surgeon: Princess Bruins, MD;  Location: [facility];  Service: Gynecology;  Laterality: N/A;  request to follow in [place] block time ~10:00am requests one hour   INTERCOSTAL NERVE BLOCK Right 03/26/2021   Procedure: INTERCOSTAL NERVE BLOCK;  Surgeon: Lajuana Matte, MD;  Location: [facility];  Service: Thoracic;  Laterality: Right;   LYMPH NODE DISSECTION  03/26/2021   Procedure: LYMPH NODE DISSECTION;  Surgeon: Lajuana Matte, MD;  Location: [facility];  Service: Thoracic;;    Current Medications: Current Meds  Medication Sig   ALPRAZolam (XANAX) 0.25 MG tablet Take 0.25 mg by mouth at bedtime as needed for anxiety.   BLACK ELDERBERRY PO Take 1,000 mg by mouth daily.   DULoxetine (CYMBALTA) 30 MG capsule TAKE 1 CAPSULE (30 MG TOTAL) BY MOUTH 2 (TWO) TIMES DAILY. (Patient taking differently: Take 30 mg by mouth See admin instructions. Take 30 mg daily, may take a second 30 mg in the afternoon as needed for pain)  losartan-hydrochlorothiazide (HYZAAR) 50-12.5 MG tablet TAKE 1 TABLET BY MOUTH DAILY.   Respiratory Therapy Supplies (CARETOUCH 2 CPAP HOSE HANGER) MISC by Does not apply route.   rosuvastatin (CRESTOR) 10 MG tablet TAKE 1 TABLET (10 MG TOTAL) BY MOUTH DAILY. (Patient taking differently: Take 10 mg by mouth daily.)   TURMERIC PO Take 1 tablet by mouth daily.   vitamin B-12 (CYANOCOBALAMIN) 1000 MCG tablet Take 1,000 mcg by mouth daily.   VITAMIN D PO Take 1 capsule by mouth daily.    Allergies:   Penicillins   Social History   Socioeconomic History   Marital status: Married    Spouse name: Not on file   Number of children: 2   Years of education: post-grad   Highest education level: Not on file  Occupational History   Not on file  Tobacco Use    Smoking status: Former    Years: 10.00    Types: Cigarettes    Quit date: 1989    Years since quitting: 33.7   Smokeless tobacco: Never  Vaping Use   Vaping Use: Never used  Substance and Sexual Activity   Alcohol use: Yes    Comment: occasional   Drug use: Never   Sexual activity: Yes    Partners: Male    Birth control/protection: Post-menopausal    Comment: 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25, PARTNERS- 3, MARRIED- 63 YRS   Other Topics Concern   Not on file  Social History Narrative   Lives at home with her husband   Right handed   2 cups of caffeine daily   Social Determinants of Health   Financial Resource Strain: Not on file  Food Insecurity: Not on file  Transportation Needs: Not on file  Physical Activity: Not on file  Stress: Not on file  Social Connections: Not on file     Family History:  The patient's family history includes Cancer in her brother and father; Hypertension in her brother, brother, brother, father, mother, and sister; Lupus in her brother and sister; Osteoarthritis in her mother; Sarcoidosis in her paternal aunt.   ROS:   Please see the history of present illness.    ROS All other systems reviewed and are negative.  No flowsheet data found.     PHYSICAL EXAM:   VS:  BP 134/80   Pulse 88   Ht 5\' 5"  (1.651 m)   Wt 220 lb 6.4 oz (100 kg)   SpO2 96%   BMI 36.68 kg/m    GEN: Well nourished, well developed, in no acute distress  HEENT: normal  Neck: no JVD, carotid bruits, or masses Cardiac: RRR; no murmurs, rubs, or gallops,no edema.  Intact distal pulses bilaterally.  Respiratory:  clear to auscultation bilaterally, normal work of breathing GI: soft, nontender, nondistended, + BS MS: no deformity or atrophy  Skin: warm and dry, no rash Neuro:  Alert and Oriented x 3, Strength and sensation are intact Psych: euthymic mood, full affect  Wt Readings from Last 3 Encounters:  08/08/21 220 lb 6.4 oz (100 kg)  04/18/21 218 lb 8 oz (99.1 kg)  04/03/21  215 lb (97.5 kg)      Studies/Labs Reviewed:   EKG:  EKG is ordered today.  The ekg ordered demonstrates NSR with RBBB with LVH by voltage in May 2022  Recent Labs: 10/11/2020: TSH 1.150 04/18/2021: ALT 25; BUN 14; Creatinine 0.83; Hemoglobin 13.5; Platelet Count 367; Potassium 4.0; Sodium 138   Lipid Panel    Component Value Date/Time  CHOL 235 (H) 10/11/2020 0834   TRIG 97 10/11/2020 0834   HDL 79 10/11/2020 0834   LDLCALC 139 (H) 10/11/2020 5885     Additional studies/ records that were reviewed today include:  none    ASSESSMENT:    1. RBBB   2. Essential hypertension, benign   3. Pure hypercholesterolemia      PLAN:  In order of problems listed above:  RBBB -this is new and she is completely asymptomatic -she has CRF including HTN, HLD, remote tobacco use and autoimmune disorder -I will get a 2D echo to assess LVF, rule out PFO that can be seen in RBBB -repeat coronary Ca score  2.  HTN -BP is controlled on exam today -continue prescription drug management with Losartan HCT 50-12.5mg  daily  3.  HLD -LDL goal < 100 -I have personally reviewed and interpreted outside labs performed by patient's PCP which showed LDL 139, HDL 79, TAG 97 in Dec 2021 -repeat FLP with a lipomed panel to get LPa level  -continue Crestor 10mg  daily  4.  Palpitations -will get an event mointor to assess for arrhythmias  Time Spent: 25 minutes total time of encounter, including 15 minutes spent in face-to-face patient care on the date of this encounter. This time includes coordination of care and counseling regarding above mentioned problem list. Remainder of non-face-to-face time involved reviewing chart documents/testing relevant to the patient encounter and documentation in the medical record. I have independently reviewed documentation from referring provider  Medication Adjustments/Labs and Tests Ordered: Current medicines are reviewed at length with the patient today.   Concerns regarding medicines are outlined above.  Medication changes, Labs and Tests ordered today are listed in the Patient Instructions below.  There are no Patient Instructions on file for this visit.   Signed, Fransico Him, MD  08/08/2021 8:06 AM    [facility] [address], [place], [place]  [address] Phone: [phone]; Fax: [phone]

## 2021-08-08 NOTE — Addendum Note (Signed)
Addended by: Antonieta Iba on: 08/08/2021 08:41 AM   Modules accepted: Orders

## 2021-08-08 NOTE — Progress Notes (Unsigned)
Patient ID: Patricia Mathews, female   DOB: [birth year], 60 y.o.   MRN: [id number] Patient enrolled for [employer] to ship a 30 day cardiac event monitor to her address on file.

## 2021-08-08 NOTE — Patient Instructions (Addendum)
Thank you for coming in today.   You received an injection today. Seek immediate medical attention if the joint becomes red, extremely painful, or is oozing fluid.   I've referred you to Physical Therapy.  Let us know if you don't hear from them in one week.   Please use Voltaren gel (Generic Diclofenac Gel) up to 4x daily for pain as needed.  This is available over-the-counter as both the name brand Voltaren gel and the generic diclofenac gel.   Recheck back in 6 weeks

## 2021-08-09 LAB — NMR, LIPOPROFILE
Cholesterol, Total: 178 mg/dL (ref 100–199)
HDL Particle Number: 45.7 umol/L (ref >=30.5)
HDL-C: 81 mg/dL (ref >39)
LDL Particle Number: 983 nmol/L (ref <1000)
LDL Size: 20.9 nm (ref >20.5)
LDL-C (NIH Calc): 85 mg/dL (ref 0–99)
LP-IR Score: 31 (ref <=45)
Small LDL Particle Number: 477 nmol/L (ref <=527)
Triglycerides: 65 mg/dL (ref 0–149)

## 2021-08-11 DIAGNOSIS — Z0289 Encounter for other administrative examinations: Secondary | ICD-10-CM

## 2021-08-11 NOTE — Progress Notes (Signed)
Left knee x-ray does not show any acute findings.  No fractures or dislocation.  No severe arthritis.

## 2021-08-12 ENCOUNTER — Other Ambulatory Visit: Payer: Self-pay | Admitting: Emergency Medicine

## 2021-08-17 MED FILL — Rosuvastatin Calcium Tab 10 MG: ORAL | 90 days supply | Qty: 90 | Fill #1 | Status: AC

## 2021-08-17 MED FILL — Duloxetine HCl Enteric Coated Pellets Cap 30 MG (Base Eq): ORAL | 45 days supply | Qty: 90 | Fill #1 | Status: AC

## 2021-08-18 ENCOUNTER — Other Ambulatory Visit (HOSPITAL_COMMUNITY): Payer: Self-pay

## 2021-08-22 ENCOUNTER — Telehealth: Payer: Self-pay | Admitting: *Deleted

## 2021-08-22 ENCOUNTER — Other Ambulatory Visit: Payer: Self-pay | Admitting: Cardiology

## 2021-08-22 DIAGNOSIS — E7800 Pure hypercholesterolemia, unspecified: Secondary | ICD-10-CM

## 2021-08-22 DIAGNOSIS — I4510 Unspecified right bundle-branch block: Secondary | ICD-10-CM

## 2021-08-22 DIAGNOSIS — I10 Essential (primary) hypertension: Secondary | ICD-10-CM

## 2021-08-22 NOTE — Telephone Encounter (Signed)
-----   Message from Livingston Diones, [place] sent at 08/22/2021  8:40 AM EDT ----- Regarding: Diagnosis Code Needed Can someone please add a diagnosis code onto this patients scheduled Calcium Score please?  Thanks!

## 2021-08-22 NOTE — Telephone Encounter (Signed)
New Calcium Score order placed for this pt, per CT dept request.  Order placed and will make CT Scheduler aware of this, so she can arrange this appointment for the pt.

## 2021-08-29 ENCOUNTER — Ambulatory Visit (INDEPENDENT_AMBULATORY_CARE_PROVIDER_SITE_OTHER): Payer: 59

## 2021-08-29 ENCOUNTER — Other Ambulatory Visit: Payer: Self-pay

## 2021-08-29 ENCOUNTER — Ambulatory Visit (INDEPENDENT_AMBULATORY_CARE_PROVIDER_SITE_OTHER)
Admission: RE | Admit: 2021-08-29 | Discharge: 2021-08-29 | Disposition: A | Discharge status: Home / Self Care | Payer: Self-pay | Source: Ambulatory Visit | Attending: Cardiology | Admitting: Cardiology

## 2021-08-29 ENCOUNTER — Ambulatory Visit (HOSPITAL_COMMUNITY): Payer: 59 | Attending: Cardiology

## 2021-08-29 DIAGNOSIS — R42 Dizziness and giddiness: Secondary | ICD-10-CM | POA: Diagnosis not present

## 2021-08-29 DIAGNOSIS — I4510 Unspecified right bundle-branch block: Secondary | ICD-10-CM

## 2021-08-29 DIAGNOSIS — E7800 Pure hypercholesterolemia, unspecified: Secondary | ICD-10-CM

## 2021-08-29 DIAGNOSIS — I10 Essential (primary) hypertension: Secondary | ICD-10-CM | POA: Diagnosis not present

## 2021-08-29 DIAGNOSIS — R002 Palpitations: Secondary | ICD-10-CM

## 2021-08-29 LAB — ECHOCARDIOGRAM COMPLETE
Area-P 1/2: 2.83 cm2
S' Lateral: 2.5 cm

## 2021-09-01 ENCOUNTER — Other Ambulatory Visit: Payer: Self-pay

## 2021-09-01 ENCOUNTER — Ambulatory Visit (INDEPENDENT_AMBULATORY_CARE_PROVIDER_SITE_OTHER): Payer: 59 | Admitting: Physical Therapy

## 2021-09-01 DIAGNOSIS — M25562 Pain in left knee: Secondary | ICD-10-CM | POA: Diagnosis not present

## 2021-09-01 NOTE — Patient Instructions (Signed)
Access Code: [email or web address] URL: [email or web address] Date: 09/01/2021 Prepared by: Lyndee Hensen  Exercises Seated Hamstring Stretch - 2 x daily - 3 reps - 30 hold Seated Knee Extension AROM - 1 x daily - 2 sets - 10 reps Straight Leg Raise - 1 x daily - 2 sets - 10 reps

## 2021-09-05 ENCOUNTER — Other Ambulatory Visit: Payer: Self-pay

## 2021-09-05 ENCOUNTER — Ambulatory Visit (INDEPENDENT_AMBULATORY_CARE_PROVIDER_SITE_OTHER): Payer: 59 | Admitting: Thoracic Surgery (Cardiothoracic Vascular Surgery)

## 2021-09-05 ENCOUNTER — Encounter: Payer: Self-pay | Admitting: *Deleted

## 2021-09-05 ENCOUNTER — Other Ambulatory Visit: Payer: Self-pay | Admitting: Thoracic Surgery (Cardiothoracic Vascular Surgery)

## 2021-09-05 ENCOUNTER — Other Ambulatory Visit: Payer: Self-pay | Admitting: *Deleted

## 2021-09-05 VITALS — BP 145/78 | HR 100 | Resp 20 | Ht 65.0 in

## 2021-09-05 DIAGNOSIS — C7A090 Malignant carcinoid tumor of the bronchus and lung: Secondary | ICD-10-CM

## 2021-09-05 DIAGNOSIS — Z9889 Other specified postprocedural states: Secondary | ICD-10-CM | POA: Diagnosis not present

## 2021-09-05 DIAGNOSIS — D3A00 Benign carcinoid tumor of unspecified site: Secondary | ICD-10-CM

## 2021-09-05 NOTE — Progress Notes (Signed)
      [address]       [place] [address]             [phone]        Keya E Medley [facility] Medical Record #[id number] Date of Birth: [birth year]  Referring: Garner Nash, DO Primary Care: Horald Pollen, MD Primary Cardiologist:Traci Radford Pax, MD  Reason for visit:   follow-up  History of Present Illness:     60 year old female returns in follow-up.  She was originally seen in April of this year for an a 9 mm right lower lobe pulmonary nodule.  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  She had an uncomplicated postoperative recovery.  She underwent a calcium scoring CT which showed that the original tumor was still present despite obtaining a diagnosis.  She comes in today to talk about surgical planning to have the tumor removed.  Physical Exam: BP (!) 145/78   Pulse 100   Resp 20   Ht 5\' 5"  (1.651 m)   SpO2 95% Comment: RA  BMI 36.61 kg/m   Alert NAD Incision clean, well-healed.   Abdomen   ND extremities are warm.   Diagnostic Studies & Laboratory data: CT chest: Staple line in the right lower lobe is evident.  However the 9 mm right lower lobe pulmonary nodule remains present and unchanged.     Assessment / Plan:   60 year old female who underwent a right robotic assisted thoracoscopy with wedge resection of the lower lobe which revealed carcinoid tumorlets.  When she was taken to surgery, the plan was originally to resect a 9 mm right lower lobe pulmonary nodule.  This was missed at the time of surgery, however we did obtain a diagnosis of a carcinoid tumor light.  It is very likely that this 9 mm nodule is also a carcinoid tumor.  The patient states that she would like this removed, and we have arranged for her to undergo a navigational marking with Dr. Valeta Harms, prior to resection.  She is tentatively scheduled for the late next week which she  will also undergo redo robotic assisted right lower lobe wedge resection.   Lajuana Matte 09/05/2021 6:32 PM

## 2021-09-05 NOTE — H&P (View-Only) (Signed)
      [address]       [place] [address]             [phone]        Coretha E Kimple [facility] Medical Record #[id number] Date of Birth: Dec 17, 1960  Referring: Garner Nash, DO Primary Care: Horald Pollen, MD Primary Cardiologist:Traci Radford Pax, MD  Reason for visit:   follow-up  History of Present Illness:     60 year old female returns in follow-up.  She was originally seen in April of this year for an a 9 mm right lower lobe pulmonary nodule.  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  She had an uncomplicated postoperative recovery.  She underwent a calcium scoring CT which showed that the original tumor was still present despite obtaining a diagnosis.  She comes in today to talk about surgical planning to have the tumor removed.  Physical Exam: BP (!) 145/78   Pulse 100   Resp 20   Ht 5\' 5"  (1.651 m)   SpO2 95% Comment: RA  BMI 36.61 kg/m   Alert NAD Incision clean, well-healed.   Abdomen   ND extremities are warm.   Diagnostic Studies & Laboratory data: CT chest: Staple line in the right lower lobe is evident.  However the 9 mm right lower lobe pulmonary nodule remains present and unchanged.     Assessment / Plan:   60 year old female who underwent a right robotic assisted thoracoscopy with wedge resection of the lower lobe which revealed carcinoid tumorlets.  When she was taken to surgery, the plan was originally to resect a 9 mm right lower lobe pulmonary nodule.  This was missed at the time of surgery, however we did obtain a diagnosis of a carcinoid tumor light.  It is very likely that this 9 mm nodule is also a carcinoid tumor.  The patient states that she would like this removed, and we have arranged for her to undergo a navigational marking with Dr. Valeta Harms, prior to resection.  She is tentatively scheduled for the late next week which she  will also undergo redo robotic assisted right lower lobe wedge resection.   Lajuana Matte 09/05/2021 6:32 PM

## 2021-09-08 NOTE — Pre-Procedure Instructions (Addendum)
Surgical Instructions    Your procedure is scheduled on Thursday 09/11/21.   Report to [facility] Main Entrance "A" at 05:30 A.M., then check in with the Admitting office.  Call this number if you have problems the morning of surgery:  [phone]   If you have any questions prior to your surgery date call [phone]: Open Monday-Friday 8am-4pm    Remember:  Do not eat or drink after midnight the night before your surgery   Take these medicines the morning of surgery with A SIP OF WATER   DULoxetine (CYMBALTA)  rosuvastatin (CRESTOR)     acetaminophen (TYLENOL)- If needed  ALPRAZolam Duanne Moron)- If needed     As of today, STOP taking any Aspirin (unless otherwise instructed by your surgeon) Aleve, Naproxen, Ibuprofen, Motrin, Advil, Goody's, BC's, all herbal medications, fish oil, and all vitamins.     After your COVID test   You are not required to quarantine however you are required to wear a well-fitting mask when you are out and around people not in your household.  If your mask becomes wet or soiled, replace with a new one.  Wash your hands often with soap and water for 20 seconds or clean your hands with an alcohol-based hand sanitizer that contains at least 60% alcohol.  Do not share personal items.  Notify your provider: if you are in close contact with someone who has COVID  or if you develop a fever of 100.4 or greater, sneezing, cough, sore throat, shortness of breath or body aches.             Do not wear jewelry or makeup Do not wear lotions, powders, perfumes/colognes, or deodorant. Do not shave 48 hours prior to surgery.  Men may shave face and neck. Do not bring valuables to the hospital. DO Not wear nail polish, gel polish, artificial nails, or any other type of covering on natural nails including finger and toenails. If patients have artificial nails, gel coating, etc. that need to be removed by a nail salon, please have this removed prior to surgery  or surgery may need to be canceled/delayed if the surgeon/ anesthesia feels like the patient is unable to be adequately monitored.             [facility] is not responsible for any belongings or valuables.  Do NOT Smoke (Tobacco/Vaping)  24 hours prior to your procedure  If you use a CPAP at night, you may bring your mask for your overnight stay.   Contacts, glasses, hearing aids, dentures or partials may not be worn into surgery, please bring cases for these belongings   For patients admitted to the hospital, discharge time will be determined by your treatment team.   Patients discharged the day of surgery will not be allowed to drive home, and someone needs to stay with them for 24 hours.  NO VISITORS WILL BE ALLOWED IN PRE-OP WHERE PATIENTS ARE PREPPED FOR SURGERY.  ONLY 1 SUPPORT PERSON MAY BE PRESENT IN THE WAITING ROOM WHILE YOU ARE IN SURGERY.  IF YOU ARE TO BE ADMITTED, ONCE YOU ARE IN YOUR ROOM YOU WILL BE ALLOWED TWO (2) VISITORS. 1 (ONE) VISITOR MAY STAY OVERNIGHT BUT MUST ARRIVE TO THE ROOM BY 8pm.  Minor children may have two parents present. Special consideration for safety and communication needs will be reviewed on a case by case basis.  Special instructions:    Oral Hygiene is also important to reduce your risk of infection.  Remember - BRUSH YOUR TEETH THE MORNING OF SURGERY WITH YOUR REGULAR TOOTHPASTE   [facility]- Preparing For Surgery  Before surgery, you can play an important role. Because skin is not sterile, your skin needs to be as free of germs as possible. You can reduce the number of germs on your skin by washing with CHG (chlorahexidine gluconate) Soap before surgery.  CHG is an antiseptic cleaner which kills germs and bonds with the skin to continue killing germs even after washing.     Please do not use if you have an allergy to CHG or antibacterial soaps. If your skin becomes reddened/irritated stop using the CHG.  Do not shave (including legs and  underarms) for at least 48 hours prior to first CHG shower. It is OK to shave your face.  Please follow these instructions carefully.     Shower the NIGHT BEFORE SURGERY and the MORNING OF SURGERY with CHG Soap.   If you chose to wash your hair, wash your hair first as usual with your normal shampoo. After you shampoo, rinse your hair and body thoroughly to remove the shampoo.  Then [employer] and genitals (private parts) with your normal soap and rinse thoroughly to remove soap.  After that Use CHG Soap as you would any other liquid soap. You can apply CHG directly to the skin and wash gently with a scrungie or a clean washcloth.   Apply the CHG Soap to your body ONLY FROM THE NECK DOWN.  Do not use on open wounds or open sores. Avoid contact with your eyes, ears, mouth and genitals (private parts). Wash Face and genitals (private parts)  with your normal soap.   Wash thoroughly, paying special attention to the area where your surgery will be performed.  Thoroughly rinse your body with warm water from the neck down.  DO NOT shower/wash with your normal soap after using and rinsing off the CHG Soap.  Pat yourself dry with a CLEAN TOWEL.  Wear CLEAN PAJAMAS to bed the night before surgery  Place CLEAN SHEETS on your bed the night before your surgery  DO NOT SLEEP WITH PETS.   Day of Surgery:  Take a shower with CHG soap. Wear Clean/Comfortable clothing the morning of surgery Do not apply any deodorants/lotions.   Remember to brush your teeth WITH YOUR REGULAR TOOTHPASTE.   Please read over the following fact sheets that you were given.

## 2021-09-09 ENCOUNTER — Ambulatory Visit (HOSPITAL_COMMUNITY)
Admission: RE | Admit: 2021-09-09 | Discharge: 2021-09-09 | Disposition: A | Discharge status: Home / Self Care | Payer: 59 | Source: Ambulatory Visit | Attending: Thoracic Surgery (Cardiothoracic Vascular Surgery) | Admitting: Thoracic Surgery (Cardiothoracic Vascular Surgery)

## 2021-09-09 ENCOUNTER — Telehealth: Payer: Self-pay | Admitting: Pulmonary Disease

## 2021-09-09 ENCOUNTER — Other Ambulatory Visit: Payer: Self-pay

## 2021-09-09 ENCOUNTER — Ambulatory Visit (INDEPENDENT_AMBULATORY_CARE_PROVIDER_SITE_OTHER): Payer: 59 | Admitting: Family Medicine

## 2021-09-09 ENCOUNTER — Encounter (HOSPITAL_COMMUNITY)
Admission: RE | Admit: 2021-09-09 | Discharge: 2021-09-09 | Disposition: A | Discharge status: Home / Self Care | Payer: 59 | Source: Ambulatory Visit | Attending: Thoracic Surgery (Cardiothoracic Vascular Surgery) | Admitting: Thoracic Surgery (Cardiothoracic Vascular Surgery)

## 2021-09-09 ENCOUNTER — Encounter (HOSPITAL_COMMUNITY): Payer: Self-pay

## 2021-09-09 VITALS — BP 150/90 | HR 79 | Temp 98.1°F | Resp 19 | Ht 65.0 in | Wt 219.9 lb

## 2021-09-09 DIAGNOSIS — D3A00 Benign carcinoid tumor of unspecified site: Secondary | ICD-10-CM | POA: Insufficient documentation

## 2021-09-09 DIAGNOSIS — C7A090 Malignant carcinoid tumor of the bronchus and lung: Secondary | ICD-10-CM | POA: Insufficient documentation

## 2021-09-09 DIAGNOSIS — G4733 Obstructive sleep apnea (adult) (pediatric): Secondary | ICD-10-CM | POA: Diagnosis not present

## 2021-09-09 DIAGNOSIS — R911 Solitary pulmonary nodule: Secondary | ICD-10-CM | POA: Diagnosis not present

## 2021-09-09 DIAGNOSIS — Z01818 Encounter for other preprocedural examination: Secondary | ICD-10-CM | POA: Insufficient documentation

## 2021-09-09 DIAGNOSIS — I4510 Unspecified right bundle-branch block: Secondary | ICD-10-CM | POA: Insufficient documentation

## 2021-09-09 DIAGNOSIS — I10 Essential (primary) hypertension: Secondary | ICD-10-CM | POA: Diagnosis not present

## 2021-09-09 DIAGNOSIS — Z4682 Encounter for fitting and adjustment of non-vascular catheter: Secondary | ICD-10-CM | POA: Diagnosis not present

## 2021-09-09 DIAGNOSIS — Z20822 Contact with and (suspected) exposure to covid-19: Secondary | ICD-10-CM | POA: Insufficient documentation

## 2021-09-09 DIAGNOSIS — Z87891 Personal history of nicotine dependence: Secondary | ICD-10-CM | POA: Diagnosis not present

## 2021-09-09 DIAGNOSIS — Z85110 Personal history of malignant carcinoid tumor of bronchus and lung: Secondary | ICD-10-CM | POA: Diagnosis not present

## 2021-09-09 DIAGNOSIS — J9811 Atelectasis: Secondary | ICD-10-CM | POA: Diagnosis not present

## 2021-09-09 DIAGNOSIS — F411 Generalized anxiety disorder: Secondary | ICD-10-CM | POA: Diagnosis not present

## 2021-09-09 DIAGNOSIS — I517 Cardiomegaly: Secondary | ICD-10-CM | POA: Diagnosis not present

## 2021-09-09 HISTORY — DX: Unspecified right bundle-branch block: I45.10

## 2021-09-09 HISTORY — DX: Family history of other specified conditions: Z84.89

## 2021-09-09 HISTORY — DX: Cardiac arrhythmia, unspecified: I49.9

## 2021-09-09 HISTORY — DX: Other specified health status: Z78.9

## 2021-09-09 HISTORY — DX: Dyspnea, unspecified: R06.00

## 2021-09-09 LAB — URINALYSIS, ROUTINE W REFLEX MICROSCOPIC
Bacteria, UA: NONE SEEN
Bilirubin Urine: NEGATIVE
Glucose, UA: NEGATIVE mg/dL
Ketones, ur: NEGATIVE mg/dL
Leukocytes,Ua: NEGATIVE
Nitrite: NEGATIVE
Protein, ur: NEGATIVE mg/dL
Specific Gravity, Urine: 1.024 (ref 1.005–1.030)
pH: 5 (ref 5.0–8.0)

## 2021-09-09 LAB — CBC
HCT: 41 % (ref 36.0–46.0)
Hemoglobin: 13.5 g/dL (ref 12.0–15.0)
MCH: 31 pg (ref 26.0–34.0)
MCHC: 32.9 g/dL (ref 30.0–36.0)
MCV: 94.3 fL (ref 80.0–100.0)
Platelets: 321 10*3/uL (ref 150–400)
RBC: 4.35 MIL/uL (ref 3.87–5.11)
RDW: 13.2 % (ref 11.5–15.5)
WBC: 8.2 10*3/uL (ref 4.0–10.5)
nRBC: 0 % (ref 0.0–0.2)

## 2021-09-09 LAB — COMPREHENSIVE METABOLIC PANEL
ALT: 22 U/L (ref 0–44)
AST: 17 U/L (ref 15–41)
Albumin: 3.8 g/dL (ref 3.5–5.0)
Alkaline Phosphatase: 65 U/L (ref 38–126)
Anion gap: 9 (ref 5–15)
BUN: 18 mg/dL (ref 8–23)
CO2: 24 mmol/L (ref 22–32)
Calcium: 9.4 mg/dL (ref 8.9–10.3)
Chloride: 103 mmol/L (ref 98–111)
Creatinine, Ser: 0.65 mg/dL (ref 0.44–1.00)
GFR, Estimated: >60 mL/min (ref >60)
Glucose, Bld: 108 mg/dL — ABNORMAL HIGH (ref 70–99)
Potassium: 4.2 mmol/L (ref 3.5–5.1)
Sodium: 136 mmol/L (ref 135–145)
Total Bilirubin: 0.5 mg/dL (ref 0.3–1.2)
Total Protein: 7.2 g/dL (ref 6.5–8.1)

## 2021-09-09 LAB — BLOOD GAS, ARTERIAL
Acid-Base Excess: 2.8 mmol/L — ABNORMAL HIGH (ref 0.0–2.0)
Bicarbonate: 27.1 mmol/L (ref 20.0–28.0)
Drawn by: 602861
FIO2: 21
O2 Saturation: 97.2 %
Patient temperature: 37
pCO2 arterial: 43.5 mmHg (ref 32.0–48.0)
pH, Arterial: 7.41 (ref 7.350–7.450)
pO2, Arterial: 94.2 mmHg (ref 83.0–108.0)

## 2021-09-09 LAB — TYPE AND SCREEN
ABO/RH(D): O POS
Antibody Screen: NEGATIVE

## 2021-09-09 LAB — SURGICAL PCR SCREEN
MRSA, PCR: NEGATIVE
Staphylococcus aureus: NEGATIVE

## 2021-09-09 LAB — PROTIME-INR
INR: 0.9 (ref 0.8–1.2)
Prothrombin Time: 12.2 seconds (ref 11.4–15.2)

## 2021-09-09 LAB — APTT: aPTT: 26 seconds (ref 24–36)

## 2021-09-09 LAB — SARS CORONAVIRUS 2 (TAT 6-24 HRS): SARS Coronavirus 2: NEGATIVE

## 2021-09-09 NOTE — Telephone Encounter (Signed)
CPT [phone]  Called x3 and got voicemail 3 times. Will hold in triage.

## 2021-09-09 NOTE — Progress Notes (Signed)
PCP - Dr. Agustina Caroli Cardiologist - Dr. Fransico Him Oncologist- Dr. Curt Bears  PPM/ICD - n/a   Chest x-ray - 09/09/21 EKG - 09/09/21 Stress Test - denies ECHO - 08/29/21 Cardiac Cath - denies  Sleep Study - OSA+ CPAP - Wears nightly  Fasting Blood Sugar - n/a  Blood Thinner Instructions: n/a Aspirin Instructions: n/a  ERAS Protcol - No  COVID TEST- 09/09/21. Pending.    Anesthesia review: Yes.   Patient denies shortness of breath, fever, cough and chest pain at PAT appointment   All instructions explained to the patient, with a verbal understanding of the material. Patient agrees to go over the instructions while at home for a better understanding. Patient also instructed to self quarantine after being tested for COVID-19. The opportunity to ask questions was provided.

## 2021-09-10 NOTE — Anesthesia Preprocedure Evaluation (Addendum)
Anesthesia Evaluation  Patient identified by MRN, date of birth, ID band Patient awake    Reviewed: Allergy & Precautions, NPO status , Patient's Chart, lab work & pertinent test results  Airway Mallampati: II  TM Distance: >3 FB Neck ROM: Full    Dental no notable dental hx.    Pulmonary sleep apnea and Continuous Positive Airway Pressure Ventilation , former smoker,    Pulmonary exam normal breath sounds clear to auscultation       Cardiovascular hypertension, Pt. on medications Normal cardiovascular exam Rhythm:Regular Rate:Normal  ECG: rate 74  ECHO: Left ventricular ejection fraction, by estimation, is 60 to 65%. The left ventricle has normal function. The left ventricle has no regional wall motion abnormalities. Left ventricular diastolic parameters are consistent with Grade I diastolic dysfunction (impaired relaxation). Right ventricular systolic function is normal. The right ventricular size is normal. The mitral valve is normal in structure. Trivial mitral valve regurgitation. No evidence of mitral stenosis. The aortic valve is tricuspid. Aortic valve regurgitation is not visualized. Mild aortic valve sclerosis is present, with no evidence of aortic valve stenosis. The inferior vena cava is normal in size with greater than 50% respiratory variability, suggesting right atrial pressure of 3 mmHg.   Neuro/Psych  Headaches, PSYCHIATRIC DISORDERS Anxiety    GI/Hepatic negative GI ROS, Neg liver ROS,   Endo/Other  negative endocrine ROS  Renal/GU negative Renal ROS     Musculoskeletal  (+) Fibromyalgia -  Abdominal (+) + obese,   Peds  Hematology HLD   Anesthesia Other Findings lung mass  Reproductive/Obstetrics                            Anesthesia Physical Anesthesia Plan  ASA: 3  Anesthesia Plan: General   Post-op Pain Management:    Induction: Intravenous  PONV Risk Score and  Plan: 3 and Ondansetron, Dexamethasone, Midazolam and Treatment may vary due to age or medical condition  Airway Management Planned: Oral ETT and Double Lumen EBT  Additional Equipment:   Intra-op Plan:   Post-operative Plan: Extubation in OR  Informed Consent: I have reviewed the patients History and Physical, chart, labs and discussed the procedure including the risks, benefits and alternatives for the proposed anesthesia with the patient or authorized representative who has indicated his/her understanding and acceptance.     Dental advisory given  Plan Discussed with: CRNA  Anesthesia Plan Comments: ([facility]  PAT note by Karoline Caldwell, PA-C:  Patient recently underwent robotic assisted thoracoscopy with wedge resection of a peripheral lesion on 03/26/2021 that turned out to be a carcinoid tumorlet.  She had an uneventful recovery.  Recent calcium scoring CT showed persistence of original tumor.  She is now scheduled for navigational marking and redo robotic assisted right lower lobe wedge resection.  Recently evaluated by cardiologist Dr. Radford Pax for new right bundle branch block on EKG and palpitations.  Echo was ordered which showed normal LVEF, grade 1 diastolic dysfunction, no significant valvular abnormalities.  Coronary CT showed calcium score of 0.  Event monitor has been ordered but not yet completed.  OSA on CPAP.  History of Sjogren's and lupus followed by rheumatologist Dr. Trudie Reed.  Preop labs reviewed, unremarkable.  EKG 09/09/2021: Normal sinus rhythm.  Rate 74.  LAD.  RBBB. Minimal voltage criteria for LVH, may be normal variant ( R in aVL ).  No significant change.  Chest 2 view 09/09/2021: COMPARISON: 05/09/2021 chest radiograph.  FINDINGS: Stable cardiomediastinal  silhouette with normal heart size. No pneumothorax. No pleural effusion. Right lower lobe 9 mm pulmonary nodule, unchanged. No pulmonary edema. No acute consolidative airspace  disease.  IMPRESSION: 1. No acute cardiopulmonary disease. 2. Stable known right lower lobe 9 mm pulmonary nodule.   Cardiac CT 08/29/2021: IMPRESSION: 1. Coronary calcium score of 0. This was 1st percentile for age, gender, and race matched controls.  TTE 08/29/2021: 1. Left ventricular ejection fraction, by estimation, is 60 to 65%. The  left ventricle has normal function. The left ventricle has no regional  wall motion abnormalities. Left ventricular diastolic parameters are  consistent with Grade I diastolic  dysfunction (impaired relaxation).  2. Right ventricular systolic function is normal. The right ventricular  size is normal.  3. The mitral valve is normal in structure. Trivial mitral valve  regurgitation. No evidence of mitral stenosis.  4. The aortic valve is tricuspid. Aortic valve regurgitation is not  visualized. Mild aortic valve sclerosis is present, with no evidence of  aortic valve stenosis.  5. The inferior vena cava is normal in size with greater than 50%  respiratory variability, suggesting right atrial pressure of 3 mmHg.    )       Anesthesia Quick Evaluation

## 2021-09-10 NOTE — Progress Notes (Signed)
Anesthesia Chart Review:  Patient recently underwent robotic assisted thoracoscopy with wedge resection of a peripheral lesion on 03/26/2021 that turned out to be a carcinoid tumorlet.  She had an uneventful recovery.  Recent calcium scoring CT showed persistence of original tumor.  She is now scheduled for navigational marking and redo robotic assisted right lower lobe wedge resection.  Recently evaluated by cardiologist Dr. Radford Pax for new right bundle branch block on EKG and palpitations.  Echo was ordered which showed normal LVEF, grade 1 diastolic dysfunction, no significant valvular abnormalities.  Coronary CT showed calcium score of 0.  Event monitor has been ordered but not yet completed.  OSA on CPAP.  History of Sjogren's and lupus followed by rheumatologist Dr. Trudie Reed.  Preop labs reviewed, unremarkable.  EKG 09/09/2021: Normal sinus rhythm.  Rate 74.  LAD.  RBBB. Minimal voltage criteria for LVH, may be normal variant ( R in aVL ).  No significant change.  Chest 2 view 09/09/2021: COMPARISON:  05/09/2021 chest radiograph.   FINDINGS: Stable cardiomediastinal silhouette with normal heart size. No pneumothorax. No pleural effusion. Right lower lobe 9 mm pulmonary nodule, unchanged. No pulmonary edema. No acute consolidative airspace disease.   IMPRESSION: 1. No acute cardiopulmonary disease. 2. Stable known right lower lobe 9 mm pulmonary nodule.    Cardiac CT 08/29/2021: IMPRESSION: 1. Coronary calcium score of 0. This was 1st percentile for age, gender, and race matched controls.  TTE 08/29/2021:  1. Left ventricular ejection fraction, by estimation, is 60 to 65%. The  left ventricle has normal function. The left ventricle has no regional  wall motion abnormalities. Left ventricular diastolic parameters are  consistent with Grade I diastolic  dysfunction (impaired relaxation).   2. Right ventricular systolic function is normal. The right ventricular  size is normal.    3. The mitral valve is normal in structure. Trivial mitral valve  regurgitation. No evidence of mitral stenosis.   4. The aortic valve is tricuspid. Aortic valve regurgitation is not  visualized. Mild aortic valve sclerosis is present, with no evidence of  aortic valve stenosis.   5. The inferior vena cava is normal in size with greater than 50%  respiratory variability, suggesting right atrial pressure of 3 mmHg.     Patricia Mathews [facility] Short Stay Center/Anesthesiology Phone [phone] 09/10/2021 10:06 AM

## 2021-09-10 NOTE — Telephone Encounter (Signed)
[facility] [place] or anyone at the Pre-Service center but no one answered. I left a message for anyone to return the call so that we can provide them with the CPT code.   Will leave message in triage for follow up.

## 2021-09-11 ENCOUNTER — Inpatient Hospital Stay (HOSPITAL_COMMUNITY)
Admission: RE | Admit: 2021-09-11 | Discharge: 2021-09-12 | DRG: 165 | Disposition: A | Discharge status: Home / Self Care | Payer: 59 | Attending: Thoracic Surgery (Cardiothoracic Vascular Surgery) | Admitting: Thoracic Surgery (Cardiothoracic Vascular Surgery)

## 2021-09-11 ENCOUNTER — Other Ambulatory Visit: Payer: Self-pay

## 2021-09-11 ENCOUNTER — Encounter (HOSPITAL_COMMUNITY)
Admission: RE | Disposition: A | Discharge status: Home / Self Care | Payer: Self-pay | Source: Home / Self Care | Attending: Thoracic Surgery (Cardiothoracic Vascular Surgery)

## 2021-09-11 ENCOUNTER — Ambulatory Visit (HOSPITAL_COMMUNITY): Payer: 59

## 2021-09-11 ENCOUNTER — Observation Stay (HOSPITAL_COMMUNITY): Payer: 59

## 2021-09-11 ENCOUNTER — Encounter (HOSPITAL_COMMUNITY): Payer: Self-pay | Admitting: Pulmonary Disease

## 2021-09-11 ENCOUNTER — Ambulatory Visit (HOSPITAL_COMMUNITY): Payer: 59 | Admitting: Physician Assistant

## 2021-09-11 ENCOUNTER — Ambulatory Visit (HOSPITAL_COMMUNITY): Payer: 59 | Admitting: Anesthesiology

## 2021-09-11 DIAGNOSIS — C7A090 Malignant carcinoid tumor of the bronchus and lung: Secondary | ICD-10-CM | POA: Diagnosis not present

## 2021-09-11 DIAGNOSIS — Z87891 Personal history of nicotine dependence: Secondary | ICD-10-CM

## 2021-09-11 DIAGNOSIS — F411 Generalized anxiety disorder: Secondary | ICD-10-CM | POA: Diagnosis not present

## 2021-09-11 DIAGNOSIS — Z4682 Encounter for fitting and adjustment of non-vascular catheter: Secondary | ICD-10-CM

## 2021-09-11 DIAGNOSIS — I4510 Unspecified right bundle-branch block: Secondary | ICD-10-CM | POA: Diagnosis present

## 2021-09-11 DIAGNOSIS — R911 Solitary pulmonary nodule: Secondary | ICD-10-CM

## 2021-09-11 DIAGNOSIS — G4733 Obstructive sleep apnea (adult) (pediatric): Secondary | ICD-10-CM | POA: Diagnosis present

## 2021-09-11 DIAGNOSIS — Z902 Acquired absence of lung [part of]: Secondary | ICD-10-CM

## 2021-09-11 DIAGNOSIS — D3A00 Benign carcinoid tumor of unspecified site: Secondary | ICD-10-CM

## 2021-09-11 DIAGNOSIS — Z419 Encounter for procedure for purposes other than remedying health state, unspecified: Secondary | ICD-10-CM

## 2021-09-11 DIAGNOSIS — J9811 Atelectasis: Secondary | ICD-10-CM | POA: Diagnosis not present

## 2021-09-11 DIAGNOSIS — Z20822 Contact with and (suspected) exposure to covid-19: Secondary | ICD-10-CM | POA: Diagnosis present

## 2021-09-11 DIAGNOSIS — I10 Essential (primary) hypertension: Secondary | ICD-10-CM | POA: Diagnosis not present

## 2021-09-11 DIAGNOSIS — Z85110 Personal history of malignant carcinoid tumor of bronchus and lung: Secondary | ICD-10-CM

## 2021-09-11 HISTORY — PX: FIDUCIAL MARKER PLACEMENT: SHX6858

## 2021-09-11 HISTORY — PX: LYMPH NODE DISSECTION: SHX5087

## 2021-09-11 HISTORY — PX: VIDEO BRONCHOSCOPY WITH ENDOBRONCHIAL NAVIGATION: SHX6175

## 2021-09-11 HISTORY — PX: INTERCOSTAL NERVE BLOCK: SHX5021

## 2021-09-11 SURGERY — WEDGE RESECTION, LUNG, ROBOT-ASSISTED, THORACOSCOPIC
Anesthesia: General | Site: Chest | Laterality: Right

## 2021-09-11 SURGERY — VIDEO BRONCHOSCOPY WITH ENDOBRONCHIAL NAVIGATION
Anesthesia: General

## 2021-09-11 MED ORDER — ORAL CARE MOUTH RINSE
15.0000 mL | Freq: Two times a day (BID) | OROMUCOSAL | Status: DC
  Administered 2021-09-12: 15 mL via OROMUCOSAL

## 2021-09-11 MED ORDER — KETOROLAC TROMETHAMINE 30 MG/ML IJ SOLN
30.0000 mg | Freq: Once | INTRAMUSCULAR | Status: AC
  Administered 2021-09-11: 30 mg via INTRAVENOUS

## 2021-09-11 MED ORDER — LIDOCAINE 2% (20 MG/ML) 5 ML SYRINGE
INTRAMUSCULAR | Status: DC | PRN
  Administered 2021-09-11: 60 mg via INTRAVENOUS

## 2021-09-11 MED ORDER — PROPOFOL 10 MG/ML IV BOLUS
INTRAVENOUS | Status: AC
  Filled 2021-09-11: qty 20

## 2021-09-11 MED ORDER — ONDANSETRON HCL 4 MG/2ML IJ SOLN
INTRAMUSCULAR | Status: DC | PRN
  Administered 2021-09-11: 4 mg via INTRAVENOUS

## 2021-09-11 MED ORDER — CHLORHEXIDINE GLUCONATE 0.12 % MT SOLN: 15.0000 mL | Freq: Once | OROMUCOSAL | Status: AC

## 2021-09-11 MED ORDER — OXYCODONE HCL 5 MG/5ML PO SOLN: 5.0000 mg | Freq: Once | ORAL | Status: DC | PRN

## 2021-09-11 MED ORDER — FENTANYL CITRATE (PF) 250 MCG/5ML IJ SOLN
INTRAMUSCULAR | Status: AC
  Filled 2021-09-11: qty 5

## 2021-09-11 MED ORDER — MORPHINE SULFATE 1 MG/ML IV SOLN PCA
INTRAVENOUS | Status: DC
  Administered 2021-09-11: 5 mg via INTRAVENOUS
  Administered 2021-09-11: 14 mg via INTRAVENOUS
  Administered 2021-09-12: 8 mg via INTRAVENOUS
  Filled 2021-09-11 (×2): qty 30

## 2021-09-11 MED ORDER — ACETAMINOPHEN 160 MG/5ML PO SOLN: 1000.0000 mg | Freq: Four times a day (QID) | ORAL | Status: DC

## 2021-09-11 MED ORDER — BUPIVACAINE LIPOSOME 1.3 % IJ SUSP
INTRAMUSCULAR | Status: DC | PRN
  Administered 2021-09-11: 100 mL

## 2021-09-11 MED ORDER — LACTATED RINGERS IV SOLN: INTRAVENOUS | Status: DC

## 2021-09-11 MED ORDER — ROCURONIUM BROMIDE 10 MG/ML (PF) SYRINGE
PREFILLED_SYRINGE | INTRAVENOUS | Status: AC
  Filled 2021-09-11: qty 10

## 2021-09-11 MED ORDER — MIDAZOLAM HCL 2 MG/2ML IJ SOLN
INTRAMUSCULAR | Status: DC | PRN
  Administered 2021-09-11: 2 mg via INTRAVENOUS

## 2021-09-11 MED ORDER — HYDROCHLOROTHIAZIDE 12.5 MG PO TABS
12.5000 mg | ORAL_TABLET | Freq: Every day | ORAL | Status: DC
  Administered 2021-09-12: 12.5 mg via ORAL
  Filled 2021-09-11: qty 1

## 2021-09-11 MED ORDER — DULOXETINE HCL 30 MG PO CPEP
30.0000 mg | ORAL_CAPSULE | Freq: Every day | ORAL | Status: DC
  Administered 2021-09-12: 30 mg via ORAL
  Filled 2021-09-11: qty 1

## 2021-09-11 MED ORDER — ONDANSETRON HCL 4 MG/2ML IJ SOLN
INTRAMUSCULAR | Status: AC
  Filled 2021-09-11: qty 2

## 2021-09-11 MED ORDER — FENTANYL CITRATE (PF) 100 MCG/2ML IJ SOLN
25.0000 ug | INTRAMUSCULAR | Status: DC | PRN
  Administered 2021-09-11: 50 ug via INTRAVENOUS

## 2021-09-11 MED ORDER — MIDAZOLAM HCL 2 MG/2ML IJ SOLN
INTRAMUSCULAR | Status: AC
  Filled 2021-09-11: qty 2

## 2021-09-11 MED ORDER — KETOROLAC TROMETHAMINE 15 MG/ML IJ SOLN
15.0000 mg | Freq: Four times a day (QID) | INTRAMUSCULAR | Status: DC
  Administered 2021-09-11 – 2021-09-12 (×3): 15 mg via INTRAVENOUS
  Filled 2021-09-11 (×3): qty 1

## 2021-09-11 MED ORDER — PHENYLEPHRINE 40 MCG/ML (10ML) SYRINGE FOR IV PUSH (FOR BLOOD PRESSURE SUPPORT)
PREFILLED_SYRINGE | INTRAVENOUS | Status: DC | PRN
  Administered 2021-09-11: 40 ug via INTRAVENOUS
  Administered 2021-09-11 (×3): 80 ug via INTRAVENOUS

## 2021-09-11 MED ORDER — FENTANYL CITRATE (PF) 100 MCG/2ML IJ SOLN
INTRAMUSCULAR | Status: AC
  Filled 2021-09-11: qty 2

## 2021-09-11 MED ORDER — ROCURONIUM BROMIDE 10 MG/ML (PF) SYRINGE
PREFILLED_SYRINGE | INTRAVENOUS | Status: DC | PRN
  Administered 2021-09-11: 40 mg via INTRAVENOUS
  Administered 2021-09-11: 60 mg via INTRAVENOUS
  Administered 2021-09-11: 30 mg via INTRAVENOUS

## 2021-09-11 MED ORDER — DEXAMETHASONE SODIUM PHOSPHATE 10 MG/ML IJ SOLN
INTRAMUSCULAR | Status: DC | PRN
  Administered 2021-09-11: 10 mg via INTRAVENOUS

## 2021-09-11 MED ORDER — BISACODYL 5 MG PO TBEC
10.0000 mg | DELAYED_RELEASE_TABLET | Freq: Every day | ORAL | Status: DC
  Administered 2021-09-12: 10 mg via ORAL
  Filled 2021-09-11: qty 2

## 2021-09-11 MED ORDER — CHLORHEXIDINE GLUCONATE 0.12 % MT SOLN
OROMUCOSAL | Status: AC
  Administered 2021-09-11: 15 mL via OROMUCOSAL
  Filled 2021-09-11: qty 15

## 2021-09-11 MED ORDER — DIPHENHYDRAMINE HCL 50 MG/ML IJ SOLN: 12.5000 mg | Freq: Four times a day (QID) | INTRAMUSCULAR | Status: DC | PRN

## 2021-09-11 MED ORDER — METHYLENE BLUE 0.5 % INJ SOLN
INTRAVENOUS | Status: DC | PRN
  Administered 2021-09-11: 1 mL

## 2021-09-11 MED ORDER — PHENYLEPHRINE HCL-NACL 20-0.9 MG/250ML-% IV SOLN
INTRAVENOUS | Status: DC | PRN
  Administered 2021-09-11: 40 ug/min via INTRAVENOUS

## 2021-09-11 MED ORDER — LOSARTAN POTASSIUM-HCTZ 50-12.5 MG PO TABS: 1.0000 | ORAL_TABLET | Freq: Every day | ORAL | Status: DC

## 2021-09-11 MED ORDER — BUPIVACAINE LIPOSOME 1.3 % IJ SUSP
INTRAMUSCULAR | Status: AC
  Filled 2021-09-11: qty 20

## 2021-09-11 MED ORDER — SENNOSIDES-DOCUSATE SODIUM 8.6-50 MG PO TABS: 1.0000 | ORAL_TABLET | Freq: Every day | ORAL | Status: DC

## 2021-09-11 MED ORDER — ALPRAZOLAM 0.5 MG PO TABS: 0.2500 mg | ORAL_TABLET | Freq: Two times a day (BID) | ORAL | Status: DC | PRN

## 2021-09-11 MED ORDER — ENOXAPARIN SODIUM 40 MG/0.4ML IJ SOSY
40.0000 mg | PREFILLED_SYRINGE | Freq: Every day | INTRAMUSCULAR | Status: DC
  Administered 2021-09-12: 40 mg via SUBCUTANEOUS
  Filled 2021-09-11: qty 0.4

## 2021-09-11 MED ORDER — FENTANYL CITRATE (PF) 250 MCG/5ML IJ SOLN
INTRAMUSCULAR | Status: DC | PRN
  Administered 2021-09-11 (×2): 50 ug via INTRAVENOUS
  Administered 2021-09-11: 150 ug via INTRAVENOUS
  Administered 2021-09-11 (×3): 50 ug via INTRAVENOUS

## 2021-09-11 MED ORDER — ROSUVASTATIN CALCIUM 5 MG PO TABS
10.0000 mg | ORAL_TABLET | Freq: Every day | ORAL | Status: DC
  Administered 2021-09-12: 10 mg via ORAL
  Filled 2021-09-11 (×2): qty 2

## 2021-09-11 MED ORDER — TRAMADOL HCL 50 MG PO TABS
50.0000 mg | ORAL_TABLET | Freq: Four times a day (QID) | ORAL | Status: DC | PRN
  Administered 2021-09-11 – 2021-09-12 (×2): 100 mg via ORAL
  Filled 2021-09-11 (×2): qty 2

## 2021-09-11 MED ORDER — 0.9 % SODIUM CHLORIDE (POUR BTL) OPTIME
TOPICAL | Status: DC | PRN
  Administered 2021-09-11: 2000 mL

## 2021-09-11 MED ORDER — AMISULPRIDE (ANTIEMETIC) 5 MG/2ML IV SOLN
10.0000 mg | Freq: Once | INTRAVENOUS | Status: AC | PRN
  Administered 2021-09-11: 10 mg via INTRAVENOUS

## 2021-09-11 MED ORDER — ACETAMINOPHEN 500 MG PO TABS
1000.0000 mg | ORAL_TABLET | Freq: Four times a day (QID) | ORAL | Status: DC
  Administered 2021-09-11 – 2021-09-12 (×3): 1000 mg via ORAL
  Filled 2021-09-11 (×3): qty 2

## 2021-09-11 MED ORDER — ONDANSETRON HCL 4 MG/2ML IJ SOLN: 4.0000 mg | Freq: Four times a day (QID) | INTRAMUSCULAR | Status: DC | PRN

## 2021-09-11 MED ORDER — LUNG SURGERY BOOK
Freq: Once | Status: AC
Start: 2021-09-11 — End: 2021-09-11
  Filled 2021-09-11: qty 1

## 2021-09-11 MED ORDER — VANCOMYCIN HCL IN DEXTROSE 1-5 GM/200ML-% IV SOLN
1000.0000 mg | INTRAVENOUS | Status: AC
  Administered 2021-09-11: 1000 mg via INTRAVENOUS

## 2021-09-11 MED ORDER — LACTATED RINGERS IV SOLN: INTRAVENOUS | Status: DC | PRN

## 2021-09-11 MED ORDER — KETOROLAC TROMETHAMINE 30 MG/ML IJ SOLN
INTRAMUSCULAR | Status: AC
  Filled 2021-09-11: qty 1

## 2021-09-11 MED ORDER — ACETAMINOPHEN 10 MG/ML IV SOLN
1000.0000 mg | Freq: Once | INTRAVENOUS | Status: DC | PRN
  Administered 2021-09-11: 1000 mg via INTRAVENOUS

## 2021-09-11 MED ORDER — SUGAMMADEX SODIUM 200 MG/2ML IV SOLN
INTRAVENOUS | Status: DC | PRN
  Administered 2021-09-11: 200 mg via INTRAVENOUS

## 2021-09-11 MED ORDER — OXYCODONE HCL 5 MG PO TABS: 5.0000 mg | ORAL_TABLET | Freq: Once | ORAL | Status: DC | PRN

## 2021-09-11 MED ORDER — VANCOMYCIN HCL IN DEXTROSE 1-5 GM/200ML-% IV SOLN
1000.0000 mg | Freq: Two times a day (BID) | INTRAVENOUS | Status: AC
  Administered 2021-09-11: 1000 mg via INTRAVENOUS
  Filled 2021-09-11: qty 200

## 2021-09-11 MED ORDER — PROMETHAZINE HCL 25 MG/ML IJ SOLN: 6.2500 mg | INTRAMUSCULAR | Status: DC | PRN

## 2021-09-11 MED ORDER — DIPHENHYDRAMINE HCL 12.5 MG/5ML PO ELIX: 12.5000 mg | ORAL_SOLUTION | Freq: Four times a day (QID) | ORAL | Status: DC | PRN

## 2021-09-11 MED ORDER — LIDOCAINE 2% (20 MG/ML) 5 ML SYRINGE
INTRAMUSCULAR | Status: AC
  Filled 2021-09-11: qty 5

## 2021-09-11 MED ORDER — SODIUM CHLORIDE 0.9% FLUSH: 9.0000 mL | INTRAVENOUS | Status: DC | PRN

## 2021-09-11 MED ORDER — ORAL CARE MOUTH RINSE: 15.0000 mL | Freq: Once | OROMUCOSAL | Status: AC

## 2021-09-11 MED ORDER — ACETAMINOPHEN 10 MG/ML IV SOLN
INTRAVENOUS | Status: AC
  Filled 2021-09-11: qty 100

## 2021-09-11 MED ORDER — PROPOFOL 10 MG/ML IV BOLUS
INTRAVENOUS | Status: DC | PRN
  Administered 2021-09-11: 50 mg via INTRAVENOUS
  Administered 2021-09-11: 20 mg via INTRAVENOUS
  Administered 2021-09-11: 30 mg via INTRAVENOUS
  Administered 2021-09-11: 150 mg via INTRAVENOUS
  Administered 2021-09-11 (×3): 50 mg via INTRAVENOUS

## 2021-09-11 MED ORDER — PHENYLEPHRINE 40 MCG/ML (10ML) SYRINGE FOR IV PUSH (FOR BLOOD PRESSURE SUPPORT)
PREFILLED_SYRINGE | INTRAVENOUS | Status: AC
  Filled 2021-09-11: qty 10

## 2021-09-11 MED ORDER — NALOXONE HCL 0.4 MG/ML IJ SOLN: 0.4000 mg | INTRAMUSCULAR | Status: DC | PRN

## 2021-09-11 MED ORDER — VANCOMYCIN HCL IN DEXTROSE 1-5 GM/200ML-% IV SOLN
INTRAVENOUS | Status: AC
  Filled 2021-09-11: qty 200

## 2021-09-11 MED ORDER — AMISULPRIDE (ANTIEMETIC) 5 MG/2ML IV SOLN
INTRAVENOUS | Status: AC
  Filled 2021-09-11: qty 4

## 2021-09-11 MED ORDER — BUPIVACAINE HCL (PF) 0.5 % IJ SOLN
INTRAMUSCULAR | Status: AC
  Filled 2021-09-11: qty 30

## 2021-09-11 MED ORDER — LOSARTAN POTASSIUM 50 MG PO TABS
50.0000 mg | ORAL_TABLET | Freq: Every day | ORAL | Status: DC
  Administered 2021-09-12: 50 mg via ORAL
  Filled 2021-09-11: qty 1

## 2021-09-11 SURGICAL SUPPLY — 100 items
BLADE CLIPPER SURG (BLADE) ×2 IMPLANT
BNDG COHESIVE 6X5 TAN STRL LF (GAUZE/BANDAGES/DRESSINGS) ×2 IMPLANT
CANISTER SUCT 3000ML PPV (MISCELLANEOUS) ×4 IMPLANT
CANNULA REDUC XI 12-8 STAPL (CANNULA) ×2
CANNULA REDUCER 12-8 DVNC XI (CANNULA) ×2 IMPLANT
CATH TROCAR 20FR (CATHETERS) IMPLANT
CHLORAPREP W/TINT 26 (MISCELLANEOUS) ×2 IMPLANT
CLIP VESOCCLUDE MED 6/CT (CLIP) IMPLANT
CNTNR URN SCR LID CUP LEK RST (MISCELLANEOUS) ×6 IMPLANT
CONN ST 1/4X3/8  BEN (MISCELLANEOUS)
CONN ST 1/4X3/8 BEN (MISCELLANEOUS) IMPLANT
CONT SPEC 4OZ STRL OR WHT (MISCELLANEOUS) ×6
DEFOGGER SCOPE WARMER CLEARIFY (MISCELLANEOUS) ×2 IMPLANT
DERMABOND ADVANCED (GAUZE/BANDAGES/DRESSINGS) ×1
DERMABOND ADVANCED .7 DNX12 (GAUZE/BANDAGES/DRESSINGS) ×1 IMPLANT
DRAIN CHANNEL 19F RND (DRAIN) ×2 IMPLANT
DRAIN CHANNEL 28F RND 3/8 FF (WOUND CARE) IMPLANT
DRAIN CHANNEL 32F RND 10.7 FF (WOUND CARE) IMPLANT
DRAIN CONNECTOR BLAKE 1:1 (MISCELLANEOUS) ×2 IMPLANT
DRAPE ARM DVNC X/XI (DISPOSABLE) ×4 IMPLANT
DRAPE COLUMN DVNC XI (DISPOSABLE) ×1 IMPLANT
DRAPE CV SPLIT W-CLR ANES SCRN (DRAPES) ×2 IMPLANT
DRAPE DA VINCI XI ARM (DISPOSABLE) ×4
DRAPE DA VINCI XI COLUMN (DISPOSABLE) ×1
DRAPE HALF SHEET 40X57 (DRAPES) ×2 IMPLANT
DRAPE ORTHO SPLIT 77X108 STRL (DRAPES) ×1
DRAPE SURG ORHT 6 SPLT 77X108 (DRAPES) ×1 IMPLANT
ELECT BLADE 6.5 EXT (BLADE) IMPLANT
ELECT REM PT RETURN 9FT ADLT (ELECTROSURGICAL) ×2
ELECTRODE REM PT RTRN 9FT ADLT (ELECTROSURGICAL) ×1 IMPLANT
GAUZE 4X4 16PLY ~~LOC~~+RFID DBL (SPONGE) ×2 IMPLANT
GAUZE KITTNER 4X5 RF (MISCELLANEOUS) ×2 IMPLANT
GAUZE SPONGE 4X4 12PLY STRL (GAUZE/BANDAGES/DRESSINGS) ×2 IMPLANT
GLOVE SURG ENC MOIS LTX SZ7.5 (GLOVE) ×4 IMPLANT
GOWN STRL REUS W/ TWL LRG LVL3 (GOWN DISPOSABLE) ×2 IMPLANT
GOWN STRL REUS W/ TWL XL LVL3 (GOWN DISPOSABLE) ×3 IMPLANT
GOWN STRL REUS W/TWL 2XL LVL3 (GOWN DISPOSABLE) ×2 IMPLANT
GOWN STRL REUS W/TWL LRG LVL3 (GOWN DISPOSABLE) ×2
GOWN STRL REUS W/TWL XL LVL3 (GOWN DISPOSABLE) ×3
HEMOSTAT SURGICEL 2X14 (HEMOSTASIS) ×4 IMPLANT
KIT BASIN OR (CUSTOM PROCEDURE TRAY) ×2 IMPLANT
KIT SUCTION CATH 14FR (SUCTIONS) IMPLANT
KIT TURNOVER KIT B (KITS) ×2 IMPLANT
LOOP VESSEL SUPERMAXI WHITE (MISCELLANEOUS) IMPLANT
NEEDLE 22X1 1/2 (OR ONLY) (NEEDLE) ×2 IMPLANT
NEEDLE HYPO 25GX1X1/2 BEV (NEEDLE) ×2 IMPLANT
NS IRRIG 1000ML POUR BTL (IV SOLUTION) ×6 IMPLANT
OBTURATOR OPTICAL STANDARD 8MM (TROCAR)
OBTURATOR OPTICAL STND 8 DVNC (TROCAR)
OBTURATOR OPTICALSTD 8 DVNC (TROCAR) IMPLANT
PACK CHEST (CUSTOM PROCEDURE TRAY) ×2 IMPLANT
PAD ARMBOARD 7.5X6 YLW CONV (MISCELLANEOUS) ×10 IMPLANT
PORT ACCESS TROCAR AIRSEAL 12 (TROCAR) ×1 IMPLANT
PORT ACCESS TROCAR AIRSEAL 5M (TROCAR) ×1
RELOAD STAPLER 3.5X45 BLU DVNC (STAPLE) ×3 IMPLANT
SEAL CANN UNIV 5-8 DVNC XI (MISCELLANEOUS) ×2 IMPLANT
SEAL XI 5MM-8MM UNIVERSAL (MISCELLANEOUS) ×2
SEALANT SURG COSEAL 4ML (VASCULAR PRODUCTS) IMPLANT
SEALANT SURG COSEAL 8ML (VASCULAR PRODUCTS) IMPLANT
SET TRI-LUMEN FLTR TB AIRSEAL (TUBING) ×2 IMPLANT
SOLUTION ELECTROLUBE (MISCELLANEOUS) IMPLANT
SPONGE INTESTINAL PEANUT (DISPOSABLE) IMPLANT
SPONGE T-LAP 18X18 ~~LOC~~+RFID (SPONGE) ×2 IMPLANT
STAPLER 45 DA VINCI SURE FORM (STAPLE) ×1
STAPLER 45 SUREFORM DVNC (STAPLE) ×1 IMPLANT
STAPLER CANNULA SEAL DVNC XI (STAPLE) ×2 IMPLANT
STAPLER CANNULA SEAL XI (STAPLE) ×2
STAPLER RELOAD 3.5X45 BLU DVNC (STAPLE) ×3
STAPLER RELOAD 3.5X45 BLUE (STAPLE) ×3
STOPCOCK 4 WAY LG BORE MALE ST (IV SETS) ×2 IMPLANT
SUT MON AB 2-0 CT1 36 (SUTURE) ×4 IMPLANT
SUT PDS AB 1 CTX 36 (SUTURE) IMPLANT
SUT PROLENE 4 0 RB 1 (SUTURE)
SUT PROLENE 4-0 RB1 .5 CRCL 36 (SUTURE) IMPLANT
SUT SILK  1 MH (SUTURE) ×2
SUT SILK 1 MH (SUTURE) ×2 IMPLANT
SUT SILK 1 TIES 10X30 (SUTURE) IMPLANT
SUT SILK 2 0 SH (SUTURE) ×2 IMPLANT
SUT SILK 2 0SH CR/8 30 (SUTURE) IMPLANT
SUT VIC AB 1 CTX 36 (SUTURE)
SUT VIC AB 1 CTX36XBRD ANBCTR (SUTURE) IMPLANT
SUT VIC AB 2-0 CT1 27 (SUTURE) ×1
SUT VIC AB 2-0 CT1 TAPERPNT 27 (SUTURE) ×1 IMPLANT
SUT VIC AB 3-0 SH 27 (SUTURE) ×3
SUT VIC AB 3-0 SH 27X BRD (SUTURE) ×3 IMPLANT
SUT VICRYL 0 TIES 12 18 (SUTURE) ×2 IMPLANT
SUT VICRYL 0 UR6 27IN ABS (SUTURE) ×4 IMPLANT
SUT VICRYL 2 TP 1 (SUTURE) IMPLANT
SYR 10ML LL (SYRINGE) ×2 IMPLANT
SYR 20ML LL LF (SYRINGE) ×2 IMPLANT
SYR 50ML LL SCALE MARK (SYRINGE) ×2 IMPLANT
SYSTEM RETRIEVAL ANCHOR 8 (MISCELLANEOUS) ×2 IMPLANT
SYSTEM SAHARA CHEST DRAIN ATS (WOUND CARE) ×2 IMPLANT
TAPE CLOTH 4X10 WHT NS (GAUZE/BANDAGES/DRESSINGS) ×2 IMPLANT
TIP APPLICATOR SPRAY EXTEND 16 (VASCULAR PRODUCTS) IMPLANT
TOWEL GREEN STERILE (TOWEL DISPOSABLE) ×2 IMPLANT
TRAY FOLEY MTR SLVR 16FR STAT (SET/KITS/TRAYS/PACK) ×2 IMPLANT
TROCAR BLADELESS 15MM (ENDOMECHANICALS) IMPLANT
TUBING EXTENTION W/L.L. (IV SETS) ×2 IMPLANT
WATER STERILE IRR 1000ML POUR (IV SOLUTION) ×2 IMPLANT

## 2021-09-11 SURGICAL SUPPLY — 44 items
ADAPTER BRONCH F/PENTAX (ADAPTER) ×3 IMPLANT
ADAPTER VALVE BIOPSY EBUS (MISCELLANEOUS) IMPLANT
ADPTR VALVE BIOPSY EBUS (MISCELLANEOUS)
BRUSH CYTOL CELLEBRITY 1.5X140 (MISCELLANEOUS) ×3 IMPLANT
BRUSH SUPERTRAX BIOPSY (INSTRUMENTS) IMPLANT
BRUSH SUPERTRAX NDL-TIP CYTO (INSTRUMENTS) ×3 IMPLANT
CANISTER SUCT 3000ML PPV (MISCELLANEOUS) ×3 IMPLANT
CHANNEL WORK EXTEND EDGE 180 (KITS) IMPLANT
CHANNEL WORK EXTEND EDGE 45 (KITS) IMPLANT
CHANNEL WORK EXTEND EDGE 90 (KITS) IMPLANT
CONT SPEC 4OZ CLIKSEAL STRL BL (MISCELLANEOUS) ×3 IMPLANT
COVER BACK TABLE 60X90IN (DRAPES) ×3 IMPLANT
FILTER STRAW FLUID ASPIR (MISCELLANEOUS) IMPLANT
FORCEPS BIOP SUPERTRX PREMAR (INSTRUMENTS) ×3 IMPLANT
GAUZE SPONGE 4X4 12PLY STRL (GAUZE/BANDAGES/DRESSINGS) ×3 IMPLANT
GLOVE SURG SS PI 7.5 STRL IVOR (GLOVE) ×6 IMPLANT
GOWN STRL REUS W/ TWL LRG LVL3 (GOWN DISPOSABLE) ×4 IMPLANT
GOWN STRL REUS W/TWL LRG LVL3 (GOWN DISPOSABLE) ×2
KIT CLEAN ENDO COMPLIANCE (KITS) ×3 IMPLANT
KIT LOCATABLE GUIDE (CANNULA) IMPLANT
KIT MARKER FIDUCIAL DELIVERY (KITS) IMPLANT
KIT PROCEDURE EDGE 180 (KITS) IMPLANT
KIT PROCEDURE EDGE 45 (KITS) IMPLANT
KIT PROCEDURE EDGE 90 (KITS) IMPLANT
KIT TURNOVER KIT B (KITS) ×3 IMPLANT
MARKER SKIN DUAL TIP RULER LAB (MISCELLANEOUS) ×3 IMPLANT
NEEDLE SUPERTRX PREMARK BIOPSY (NEEDLE) ×3 IMPLANT
NS IRRIG 1000ML POUR BTL (IV SOLUTION) ×3 IMPLANT
OIL SILICONE PENTAX (PARTS (SERVICE/REPAIRS)) ×3 IMPLANT
PAD ARMBOARD 7.5X6 YLW CONV (MISCELLANEOUS) ×6 IMPLANT
PATCHES PATIENT (LABEL) ×9 IMPLANT
SOL ANTI FOG 6CC (MISCELLANEOUS) ×2 IMPLANT
SOLUTION ANTI FOG 6CC (MISCELLANEOUS) ×1
SYR 20CC LL (SYRINGE) ×3 IMPLANT
SYR 20ML ECCENTRIC (SYRINGE) ×3 IMPLANT
SYR 50ML SLIP (SYRINGE) ×3 IMPLANT
TOWEL OR 17X24 6PK STRL BLUE (TOWEL DISPOSABLE) ×3 IMPLANT
TRAP SPECIMEN MUCOUS 40CC (MISCELLANEOUS) IMPLANT
TUBE CONNECTING 20X1/4 (TUBING) ×3 IMPLANT
UNDERPAD 30X30 (UNDERPADS AND DIAPERS) ×3 IMPLANT
VALVE BIOPSY  SINGLE USE (MISCELLANEOUS) ×1
VALVE BIOPSY SINGLE USE (MISCELLANEOUS) ×2 IMPLANT
VALVE SUCTION BRONCHIO DISP (MISCELLANEOUS) ×3 IMPLANT
WATER STERILE IRR 1000ML POUR (IV SOLUTION) ×3 IMPLANT

## 2021-09-11 NOTE — Interval H&P Note (Signed)
History and Physical Interval Note:  09/11/2021 6:45 AM  Patricia Mathews  has presented today for surgery, with the diagnosis of Carcinoid.  The various methods of treatment have been discussed with the patient and family. After consideration of risks, benefits and other options for treatment, the patient has consented to  Procedure(s): XI ROBOTIC ASSISTED THORASCOPY- RIGHT LOWER LOBE WEDGE RESECTION (Right) as a surgical intervention.  The patient's history has been reviewed, patient examined, no change in status, stable for surgery.  I have reviewed the patient's chart and labs.  Questions were answered to the patient's satisfaction.     Paulett Kaufhold Bary Leriche

## 2021-09-11 NOTE — Anesthesia Procedure Notes (Signed)
Procedure Name: Intubation Date/Time: 09/11/2021 8:58 AM Performed by: Murvin Natal, MD Pre-anesthesia Checklist: Patient identified, Emergency Drugs available, Suction available and Patient being monitored Patient Re-evaluated:Patient Re-evaluated prior to induction Oxygen Delivery Method: Circle System Utilized Preoxygenation: Pre-oxygenation with 100% oxygen Induction Type: Combination inhalational/ intravenous induction Ventilation: Mask ventilation without difficulty and Oral airway inserted - appropriate to patient size Laryngoscope Size: Mac, 3 and Glidescope Grade View: Grade I Tube type: Oral Tube size: 8.5 mm Number of attempts: 3 Airway Equipment and Method: Stylet and Oral airway Placement Confirmation: ETT inserted through vocal cords under direct vision, positive ETCO2 and breath sounds checked- equal and bilateral Secured at: 22 cm Tube secured with: Tape Dental Injury: Teeth and Oropharynx as per pre-operative assessment  Comments: DL by CRNA to obtain grade 2 view. Bougie used to remove existing ETT. Attempted placement of 37 DLT over bougie. Unable to pass through vocal cords. Bougie removed and mask ventilated patient. DL by CRNA again to obtain grade 2 view. Attempted to place 37 DLT without the use of a bougie, unable to do so. Mask ventilated patient. DL by MDA with Mac 3 to obtain grade 2 view, decision made to mask ventilate the patient until glidescope available. DL by MDA with glidescope 3 to obtain grade 1 view. Unable to pass 37 DLT and 35 DLT. Decision made to place 8.5 single lumen ETT with glidescope 3.

## 2021-09-11 NOTE — Hospital Course (Addendum)
History of Present Illness:  Patricia Mathews is a 60 yo female known to [facility].  She originally underwent resection of a 9 mm right lower lobe nodule back in May.  Pathology revealed this to be a carcinoid tumor.  Her post operative recovery was uncomplicated.  She underwent CT scan with calcium score which the original tumor was still present.  She presented back to Dr. Kipp Brood for repeat surgical consultation.  It was felt the patient should undergo bronchoscopy with tumor marking and repeat robotic VATS for resection of the pulmonary nodule.  The risks and benefits of the procedure were explained to the patient and he was agreeable to proceed.  Hospital Course:  Patricia Mathews presented to [facility] on 09/11/2021.  She was taken to the operating room and underwent Robotic Assisted Right Video Thoracoscopy with wedge resection of right lower lobe and intercostal nerve block.  She tolerated the procedure without difficulty and was extubated, and taken to the PACU in stable condition.  The patient had post operative pain.  Her chest tube was free from air leak.  Her chest tube output remained low.  CXR was free from pneumothorax.  Her chest tube was removed on POD #1.  Follow up CXR showed no evidence of pneumothorax.  The patient was restarted on her home antihypertensive agents.  Her surgical incisions are healing without evidence of infection.  The patient was felt medically stable for discharge home today.

## 2021-09-11 NOTE — Op Note (Signed)
      [address]       [place] [address]             [phone]        09/11/2021  Patient:  Patricia Mathews Pre-Op Dx: History of right lower lobe carcinoid tumor   Right lower lobe pulmonary nodule Post-op Dx: Same Procedure: - Robotic assisted right video thoracoscopy -Right lower lobe wedge resection  - Intercostal nerve block  Surgeon and Role:      * Valor Quaintance, Lucile Crater, MD - Primary    *E. Barrett, PA-C- assisting  Anesthesia  general EBL:  42ml Blood Administration: None Specimen: Right lower lobe wedge resection  Drains: 19 F Blake chest tube in right chest Counts: correct   Indications: This is a 60 year old female the previously underwent a right lower lobe wedge resection for a carcinoid tumorlet.  She had a persistent right lower lobe nodule that was also concerning.  She comes in today for her redo robotic assisted thoracoscopy and wedge resection of the lesion after marking bronchoscopically. Findings: Methylene blue and ICG were easily evident.  It corresponded with that area in the right lower lobe that contain the nodule.  This was wedged out between several layers of the robotic stapler.  The specimen had a firm well-circumscribed nodule.  The frozen section confirmed this.  Operative Technique: After the risks, benefits and alternatives were thoroughly discussed, the patient was brought to the operative theatre.  Anesthesia was induced, and the patient was then placed in a left lateral decubitus position and was prepped and draped in normal sterile fashion.  An appropriate surgical pause was performed, and pre-operative antibiotics were dosed accordingly.  We began by placing our 4 robotic ports in the the 7th intercostal space targeting the hilum of the lung.  The robot was then docked and all instruments were passed under direct visualization.    The nodule had previously been marked, and the methylene blue was evident on the  pleural surface.  Firefly was activated which confirmed this.  This area of the lower lobe was grasped and resected between several fires of the robotic stapler.  It was then placed in an Endo Catch bag and removed from the anteriormost port.    Frozen section confirmed that we had completely resected the nodule.  The chest was irrigated, and an air leak test was performed.  An intercostal nerve block was performed under direct visualization.  A 19 F chest with then placed, and we watch the remaining lobes re-expand.  The skin and soft tissue were closed with absorbable suture    The patient tolerated the procedure without any immediate complications, and was transferred to the PACU in stable condition.  Patricia Mathews

## 2021-09-11 NOTE — Plan of Care (Signed)

## 2021-09-11 NOTE — Anesthesia Procedure Notes (Addendum)
Procedure Name: Intubation Date/Time: 09/11/2021 7:41 AM Performed by: Ardyth Harps, CRNA Pre-anesthesia Checklist: Patient identified, Emergency Drugs available, Suction available and Patient being monitored Patient Re-evaluated:Patient Re-evaluated prior to induction Oxygen Delivery Method: Circle System Utilized Preoxygenation: Pre-oxygenation with 100% oxygen Induction Type: IV induction Ventilation: Mask ventilation without difficulty and Oral airway inserted - appropriate to patient size Laryngoscope Size: Mac and 3 Grade View: Grade II Tube type: Oral Tube size: 8.5 mm Number of attempts: 1 Airway Equipment and Method: Stylet and Oral airway Placement Confirmation: ETT inserted through vocal cords under direct vision, positive ETCO2 and breath sounds checked- equal and bilateral Secured at: 21 cm Tube secured with: Tape Dental Injury: Teeth and Oropharynx as per pre-operative assessment

## 2021-09-11 NOTE — Transfer of Care (Signed)
Immediate Anesthesia Transfer of Care Note  Patient: Patricia Mathews  Procedure(s) Performed: VIDEO BRONCHOSCOPY WITH ENDOBRONCHIAL NAVIGATION FIDUCIAL DYE MARKING  Patient Location: PACU  Anesthesia Type:General  Level of Consciousness: awake and alert   Airway & Oxygen Therapy: Patient Spontanous Breathing and Patient connected to nasal cannula oxygen  Post-op Assessment: Report given to RN and Post -op Vital signs reviewed and stable  Post vital signs: Reviewed and stable  Last Vitals:  Vitals Value Taken Time  BP 142/89 09/11/21 1059  Temp    Pulse 94 09/11/21 1102  Resp 15 09/11/21 1102  SpO2 94 % 09/11/21 1102  Vitals shown include unvalidated device data.  Last Pain:  Vitals:   09/11/21 0620  TempSrc:   PainSc: 5       Patients Stated Pain Goal: 2 (07/62/26 3335)  Complications: No notable events documented.

## 2021-09-11 NOTE — Progress Notes (Signed)
Discussed CPAP w/ RN, pt ETCO2 is being monitored so CPAP will be held tonight. Pt is resting comfortably on 2L [place] sat 96%. RT will cont to monitor.

## 2021-09-11 NOTE — Discharge Summary (Signed)
Physician Discharge Summary  Patient ID: Patricia Mathews MRN: [id number] DOB/AGE: 07-26-1961 60 y.o.  Admit date: 09/11/2021 Discharge date: 09/12/2021  Admission Diagnoses:  Patient Active Problem List   Diagnosis Date Noted   Carcinoid tumor    Malignant carcinoid tumor of the bronchus and lung ([place]) 04/18/2021   Drug induced constipation 04/18/2021   Right lower lobe pulmonary nodule 03/26/2021   Essential hypertension, benign 12/25/2019   Other forms of systemic lupus erythematosus ([place]) 12/25/2019   Sjogren's syndrome ([place]) 12/25/2019   GAD (generalized anxiety disorder) 12/25/2019   Obstructive sleep apnea treated with continuous positive airway pressure (CPAP) 09/01/2018   Sleep related headaches 01/03/2018   Snoring 01/03/2018   Hypersomnia with sleep apnea 01/03/2018   Arthralgia 01/03/2018   Multiple neurological symptoms 12/28/2017   Discharge Diagnoses:   Patient Active Problem List   Diagnosis Date Noted   S/P partial lobectomy of lung 09/11/2021   Carcinoid tumor    Malignant carcinoid tumor of the bronchus and lung ([place]) 04/18/2021   Drug induced constipation 04/18/2021   Right lower lobe pulmonary nodule 03/26/2021   Essential hypertension, benign 12/25/2019   Other forms of systemic lupus erythematosus ([place]) 12/25/2019   Sjogren's syndrome ([place]) 12/25/2019   GAD (generalized anxiety disorder) 12/25/2019   Obstructive sleep apnea treated with continuous positive airway pressure (CPAP) 09/01/2018   Sleep related headaches 01/03/2018   Snoring 01/03/2018   Hypersomnia with sleep apnea 01/03/2018   Arthralgia 01/03/2018   Multiple neurological symptoms 12/28/2017   Discharged Condition: good  History of Present Illness:  Patricia Mathews is a 61 yo female known to [facility].  She originally underwent resection of a 9 mm right lower lobe nodule back in May.  Pathology revealed this to be a carcinoid tumor.  Her post operative recovery was uncomplicated.  She  underwent CT scan with calcium score which the original tumor was still present.  She presented back to Dr. Kipp Brood for repeat surgical consultation.  It was felt the patient should undergo bronchoscopy with tumor marking and repeat robotic VATS for resection of the pulmonary nodule.  The risks and benefits of the procedure were explained to the patient and he was agreeable to proceed.  Hospital Course:  Patricia Mathews presented to [facility] on 09/11/2021.  She was taken to the operating room and underwent Robotic Assisted Right Video Thoracoscopy with wedge resection of right lower lobe and intercostal nerve block.  She tolerated the procedure without difficulty and was extubated, and taken to the PACU in stable condition.  The patient had post operative pain.  Her chest tube was free from air leak.  Her chest tube output remained low.  CXR was free from pneumothorax.  Her chest tube was removed on POD #1.  Follow up CXR showed no evidence of pneumothorax.  The patient was restarted on her home antihypertensive agents.  Her surgical incisions are healing without evidence of infection.  The patient was felt medically stable for discharge home today.  Consults: pulmonary/intensive care  Significant Diagnostic Studies: radiology: CT scan:    IMPRESSION: 1. 9 mm peripheral right lower lobe nodule, stable from 01/17/2021 and possibly minimally increased from 8 mm on 11/01/2019. Malignancy cannot be excluded. The study was done for preoperative planning.  Treatments: surgery:   09/11/2021   Patient:  Patricia Mathews Pre-Op Dx: History of right lower lobe carcinoid tumor  Right lower lobe pulmonary nodule Post-op Dx: Same Procedure: - Robotic assisted right video thoracoscopy -Right lower lobe wedge resection  - Intercostal nerve block   Surgeon and Role:      * Lightfoot, Lucile Crater, MD - Primary    *E. Zakarie Sturdivant, PA-C- assisting  PATHOLOGY: remains  pending  Discharge Exam: Blood pressure 122/65, pulse 68, temperature 97.6 F (36.4 C), temperature source Oral, resp. rate 14, height _0  (1.651 m), weight 99.3 kg, SpO2 96 %.  General appearance: alert, cooperative, and no distress Heart: regular rate and rhythm Lungs: clear to auscultation bilaterally Abdomen: soft, non-tender; bowel sounds normal; no masses,  no organomegaly   Disposition:    Allergies as of 09/12/2021       Reactions   Penicillins Anaphylaxis        Medication List     TAKE these medications    acetaminophen 500 MG tablet Commonly known as: TYLENOL Take 1,000 mg by mouth every 6 (six) hours as needed for moderate pain or headache.   ALPRAZolam 0.25 MG tablet Commonly known as: XANAX Take 0.25 mg by mouth 2 (two) times daily as needed for anxiety.   B-12 3000 MCG Caps Take 3,000 mg by mouth daily.   BLACK ELDERBERRY PO Take 1,000 mg by mouth daily.   CareTouch 2 CPAP Hose [place] Misc by Does not apply route.   DULoxetine 30 MG capsule Commonly known as: CYMBALTA TAKE 1 CAPSULE (30 MG TOTAL) BY MOUTH 2 (TWO) TIMES DAILY. What changed:  how much to take how to take this when to take this   Flovent HFA 110 MCG/ACT inhaler Generic drug: fluticasone Inhale 2 puffs into the lungs daily.   HYDROcodone-acetaminophen 5-325 MG tablet Commonly known as: NORCO/VICODIN Take 1 tablet by mouth every 4 (four) hours as needed for moderate pain.   losartan-hydrochlorothiazide 50-12.5 MG tablet Commonly known as: HYZAAR TAKE 1 TABLET BY MOUTH DAILY.   MELATONIN PO Take 1 capsule by mouth at bedtime as needed (sleep).   METAMUCIL FIBER PO Take 1 Scoop by mouth daily as needed (constipation).   Na Sulfate-K Sulfate-Mg Sulf 17.5-3.13-1.6 GM/177ML Soln Commonly known as: Suprep Bowel Prep Kit Use as directed   OVER THE COUNTER MEDICATION Take 2 tablets by mouth daily. Olly digestive enzyme blend   rosuvastatin 10 MG tablet Commonly known  as: CRESTOR TAKE 1 TABLET (10 MG TOTAL) BY MOUTH DAILY.        Follow-up Information     Lajuana Matte, MD Follow up on 09/19/2021.   Specialty: Cardiothoracic Surgery Why: Appointment is at 9:50 Contact information: [address] [place] [address] [phone]                 Signed: Ellwood Handler, PA-C  09/12/2021, 1:21 PM

## 2021-09-11 NOTE — Brief Op Note (Signed)
09/11/2021  11:40 AM  PATIENT:  Patricia Mathews  60 y.o. female  PRE-OPERATIVE DIAGNOSIS:  Carcinoid  POST-OPERATIVE DIAGNOSIS:  Carcinoid  PROCEDURE:  Procedure(s):  XI ROBOTIC ASSISTED THORASCOPY-  RIGHT LOWER LOBE WEDGE RESECTION (Right) LYMPH NODE DISSECTION (Right) INTERCOSTAL NERVE BLOCK (Right)  SURGEON:  Surgeon(s) and Role:    * Lightfoot, Lucile Crater, MD - Primary  PHYSICIAN ASSISTANT: Amylah Will PA-C  ASSISTANTS: none   ANESTHESIA:   general  EBL:  20 mL   BLOOD ADMINISTERED:none  DRAINS:  19 Blake    LOCAL MEDICATIONS USED:  BUPIVICAINE   SPECIMEN:  Source of Specimen:  Right Lower Lobe Wedge Resection  DISPOSITION OF SPECIMEN:  PATHOLOGY  COUNTS:  YES  TOURNIQUET:  * No tourniquets in log *  DICTATION: .Dragon Dictation  PLAN OF CARE: Admit for overnight observation  PATIENT DISPOSITION:  PACU - hemodynamically stable.   Delay start of Pharmacological VTE agent (>24hrs) due to surgical blood loss or risk of bleeding: no

## 2021-09-11 NOTE — Interval H&P Note (Signed)
History and Physical Interval Note:  09/11/2021 7:24 AM  Patricia Mathews  has presented today for surgery, with the diagnosis of lung mass.  The various methods of treatment have been discussed with the patient and family. After consideration of risks, benefits and other options for treatment, the patient has consented to  Procedure(s): [place] (N/A) as a surgical intervention.  The patient's history has been reviewed, patient examined, no change in status, stable for surgery.  I have reviewed the patient's chart and labs.  Questions were answered to the patient's satisfaction.     [place]

## 2021-09-11 NOTE — Op Note (Signed)
Video Bronchoscopy with Robotic Assisted Bronchoscopic Navigation   Date of Operation: 09/11/2021   Pre-op Diagnosis: RLL nodule  Post-op Diagnosis: RLL nodule  Surgeon: Garner [place], DO   Assistants: None   Anesthesia: General endotracheal anesthesia  Operation: Flexible video fiberoptic bronchoscopy with robotic assistance and biopsies.  Estimated Blood Loss: Minimal  Complications: None  Indications and History: Patricia Mathews is a 60 y.o. female with history of RLL nodule. The risks, benefits, complications, treatment options and expected outcomes were discussed with the patient.  The possibilities of pneumothorax, pneumonia, reaction to medication, pulmonary aspiration, perforation of a viscus, bleeding, failure to diagnose a condition and creating a complication requiring transfusion or operation were discussed with the patient who freely signed the consent.    Description of Procedure: The patient was seen in the Preoperative Area, was examined and was deemed appropriate to proceed.  The patient was taken to [facility] endoscopy room 3, identified as Patricia Mathews and the procedure verified as Flexible Video Fiberoptic Bronchoscopy.  A Time Out was held and the above information confirmed.   Prior to the date of the procedure a high-resolution CT scan of the chest was performed. Utilizing ION software program a virtual tracheobronchial tree was generated to allow the creation of distinct navigation pathways to the patient's parenchymal abnormalities. After being taken to the operating room general anesthesia was initiated and the patient  was orally intubated. The video fiberoptic bronchoscope was introduced via the endotracheal tube and a general inspection was performed which showed normal right and left lung anatomy, aspiration of the bilateral mainstems was completed to remove any remaining secretions. Robotic catheter inserted into patient's endotracheal tube.   Target #1 RLL  Nodule: The distinct navigation pathways prepared prior to this procedure were then utilized to navigate to patient's lesion identified on CT scan. The robotic catheter was secured into place and the vision probe was withdrawn.  Lesion location was approximated using fluoroscopy and radial endobronchial ultrasound for peripheral targeting.  Using 3-dimensional CBCT imaging we confirmed the location of the nodule.  Catheter was directed toward the 8 mm lesion.  Under fluoroscopic guidance using a preloaded transbronchial needle 1 cc of 50% mixture of ICG/methylene blue was injected.  At the end of the procedure a general airway inspection was performed and there was no evidence of active bleeding. The bronchoscope was removed.  The patient tolerated the procedure well. There was no significant blood loss and there were no obvious complications.  Samples Target #1: 1. Transbronchial needle used for fiducial dye marking  Plans:  Following dye marking.  The patient was transferred under the care of anesthesia to the operating room 10 with Dr. Kipp Brood for planned resection.  Garner [place], DO [facility] 09/11/2021 8:50 AM

## 2021-09-11 NOTE — Anesthesia Postprocedure Evaluation (Signed)
Anesthesia Post Note  Patient: Patricia Mathews  Procedure(s) Performed: VIDEO BRONCHOSCOPY WITH ENDOBRONCHIAL NAVIGATION FIDUCIAL DYE MARKING XI ROBOTIC ASSISTED THORASCOPY- RIGHT LOWER LOBE WEDGE RESECTION (Right: Chest) LYMPH NODE DISSECTION (Right: Chest) INTERCOSTAL NERVE BLOCK (Right: Chest)     Patient location during evaluation: PACU Anesthesia Type: General Level of consciousness: awake Pain management: pain level controlled Vital Signs Assessment: post-procedure vital signs reviewed and stable Respiratory status: spontaneous breathing, nonlabored ventilation, respiratory function stable and patient connected to nasal cannula oxygen Cardiovascular status: blood pressure returned to baseline and stable Postop Assessment: no apparent nausea or vomiting Anesthetic complications: no   No notable events documented.  Last Vitals:  Vitals:   09/11/21 1930 09/11/21 1949  BP:  133/71  Pulse:  80  Resp: 18 18  Temp:  36.6 C  SpO2: 95% 96%    Last Pain:  Vitals:   09/11/21 1949  TempSrc: Oral  PainSc:                  Karyl Kinnier Hiroki Wint

## 2021-09-12 ENCOUNTER — Observation Stay (HOSPITAL_COMMUNITY): Payer: 59

## 2021-09-12 ENCOUNTER — Other Ambulatory Visit (HOSPITAL_COMMUNITY): Payer: Self-pay

## 2021-09-12 ENCOUNTER — Encounter (HOSPITAL_COMMUNITY): Payer: Self-pay | Admitting: Thoracic Surgery (Cardiothoracic Vascular Surgery)

## 2021-09-12 ENCOUNTER — Encounter: Payer: 59 | Admitting: Physical Therapy

## 2021-09-12 ENCOUNTER — Inpatient Hospital Stay (HOSPITAL_COMMUNITY): Payer: 59

## 2021-09-12 DIAGNOSIS — Z20822 Contact with and (suspected) exposure to covid-19: Secondary | ICD-10-CM | POA: Diagnosis present

## 2021-09-12 DIAGNOSIS — D3A00 Benign carcinoid tumor of unspecified site: Secondary | ICD-10-CM | POA: Diagnosis present

## 2021-09-12 DIAGNOSIS — Z87891 Personal history of nicotine dependence: Secondary | ICD-10-CM | POA: Diagnosis not present

## 2021-09-12 DIAGNOSIS — I517 Cardiomegaly: Secondary | ICD-10-CM | POA: Diagnosis not present

## 2021-09-12 DIAGNOSIS — G4733 Obstructive sleep apnea (adult) (pediatric): Secondary | ICD-10-CM | POA: Diagnosis present

## 2021-09-12 DIAGNOSIS — I10 Essential (primary) hypertension: Secondary | ICD-10-CM | POA: Diagnosis present

## 2021-09-12 DIAGNOSIS — C7A090 Malignant carcinoid tumor of the bronchus and lung: Secondary | ICD-10-CM | POA: Diagnosis present

## 2021-09-12 DIAGNOSIS — I4510 Unspecified right bundle-branch block: Secondary | ICD-10-CM | POA: Diagnosis present

## 2021-09-12 DIAGNOSIS — Z4682 Encounter for fitting and adjustment of non-vascular catheter: Secondary | ICD-10-CM | POA: Diagnosis not present

## 2021-09-12 DIAGNOSIS — J9811 Atelectasis: Secondary | ICD-10-CM | POA: Diagnosis not present

## 2021-09-12 LAB — BASIC METABOLIC PANEL
Anion gap: 5 (ref 5–15)
BUN: 13 mg/dL (ref 8–23)
CO2: 30 mmol/L (ref 22–32)
Calcium: 8.8 mg/dL — ABNORMAL LOW (ref 8.9–10.3)
Chloride: 100 mmol/L (ref 98–111)
Creatinine, Ser: 0.81 mg/dL (ref 0.44–1.00)
GFR, Estimated: >60 mL/min (ref >60)
Glucose, Bld: 128 mg/dL — ABNORMAL HIGH (ref 70–99)
Potassium: 4.2 mmol/L (ref 3.5–5.1)
Sodium: 135 mmol/L (ref 135–145)

## 2021-09-12 LAB — CBC
HCT: 38.3 % (ref 36.0–46.0)
Hemoglobin: 12.4 g/dL (ref 12.0–15.0)
MCH: 30.8 pg (ref 26.0–34.0)
MCHC: 32.4 g/dL (ref 30.0–36.0)
MCV: 95.3 fL (ref 80.0–100.0)
Platelets: 321 10*3/uL (ref 150–400)
RBC: 4.02 MIL/uL (ref 3.87–5.11)
RDW: 13.1 % (ref 11.5–15.5)
WBC: 9.4 10*3/uL (ref 4.0–10.5)
nRBC: 0 % (ref 0.0–0.2)

## 2021-09-12 MED ORDER — KETOROLAC TROMETHAMINE 15 MG/ML IJ SOLN
15.0000 mg | Freq: Once | INTRAMUSCULAR | Status: AC
  Administered 2021-09-12: 15 mg via INTRAVENOUS
  Filled 2021-09-12: qty 1

## 2021-09-12 MED ORDER — HYDROCODONE-ACETAMINOPHEN 5-325 MG PO TABS
1.0000 | ORAL_TABLET | ORAL | 0 refills | Status: DC | PRN
  Filled 2021-09-12: qty 30, 5d supply, fill #0

## 2021-09-12 NOTE — Progress Notes (Signed)
Mobility Specialist Progress Note    09/12/21 1418  Mobility  Activity Ambulated in hall  Level of Assistance Independent  Assistive Device None  Distance Ambulated (ft) 185 ft  Mobility Ambulated independently in hallway  Mobility Response Tolerated well  Mobility performed by Mobility specialist  $Mobility charge 1 Mobility   Pt received in bed and agreeable. No complaints on walk. Returned to bed with husband present.   Hildred Alamin Mobility Specialist  Mobility Specialist Phone: [phone]

## 2021-09-12 NOTE — TOC Progression Note (Signed)
Transition of Care [facility]) - Progression Note    Patient Details  Name: Patricia Mathews MRN: [id number] Date of Birth: 03/07/1961  [facility] CM/SW Contact  Zenon Mayo, RN Phone Number: 09/12/2021, 1:07 PM  Clinical Narrative:    Patient is from home with spouse, s/p lobe resection, chest tube to be dc'd today, for xray, plan to dc home today if ok, may need home oxygen at discharge.  NCM spoke with patient over the phone, asked if she has a preference for DME company if needs oxygen, she states she has no preference, she can use [employer] for oxygen if needed.         Expected Discharge Plan and Services                                                 Social Determinants of Health (SDOH) Interventions    Readmission Risk Interventions No flowsheet data found.

## 2021-09-12 NOTE — Progress Notes (Signed)
      [address]       [place], [address]             [phone]      1 Day Post-Op Procedure(s) (LRB): XI ROBOTIC ASSISTED THORASCOPY- RIGHT LOWER LOBE WEDGE RESECTION (Right) LYMPH NODE DISSECTION (Right) INTERCOSTAL NERVE BLOCK (Right)  Subjective:  Patient having pain along her right shoulder and right breast area.   Objective: Vital signs in last 24 hours: Temp:  [97.6 F (36.4 C)-98.3 F (36.8 C)] 97.6 F (36.4 C) (11/04 0251) Pulse Rate:  [68-100] 68 (11/04 0251) Cardiac Rhythm: Normal sinus rhythm (11/04 0300) Resp:  [10-21] 14 (11/04 0300) BP: (112-151)/(63-89) 122/65 (11/04 0251) SpO2:  [95 %-99 %] 98 % (11/04 0300) FiO2 (%):  [21 %] 21 % (11/03 1607)  Intake/Output from previous day: 11/03 0701 - 11/04 0700 In: 2676.9 [P.O.:477; I.V.:1700; IV Piggyback:499.9] Out: 470 [Urine:250; Blood:20; Chest Tube:200]  General appearance: alert, cooperative, and no distress Heart: regular rate and rhythm Lungs: clear to auscultation bilaterally Abdomen: soft, non-tender; bowel sounds normal; no masses,  no organomegaly  Lab Results: Recent Labs    09/09/21 1026 09/12/21 0131  WBC 8.2 9.4  HGB 13.5 12.4  HCT 41.0 38.3  PLT 321 321   BMET:  Recent Labs    09/09/21 1026 09/12/21 0131  NA 136 135  K 4.2 4.2  CL 103 100  CO2 24 30  GLUCOSE 108* 128*  BUN 18 13  CREATININE 0.65 0.81  CALCIUM 9.4 8.8*    PT/INR:  Recent Labs    09/09/21 1026  LABPROT 12.2  INR 0.9   ABG    Component Value Date/Time   PHART 7.410 09/09/2021 1052   HCO3 27.1 09/09/2021 1052   TCO2 27 11/05/2020 0831   O2SAT 97.2 09/09/2021 1052   CBG (last 3)  No results for input(s): GLUCAP in the last 72 hours.  Assessment/Plan: S/P Procedure(s) (LRB): XI ROBOTIC ASSISTED THORASCOPY- RIGHT LOWER LOBE WEDGE RESECTION (Right) LYMPH NODE DISSECTION (Right) INTERCOSTAL NERVE BLOCK (Right)  CV- Sinus Bradycardia, H/O HTN- continue Cozaar Pulm- no air leak  from CT, output is low, CXR w/o pneumothorax, will d/c chest tube today Pain control- patient states not getting, enough relief- she is on Morphine PCA, Tramadol, and Toradol.. her chest tube is being removed today which should improve her pain, will not provide more pain medication Dispo- patient stable, d/c chest tube, will get repeat CXR in remains stable, will d/c home today   LOS: 0 days   Ellwood Handler, PA-C 09/12/2021

## 2021-09-12 NOTE — Plan of Care (Signed)
  Problem: Education: Goal: Knowledge of General Education information will improve Description: Including pain rating scale, medication(s)/side effects and non-pharmacologic comfort measures Outcome: Adequate for Discharge   Problem: Health Behavior/Discharge Planning: Goal: Ability to manage health-related needs will improve Outcome: Adequate for Discharge   Problem: Clinical Measurements: Goal: Ability to maintain clinical measurements within normal limits will improve Outcome: Adequate for Discharge Goal: Will remain free from infection Outcome: Adequate for Discharge Goal: Diagnostic test results will improve Outcome: Adequate for Discharge Goal: Respiratory complications will improve Outcome: Adequate for Discharge Goal: Cardiovascular complication will be avoided Outcome: Adequate for Discharge   Problem: Activity: Goal: Risk for activity intolerance will decrease Outcome: Adequate for Discharge   Problem: Nutrition: Goal: Adequate nutrition will be maintained Outcome: Adequate for Discharge   Problem: Coping: Goal: Level of anxiety will decrease Outcome: Adequate for Discharge   Problem: Elimination: Goal: Will not experience complications related to bowel motility Outcome: Adequate for Discharge Goal: Will not experience complications related to urinary retention Outcome: Adequate for Discharge   Problem: Pain Managment: Goal: General experience of comfort will improve Outcome: Adequate for Discharge   Problem: Safety: Goal: Ability to remain free from injury will improve Outcome: Adequate for Discharge   Problem: Skin Integrity: Goal: Risk for impaired skin integrity will decrease Outcome: Adequate for Discharge   Problem: Education: Goal: Knowledge of disease or condition will improve Outcome: Adequate for Discharge Goal: Knowledge of the prescribed therapeutic regimen will improve Outcome: Adequate for Discharge   Problem: Activity: Goal: Risk for  activity intolerance will decrease Outcome: Adequate for Discharge   Problem: Cardiac: Goal: Will achieve and/or maintain hemodynamic stability Outcome: Adequate for Discharge   Problem: Clinical Measurements: Goal: Postoperative complications will be avoided or minimized Outcome: Adequate for Discharge   Problem: Respiratory: Goal: Respiratory status will improve Outcome: Adequate for Discharge   Problem: Pain Management: Goal: Pain level will decrease Outcome: Adequate for Discharge   Problem: Skin Integrity: Goal: Wound healing without signs and symptoms infection will improve Outcome: Adequate for Discharge

## 2021-09-14 ENCOUNTER — Encounter (HOSPITAL_COMMUNITY): Payer: Self-pay | Admitting: Pulmonary Disease

## 2021-09-15 ENCOUNTER — Encounter: Payer: Self-pay | Admitting: *Deleted

## 2021-09-15 ENCOUNTER — Encounter: Payer: 59 | Admitting: Physical Therapy

## 2021-09-15 ENCOUNTER — Other Ambulatory Visit: Payer: Self-pay | Admitting: *Deleted

## 2021-09-15 NOTE — Patient Outreach (Signed)
[facility]) Care Management  09/15/2021  Patricia Mathews September 21, 1961 [id number]   Transition of care call/case closure   Referral received:09/11/21 Initial outreach:09/15/21 Insurance: [place] UMR    Subjective: Initial successful telephone call to patient's preferred number in order to complete transition of care assessment; 2 HIPAA identifiers verified. Explained purpose of call and completed transition of care assessment.  [name] discussed taking one day at a time, has just been emotional with this being second surgery. She reports pain being under better control this time. She denies post-operative problems says surgical incisions are unremarkable, her husband a MD observing site changing dressing, checking her blood pressure and oxygen levels. She reports tolerating mobility in the home, doing about 2000 steps. She discussed continuing to use incentive spirometry as instructed, reinforced. She reinforced tolerating diet but not much appetite yet, denies nausea. She denies bowel or bladder problems.  Spouse is assisting with her recovery.   Reviewed accessing the following [place] Benefits : She uses  a [facility] outpatient pharmacy at [facility]     Objective:  Mrs. Patricia Mathews  was hospitalized at [facility] 11/3-11/4/22 for Robotic assisted thorascopy right lower lobe resection  Comorbidities include: right lower lobe pulmonary nodule, history of right robotic assisted thoracospy of wedge restion right , carcinoid tumor  She  was discharged to home on 09/12/21 without the need for home health services or DME.   Assessment:  Patient voices good understanding of all discharge instructions.  See transition of care flowsheet for assessment details.   Plan:  Reviewed hospital discharge diagnosis of  and discharge treatment plan using hospital discharge instructions, assessing medication adherence, reviewing problems requiring provider notification, and  discussing the importance of follow up with surgeon, primary care provider and/or specialists as directed.  Reviewed [place] healthy lifestyle program information to earn healthy premium rate for  2024  :  Step 1: Create account at [email or web address] Step 2: Complete your health assessment and annual physical by September 1. 2023.  For questions call [phone]   No ongoing care management needs identified so will close case to [facility] services and route successful outreach letter with [place] Management pamphlet and 24 Hour Nurse Line Magnet to [facility] clinical pool to be mailed to patient's home address.    [name], RN, BSN  [place] Management Coordinator  [phone]- Mobile [phone]- Toll Free Main Office

## 2021-09-16 LAB — SURGICAL PATHOLOGY

## 2021-09-19 ENCOUNTER — Other Ambulatory Visit: Payer: Self-pay

## 2021-09-19 ENCOUNTER — Ambulatory Visit (INDEPENDENT_AMBULATORY_CARE_PROVIDER_SITE_OTHER): Payer: Self-pay | Admitting: Thoracic Surgery (Cardiothoracic Vascular Surgery)

## 2021-09-19 ENCOUNTER — Other Ambulatory Visit (HOSPITAL_COMMUNITY): Payer: Self-pay

## 2021-09-19 ENCOUNTER — Encounter: Payer: 59 | Admitting: Physical Therapy

## 2021-09-19 ENCOUNTER — Ambulatory Visit: Payer: 59 | Admitting: Thoracic Surgery (Cardiothoracic Vascular Surgery)

## 2021-09-19 VITALS — BP 130/78 | HR 96 | Resp 20 | Ht 65.0 in

## 2021-09-19 DIAGNOSIS — Z9889 Other specified postprocedural states: Secondary | ICD-10-CM

## 2021-09-19 DIAGNOSIS — C7A090 Malignant carcinoid tumor of the bronchus and lung: Secondary | ICD-10-CM

## 2021-09-19 DIAGNOSIS — R911 Solitary pulmonary nodule: Secondary | ICD-10-CM

## 2021-09-19 MED ORDER — PREGABALIN 25 MG PO CAPS
25.0000 mg | ORAL_CAPSULE | Freq: Two times a day (BID) | ORAL | 0 refills | Status: DC
  Filled 2021-09-19 (×2): qty 30, 15d supply, fill #0

## 2021-09-19 MED FILL — Losartan Potassium & Hydrochlorothiazide Tab 50-12.5 MG: ORAL | 90 days supply | Qty: 90 | Fill #3 | Status: AC

## 2021-09-19 NOTE — Progress Notes (Signed)
      [address]       [place] [address]             [phone]        Esther E [place] Medical Record #[id number] Date of Birth: Feb 17, 1961  Referring: Garner Nash, DO Primary Care: Horald Pollen, MD Primary Cardiologist:Traci Radford Pax, MD  Reason for visit:   follow-up  History of Present Illness:     Mrs. Grimmer presents for 1 week follow-up appointment.  She has been having a lot of pain issues while at home.  She denies any respiratory issues.  She does seem in good spirits however.  Physical Exam: BP 130/78   Pulse 96   Resp 20   Ht 5\' 5"  (1.651 m)   SpO2 95% Comment: RA  BMI 36.44 kg/m   Alert NAD Incision clean.  Abdomen soft, ND No peripheral edema   Diagnostic Studies & Laboratory data:  Path: 0.9 cm carcinoid tumor    Assessment / Plan:   60 year old female status post redo right robotic assisted thoracoscopy with wedge resection of the right lower lobe.  The 9 mm carcinoid tumor was removed.  I will see her back in 1 month with a chest x-ray.  I have given her a dose of Lyrica to assist with the pain.  She will also try lidocaine patches.   Lajuana Matte 09/19/2021 4:47 PM

## 2021-09-22 ENCOUNTER — Encounter: Payer: 59 | Admitting: Physical Therapy

## 2021-09-23 ENCOUNTER — Ambulatory Visit (INDEPENDENT_AMBULATORY_CARE_PROVIDER_SITE_OTHER): Payer: 59 | Admitting: Family Medicine

## 2021-09-25 NOTE — Telephone Encounter (Signed)
Pt had bronch on 09/11/21. Will close encounter.

## 2021-09-26 ENCOUNTER — Encounter: Payer: 59 | Admitting: Physical Therapy

## 2021-10-05 ENCOUNTER — Other Ambulatory Visit: Payer: Self-pay | Admitting: Emergency Medicine

## 2021-10-05 DIAGNOSIS — M25562 Pain in left knee: Secondary | ICD-10-CM

## 2021-10-06 ENCOUNTER — Other Ambulatory Visit: Payer: Self-pay | Admitting: Emergency Medicine

## 2021-10-06 DIAGNOSIS — G8929 Other chronic pain: Secondary | ICD-10-CM

## 2021-10-10 ENCOUNTER — Other Ambulatory Visit: Payer: Self-pay | Admitting: Cardiology

## 2021-10-10 DIAGNOSIS — I4510 Unspecified right bundle-branch block: Secondary | ICD-10-CM

## 2021-10-10 DIAGNOSIS — R002 Palpitations: Secondary | ICD-10-CM

## 2021-10-10 DIAGNOSIS — R42 Dizziness and giddiness: Secondary | ICD-10-CM

## 2021-10-10 DIAGNOSIS — I10 Essential (primary) hypertension: Secondary | ICD-10-CM

## 2021-10-10 DIAGNOSIS — E7800 Pure hypercholesterolemia, unspecified: Secondary | ICD-10-CM

## 2021-10-16 ENCOUNTER — Other Ambulatory Visit: Payer: Self-pay | Admitting: Emergency Medicine

## 2021-10-16 DIAGNOSIS — Z1231 Encounter for screening mammogram for malignant neoplasm of breast: Secondary | ICD-10-CM

## 2021-10-17 ENCOUNTER — Encounter: Payer: 59 | Admitting: Thoracic Surgery (Cardiothoracic Vascular Surgery)

## 2021-10-22 DIAGNOSIS — Z760 Encounter for issue of repeat prescription: Secondary | ICD-10-CM | POA: Diagnosis not present

## 2021-10-23 ENCOUNTER — Other Ambulatory Visit: Payer: Self-pay | Admitting: Thoracic Surgery (Cardiothoracic Vascular Surgery)

## 2021-10-23 DIAGNOSIS — C7A090 Malignant carcinoid tumor of the bronchus and lung: Secondary | ICD-10-CM

## 2021-10-24 ENCOUNTER — Ambulatory Visit (INDEPENDENT_AMBULATORY_CARE_PROVIDER_SITE_OTHER): Payer: Self-pay | Admitting: Thoracic Surgery (Cardiothoracic Vascular Surgery)

## 2021-10-24 ENCOUNTER — Ambulatory Visit
Admission: RE | Admit: 2021-10-24 | Discharge: 2021-10-24 | Disposition: A | Discharge status: Home / Self Care | Payer: 59 | Source: Ambulatory Visit | Attending: Thoracic Surgery (Cardiothoracic Vascular Surgery) | Admitting: Thoracic Surgery (Cardiothoracic Vascular Surgery)

## 2021-10-24 ENCOUNTER — Other Ambulatory Visit: Payer: Self-pay

## 2021-10-24 VITALS — BP 117/70 | HR 95 | Resp 20 | Ht 65.0 in | Wt 219.0 lb

## 2021-10-24 DIAGNOSIS — J984 Other disorders of lung: Secondary | ICD-10-CM | POA: Diagnosis not present

## 2021-10-24 DIAGNOSIS — C7A090 Malignant carcinoid tumor of the bronchus and lung: Secondary | ICD-10-CM

## 2021-10-24 DIAGNOSIS — C7A00 Malignant carcinoid tumor of unspecified site: Secondary | ICD-10-CM | POA: Diagnosis not present

## 2021-10-24 DIAGNOSIS — E785 Hyperlipidemia, unspecified: Secondary | ICD-10-CM | POA: Insufficient documentation

## 2021-10-24 DIAGNOSIS — Z9889 Other specified postprocedural states: Secondary | ICD-10-CM

## 2021-10-24 DIAGNOSIS — M792 Neuralgia and neuritis, unspecified: Secondary | ICD-10-CM | POA: Insufficient documentation

## 2021-10-24 DIAGNOSIS — I10 Essential (primary) hypertension: Secondary | ICD-10-CM | POA: Insufficient documentation

## 2021-10-27 NOTE — Progress Notes (Signed)
° °   °  [address]       [place], [address]             [phone]        Dav[place]e E [address] [place] Medical Record #[id number] Date of Birth: Aug 20, 1961  Referring: Horald Pollen, * Primary Care: Horald Pollen, MD Primary Cardiologist:Traci Radford Pax, MD  Reason for visit:   follow-up  History of Present Illness:     Mrs. Hinks presents for her 1 month follow-up appointment.  She is doing quite well.  She denies any chest pain or shortness of breath.  Her energy is doing quite well.  Physical Exam: BP 117/70    Pulse 95    Resp 20 Comment: RA   Ht 5\' 5"  (1.651 m)    Wt 219 lb (99.3 kg)    SpO2 96%    BMI 36.44 kg/m   Alert NAD Incision clean, well-healed.  Abdomen ND No peripheral edema   Diagnostic Studies & Laboratory data: CXR: Clear     Assessment / Plan:   60 year old female status post redo right robotic assisted thoracoscopy with wedge resection of the right lower lobe for carcinoid tumor.  She is doing quite well.  We will continue surveillance with her.  I will see him back in 6 months with another CT chest   Lajuana Matte 10/27/2021 1:41 PM

## 2021-11-07 ENCOUNTER — Other Ambulatory Visit: Payer: Self-pay

## 2021-11-07 ENCOUNTER — Ambulatory Visit
Admission: RE | Admit: 2021-11-07 | Discharge: 2021-11-07 | Disposition: A | Discharge status: Home / Self Care | Payer: 59 | Source: Ambulatory Visit | Attending: Emergency Medicine | Admitting: Emergency Medicine

## 2021-11-07 DIAGNOSIS — G8929 Other chronic pain: Secondary | ICD-10-CM

## 2021-11-07 DIAGNOSIS — S83242A Other tear of medial meniscus, current injury, left knee, initial encounter: Secondary | ICD-10-CM | POA: Diagnosis not present

## 2021-11-13 ENCOUNTER — Other Ambulatory Visit: Payer: Self-pay | Admitting: Emergency Medicine

## 2021-11-13 MED ORDER — ROSUVASTATIN CALCIUM 10 MG PO TABS
10.0000 mg | ORAL_TABLET | Freq: Every day | ORAL | 3 refills | Status: DC
  Filled 2021-11-13: qty 90, 90d supply, fill #0
  Filled 2022-02-26: qty 90, 90d supply, fill #1
  Filled 2022-05-20: qty 90, 90d supply, fill #2
  Filled 2022-08-26: qty 90, 90d supply, fill #3

## 2021-11-13 MED ORDER — DULOXETINE HCL 30 MG PO CPEP
30.0000 mg | ORAL_CAPSULE | Freq: Two times a day (BID) | ORAL | 3 refills | Status: DC
Start: 2021-11-13 — End: 2022-02-26
  Filled 2021-11-13: qty 180, 90d supply, fill #0

## 2021-11-14 ENCOUNTER — Telehealth: Payer: 59 | Admitting: Thoracic Surgery (Cardiothoracic Vascular Surgery)

## 2021-11-14 ENCOUNTER — Other Ambulatory Visit (HOSPITAL_COMMUNITY): Payer: Self-pay

## 2021-11-14 ENCOUNTER — Encounter: Payer: Self-pay | Admitting: Physical Therapy

## 2021-11-14 NOTE — Therapy (Signed)
[facility] [address] [place], [place], [address] Phone: [phone]   Fax:  [phone]  Physical Therapy Evaluation  Patient Details  Name: Patricia Mathews MRN: [id number] Date of Birth: 1961-08-02 Referring Provider (PT): Lynne Leader   Encounter Date: 09/01/2021   PT End of Session - 11/14/21 1214     Visit Number 1    Number of Visits 12    Date for PT Re-Evaluation 10/13/21    Authorization Type UMR    PT Start Time 1102    PT Stop Time 1140    PT Time Calculation (min) 38 min    Activity Tolerance Patient tolerated treatment well    Behavior During Therapy [facility] for tasks assessed/performed             Past Medical History:  Diagnosis Date   Dyspnea    Dysrhythmia    Endometrial polyp    Family history of adverse reaction to anesthesia    Patient states her mother had an adverse reaction to anesthesia but she is not sure exactly what it was   Fibromyalgia    GAD (generalized anxiety disorder)    History of panic attacks    Hypertension    followed by pcp   Medical history non-contributory    Neuropathy    OSA on CPAP    followed by dr dohmeier--- per study 02-02-2018 moderate to severe osa   Right bundle branch block    Sjogren's disease ([place])    Systemic lupus ([place])    rheumonotologist-- dr Page Spiro    Past Surgical History:  Procedure Laterality Date   [place];  1989   COLONOSCOPY WITH PROPOFOL  10/2012   DILATATION & CURETTAGE/HYSTEROSCOPY WITH MYOSURE N/A 11/05/2020   Procedure: [place];  Surgeon: Princess Bruins, MD;  Location: [facility];  Service: Gynecology;  Laterality: N/A;  request to follow in [place] block time ~10:00am requests one hour   FIDUCIAL MARKER PLACEMENT  09/11/2021   Procedure: FIDUCIAL DYE MARKING;  Surgeon: Garner Nash, DO;  Location: [facility];  Service: Pulmonary;;   INTERCOSTAL  NERVE BLOCK Right 03/26/2021   Procedure: INTERCOSTAL NERVE BLOCK;  Surgeon: Lajuana Matte, MD;  Location: [facility];  Service: Thoracic;  Laterality: Right;   INTERCOSTAL NERVE BLOCK Right 09/11/2021   Procedure: INTERCOSTAL NERVE BLOCK;  Surgeon: Lajuana Matte, MD;  Location: [facility];  Service: Thoracic;  Laterality: Right;   LYMPH NODE DISSECTION  03/26/2021   Procedure: LYMPH NODE DISSECTION;  Surgeon: Lajuana Matte, MD;  Location: [facility];  Service: Thoracic;;   LYMPH NODE DISSECTION Right 09/11/2021   Procedure: LYMPH NODE DISSECTION;  Surgeon: Lajuana Matte, MD;  Location: [facility];  Service: Thoracic;  Laterality: Right;   thorascopy Right    Robotic Assisted   VIDEO BRONCHOSCOPY WITH ENDOBRONCHIAL NAVIGATION N/A 09/11/2021   Procedure: VIDEO BRONCHOSCOPY WITH ENDOBRONCHIAL NAVIGATION;  Surgeon: Garner Nash, DO;  Location: [facility];  Service: Pulmonary;  Laterality: N/A;    There were no vitals filed for this visit.    Subjective Assessment - 11/14/21 1217     Subjective L knee pain, for a few months,  tripped and fell outside 2x a couple months ago. Did have injection at  end of September, thinks it helped about 50 %. Then fell this past weekend., does not feel increased pain from that.  Doing better, but bike and driving still hurt.   (  82moago cancer surgery) . Had recent lung nodule removed. Also is wearing heart monitor, states heart fluttering for months. will wear for 30 days, no limitations.    Pertinent History lupus, sjogrens, current heart monitor    Patient Stated Goals decreased L knee pain.    Currently in Pain? Yes    Pain Score 5     Pain Location Knee    Pain Orientation Left    Pain Descriptors / Indicators Aching    Pain Type Acute pain    Pain Onset More than a month ago    Pain Frequency Intermittent    Aggravating Factors  bike, stairs, driving                [facility] PT Assessment - 11/14/21 0001       Assessment   Medical  Diagnosis L knee pain    Referring Provider (PT) ELynne Leader   Prior Therapy no      Precautions   Precautions None      Balance Screen   Has the patient fallen in the past 6 months No      Prior Function   Level of Independence Independent      Cognition   Overall Cognitive Status Within Functional Limits for tasks assessed      AROM   Overall AROM Comments L knee: Extension: WNL, Flexion: mild limitation at end range due to pain.      Strength   Overall Strength Comments L knee: 4+/5, Hips: 4/5      Palpation   Palpation comment Signfiicant tenderness in posterior knee, popliteal fossa, as well as into distal hamstring, swelling at posterior knee.                        Objective measurements completed on examination: See above findings.       [facility] - 11/14/21 0001       Exercises   Exercises Knee/Hip      Knee/Hip Exercises: Stretches   Active Hamstring Stretch 3 reps;30 seconds    Active Hamstring Stretch Limitations seated      Knee/Hip Exercises: Seated   Long Arc Quad 15 reps    Long Arc Quad Weight 2 lbs.      Knee/Hip Exercises: Supine   Quad Sets 10 reps    Straight Leg Raises 10 reps;Left      Manual Therapy   Manual Therapy Soft tissue mobilization    Soft tissue mobilization STM/ TPR to distal hamstring.                     PT Education - 11/14/21 1219     Education Details PT POC, Exam findings, HEP    Person(s) Educated Patient    Methods Explanation;Demonstration;Tactile cues;Verbal cues;Handout    Comprehension Verbalized understanding;Returned demonstration;Verbal cues required;Tactile cues required;Need further instruction              PT Short Term Goals - 11/14/21 1219       PT SHORT TERM GOAL #1   Title Pt to be independent with intial HEP    Time 2    Period Weeks    Status New    Target Date 09/15/21               PT Long Term Goals - 11/14/21 1219        PT LONG TERM GOAL #1   Title Pt  to be independent with final HEP    Time 6    Period Weeks    Status New    Target Date 10/13/21      PT LONG TERM GOAL #2   Title Pt to report improved strength in knee and hips to 5/5, to improve stability and ability for functional activity.    Time 6    Period Weeks    Status New    Target Date 10/13/21      PT LONG TERM GOAL #3   Title Pt to demo ability for stair climbing to be painfree, with recipricol pattern, to improve ability for community activity.    Time 6    Period Weeks    Status New    Target Date 10/13/21      PT LONG TERM GOAL #4   Title Pt to report ability for at least 30 min of exercise and standing activity, with pain in knee less than 2/10, to imrpove ability for exercise and commnity activities.    Time 6    Period Weeks    Status New    Target Date 10/13/21                    Plan - 11/14/21 1224     Clinical Impression Statement Pt presents with primary complaint of increased pain in L knee. She has most pain in posterior knee, consistent with meniscal patholgy, and also has some tenderness into hamstring. She has pain wtih full ROM, and weakness in knee and hip. Pt with decreased ability for full functional activites, walking, stairs, and exercise. Pt to benefit from skilled PT to improve deficits and pain.    Personal Factors and Comorbidities Comorbidity 1    Comorbidities lupus, sjogrens    Examination-Activity Limitations Stand;Bend;Squat;Stairs    Examination-Participation Restrictions Cleaning;Meal Prep;Yard Work;Shop;Community Activity    Stability/Clinical Decision Making Stable/Uncomplicated    Clinical Decision Making Low    Rehab Potential Good    PT Frequency 2x / week    PT Duration 6 weeks    PT Treatment/Interventions ADLs/Self Care Home Management;Canalith Repostioning;Cryotherapy;Traction;Moist Heat;Iontophoresis 21m/ml Dexamethasone;DME Instruction;Gait training;Stair  training;Functional mobility training;Therapeutic activities;Therapeutic exercise;Patient/family education;Balance training;Neuromuscular re-education;Dry needling;Taping;Passive range of motion;Spinal Manipulations;Joint Manipulations    PT Home Exercise Plan [id number]    Consulted and Agree with Plan of Care Patient             Patient will benefit from skilled therapeutic intervention in order to improve the following deficits and impairments:  Abnormal gait, Pain, Improper body mechanics, Decreased mobility, Increased muscle spasms, Decreased strength, Decreased range of motion, Decreased activity tolerance  Visit Diagnosis: Acute pain of left knee     Problem List Patient Active Problem List   Diagnosis Date Noted   Dyslipidemia 10/24/2021   Hypertension 10/24/2021   Neuropathic pain 10/24/2021   S/P partial lobectomy of lung 09/11/2021   Carcinoid tumor    Malignant carcinoid tumor of the bronchus and lung ([place] 04/18/2021   Drug induced constipation 04/18/2021   Right lower lobe pulmonary nodule 03/26/2021   Essential hypertension, benign 12/25/2019   Other forms of systemic lupus erythematosus ([place] 12/25/2019   Sjogren's syndrome ([place] 12/25/2019   GAD (generalized anxiety disorder) 12/25/2019   Obstructive sleep apnea treated with continuous positive airway pressure (CPAP) 09/01/2018   Sleep related headaches 01/03/2018   Snoring 01/03/2018   Hypersomnia with sleep apnea 01/03/2018   Arthralgia 01/03/2018   Multiple neurological symptoms 12/28/2017  Lyndee Hensen, PT, DPT 1:19 PM  11/14/21    [facility], [place], [address] Phone: [phone]   Fax:  [phone]  Name: Patricia Mathews MRN: [id number] Date of Birth: January 30, 1961  PHYSICAL THERAPY DISCHARGE SUMMARY  Visits from Start of Care: 1 Plan: Patient agrees to discharge.  Patient goals were not met. Patient is being  discharged due to- not returning since Eval.    Lyndee Hensen, PT, DPT 1:20 PM  11/14/21

## 2021-11-21 ENCOUNTER — Ambulatory Visit (HOSPITAL_BASED_OUTPATIENT_CLINIC_OR_DEPARTMENT_OTHER): Payer: Self-pay | Admitting: Orthopaedic Surgery

## 2021-11-21 ENCOUNTER — Other Ambulatory Visit (HOSPITAL_BASED_OUTPATIENT_CLINIC_OR_DEPARTMENT_OTHER): Payer: Self-pay

## 2021-11-21 ENCOUNTER — Other Ambulatory Visit: Payer: Self-pay

## 2021-11-21 ENCOUNTER — Ambulatory Visit (INDEPENDENT_AMBULATORY_CARE_PROVIDER_SITE_OTHER): Payer: 59 | Admitting: Orthopaedic Surgery

## 2021-11-21 DIAGNOSIS — G8929 Other chronic pain: Secondary | ICD-10-CM

## 2021-11-21 DIAGNOSIS — M25562 Pain in left knee: Secondary | ICD-10-CM | POA: Diagnosis not present

## 2021-11-21 DIAGNOSIS — S83241A Other tear of medial meniscus, current injury, right knee, initial encounter: Secondary | ICD-10-CM

## 2021-11-21 MED ORDER — OXYCODONE HCL 5 MG PO TABS
5.0000 mg | ORAL_TABLET | ORAL | 0 refills | Status: DC | PRN
  Filled 2021-11-21: qty 20, 4d supply, fill #0

## 2021-11-21 MED ORDER — ACETAMINOPHEN 500 MG PO TABS
500.0000 mg | ORAL_TABLET | Freq: Three times a day (TID) | ORAL | 0 refills | Status: AC
  Filled 2021-11-21: qty 30, 10d supply, fill #0

## 2021-11-21 MED ORDER — IBUPROFEN 800 MG PO TABS
800.0000 mg | ORAL_TABLET | Freq: Three times a day (TID) | ORAL | 0 refills | Status: AC
  Filled 2021-11-21: qty 30, 10d supply, fill #0

## 2021-11-21 MED ORDER — ASPIRIN EC 325 MG PO TBEC
325.0000 mg | DELAYED_RELEASE_TABLET | Freq: Every day | ORAL | 0 refills | Status: DC
  Filled 2021-11-21: qty 30, 30d supply, fill #0

## 2021-11-21 NOTE — H&P (View-Only) (Signed)
Chief Complaint: left knee     History of Present Illness:    Patricia Mathews is a 61 y.o. female very pleasant presents with left knee pain going on now for over a year.  She states that she did have a fall 5 years prior which resulted in knee pain but seem to resolve over time.  She states that the pain has been progressively worse over the last year.  This is predominantly worse in the posterior medial joint.  She is having pain significantly at night with very limited range of motion on awakening.  She did have an injection previously this year which gave her approximately 1 week of very good relief.  She is taking Tylenol and ibuprofen.  Her husband works as an Sales promotion account executive.  They have several children that are not out of the house.  She likes to go hiking and stay active although this has been extremely limited as a result of her knee pain.  She has tried physical therapy in the past with very limited relief.   Surgical History:   None  PMH/PSH/Family History/Social History/Meds/Allergies:    Past Medical History:  Diagnosis Date   Dyspnea    Dysrhythmia    Endometrial polyp    Family history of adverse reaction to anesthesia    Patient states her mother had an adverse reaction to anesthesia but she is not sure exactly what it was   Fibromyalgia    GAD (generalized anxiety disorder)    History of panic attacks    Hypertension    followed by pcp   Medical history non-contributory    Neuropathy    OSA on CPAP    followed by dr dohmeier--- per study 02-02-2018 moderate to severe osa   Right bundle branch block    Sjogren's disease ([place])    Systemic lupus ([place])    rheumonotologist-- dr Page Spiro   Past Surgical History:  Procedure Laterality Date   [place];  1989   COLONOSCOPY WITH PROPOFOL  10/2012   DILATATION & CURETTAGE/HYSTEROSCOPY WITH MYOSURE N/A 11/05/2020   Procedure: [place];  Surgeon: Princess Bruins, MD;  Location: [place];  Service: Gynecology;  Laterality: N/A;  request to follow in [place] block time ~10:00am requests one hour   FIDUCIAL MARKER PLACEMENT  09/11/2021   Procedure: FIDUCIAL DYE MARKING;  Surgeon: Garner Nash, DO;  Location: [facility];  Service: Pulmonary;;   INTERCOSTAL NERVE BLOCK Right 03/26/2021   Procedure: INTERCOSTAL NERVE BLOCK;  Surgeon: Lajuana Matte, MD;  Location: [place];  Service: Thoracic;  Laterality: Right;   INTERCOSTAL NERVE BLOCK Right 09/11/2021   Procedure: INTERCOSTAL NERVE BLOCK;  Surgeon: Lajuana Matte, MD;  Location: [facility];  Service: Thoracic;  Laterality: Right;   LYMPH NODE DISSECTION  03/26/2021   Procedure: LYMPH NODE DISSECTION;  Surgeon: Lajuana Matte, MD;  Location: [facility];  Service: Thoracic;;   LYMPH NODE DISSECTION Right 09/11/2021   Procedure: LYMPH NODE DISSECTION;  Surgeon: Lajuana Matte, MD;  Location: [facility];  Service: Thoracic;  Laterality: Right;   thorascopy Right    Robotic Assisted   VIDEO BRONCHOSCOPY WITH ENDOBRONCHIAL NAVIGATION N/A 09/11/2021   Procedure: VIDEO BRONCHOSCOPY WITH ENDOBRONCHIAL NAVIGATION;  Surgeon: Garner Nash,  DO;  Location: [facility];  Service: Pulmonary;  Laterality: N/A;   Social History   Socioeconomic History   Marital status: Married    Spouse name: Not on file   Number of children: 2   Years of education: post-grad   Highest education level: Not on file  Occupational History   Not on file  Tobacco Use   Smoking status: Former    Years: 10.00    Types: Cigarettes    Quit date: 1989    Years since quitting: 34.0   Smokeless tobacco: Never  Vaping Use   Vaping Use: Never used  Substance and Sexual Activity   Alcohol use: Yes    Comment: occasional- social   Drug use: Never   Sexual activity: Yes    Partners: Male    Birth control/protection: Post-menopausal     Comment: 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65, PARTNERS- 3, MARRIED- 43 YRS   Other Topics Concern   Not on file  Social History Narrative   Lives at home with her husband   Right handed   2 cups of caffeine daily   Social Determinants of Health   Financial Resource Strain: Not on file  Food Insecurity: Not on file  Transportation Needs: Not on file  Physical Activity: Not on file  Stress: Not on file  Social Connections: Not on file   Family History  Problem Relation Age of Onset   Hypertension Mother    Osteoarthritis Mother    Cancer Father        PROSTATE   Hypertension Father    Heart failure Father    Lung disease Father    Hypertension Sister    Lupus Sister    Cancer Brother        PROSTATE   Lupus Brother    Hypertension Brother    Hypertension Brother    Hypertension Brother    Sarcoidosis Paternal Aunt    Breast cancer Neg Hx    Allergies  Allergen Reactions   Penicillins Anaphylaxis   Current Outpatient Medications  Medication Sig Dispense Refill   acetaminophen (TYLENOL) 500 MG tablet Take 1,000 mg by mouth every 6 (six) hours as needed for moderate pain or headache.     ALPRAZolam (XANAX) 0.25 MG tablet Take 0.25 mg by mouth 2 (two) times daily as needed for anxiety.     Cyanocobalamin (B-12) 3000 MCG CAPS Take 3,000 mg by mouth daily.     DULoxetine (CYMBALTA) 30 MG capsule Take 1 capsule (30 mg total) by mouth 2 (two) times daily. 180 capsule 3   fluticasone (FLOVENT HFA) 110 MCG/ACT inhaler Inhale 2 puffs into the lungs daily. (Patient not taking: Reported on 09/19/2021) 12 g 3   HYDROcodone-acetaminophen (NORCO/VICODIN) 5-325 MG tablet Take 1 tablet by mouth every 4 (four) hours as needed for moderate pain. (Patient not taking: Reported on 10/24/2021) 30 tablet 0   losartan-hydrochlorothiazide (HYZAAR) 50-12.5 MG tablet TAKE 1 TABLET BY MOUTH DAILY. 90 tablet 3   MELATONIN PO Take 1 capsule by mouth at bedtime as needed (sleep).     METAMUCIL FIBER PO Take 1  Scoop by mouth daily as needed (constipation).     OVER THE COUNTER MEDICATION Take 2 tablets by mouth daily. Olly digestive enzyme blend     pregabalin (LYRICA) 25 MG capsule Take 1 capsule (25 mg total) by mouth 2 (two) times daily. (Patient not taking: Reported on 10/24/2021) 60 capsule 0   Respiratory Therapy Supplies (CARETOUCH 2 CPAP HOSE HANGER) MISC by  Does not apply route.     rosuvastatin (CRESTOR) 10 MG tablet Take 1 tablet (10 mg total) by mouth daily. 90 tablet 3   No current facility-administered medications for this visit.   No results found.  Review of Systems:   A ROS was performed including pertinent positives and negatives as documented in the HPI.  Physical Exam :   Constitutional: NAD and appears stated age Neurological: Alert and oriented Psych: Appropriate affect and cooperative There were no vitals taken for this visit.   Comprehensive Musculoskeletal Exam:      Musculoskeletal Exam  Gait Normal  Alignment Normal   Right Left  Inspection Normal Normal  Palpation    Tenderness None Posterior medial posterior lateral over Baker's cyst  Crepitus None None  Effusion None Mild  Range of Motion    Extension 0 0  Flexion 135 135 with pain  Strength    Extension 5/5 5/5  Flexion 5/5 5/5  Ligament Exam     Generalized Laxity No No  Lachman Negative Negative   Pivot Shift Negative Negative  Anterior Drawer Negative Negative  Valgus at 0 Negative Negative  Valgus at 20 Negative Negative  Varus at 0 0 0  Varus at 20   0 0  Posterior Drawer at 90 0 0  Vascular/Lymphatic Exam    Edema None None  Venous Stasis Changes No No  Distal Circulation Normal Normal  Neurologic    Light Touch Sensation Intact Intact  Special Tests:      Imaging:    MRI (left knee): There is a posterior medial meniscal root tear with focal cartilage loss involving the medial femoral condyle and subchondral bruising  I personally reviewed and interpreted the  radiographs.   Assessment:   61 year old female with a left posterior medial meniscal root tear and subsequent chondral wear involving the medial joint.  I did describe with her that overall I do believe her outcome would have been optimized if this route had been addressed likely during the original injury 5 years prior.  I described that she is subsequently developed chondral loss involving the medial femoral condyle.  That being said her disease is fairly localized to the medial joint.  I do not believe that she would be a candidate for arthroplasty at this time.  She has had an injection which did not help for more than a week and physical therapy which was of minimal efficacy.  I described that I do believe that she would be a candidate for a medial meniscal root repair as well as abrasion arthroplasty of the femoral condyle although I discussed that this would be associated with nonweightbearing postoperative as well as a longer rehab.  I described that our goal would be to improve her quality of life but we discussed that given this focal cartilage loss that it is unlikely for her to have complete pain relief.  She understands this and would like to proceed with the procedure in order to optimize her knee in any capacity.  Plan :    -Plan for left knee arthroscopy with medial meniscal root repair and medial femoral chondroplasty    After a lengthy discussion of treatment options, including risks, benefits, alternatives, complications of surgical and nonsurgical conservative options, the patient elected surgical repair.   The patient  is aware of the material risks  and complications including, but not limited to injury to adjacent structures, neurovascular injury, infection, numbness, bleeding, implant failure, thermal burns, stiffness, persistent pain, failure  to heal, disease transmission from allograft, need for further surgery, dislocation, anesthetic risks, blood clots, risks of death,and  others. The probabilities of surgical success and failure discussed with patient given their particular co-morbidities.The time and nature of expected rehabilitation and recovery was discussed.The patient's questions were all answered preoperatively.  No barriers to understanding were noted. I explained the natural history of the disease process and Rx rationale.  I explained to the patient what I considered to be reasonable expectations given their personal situation.  The final treatment plan was arrived at through a shared patient decision making process model.       I personally saw and evaluated the patient, and participated in the management and treatment plan.  Vanetta Mulders, MD Attending Physician, Orthopedic Surgery  This document was dictated using Dragon voice recognition software. A reasonable attempt at proof reading has been made to minimize errors.

## 2021-11-21 NOTE — Progress Notes (Signed)
Chief Complaint: left knee     History of Present Illness:    Patricia Mathews is a 61 y.o. female very pleasant presents with left knee pain going on now for over a year.  She states that she did have a fall 5 years prior which resulted in knee pain but seem to resolve over time.  She states that the pain has been progressively worse over the last year.  This is predominantly worse in the posterior medial joint.  She is having pain significantly at night with very limited range of motion on awakening.  She did have an injection previously this year which gave her approximately 1 week of very good relief.  She is taking Tylenol and ibuprofen.  Her husband works as an Sales promotion account executive.  They have several children that are not out of the house.  She likes to go hiking and stay active although this has been extremely limited as a result of her knee pain.  She has tried physical therapy in the past with very limited relief.   Surgical History:   None  PMH/PSH/Family History/Social History/Meds/Allergies:    Past Medical History:  Diagnosis Date   Dyspnea    Dysrhythmia    Endometrial polyp    Family history of adverse reaction to anesthesia    Patient states her mother had an adverse reaction to anesthesia but she is not sure exactly what it was   Fibromyalgia    GAD (generalized anxiety disorder)    History of panic attacks    Hypertension    followed by pcp   Medical history non-contributory    Neuropathy    OSA on CPAP    followed by dr dohmeier--- per study 02-02-2018 moderate to severe osa   Right bundle branch block    Sjogren's disease ([place])    Systemic lupus ([place])    rheumonotologist-- dr Page Spiro   Past Surgical History:  Procedure Laterality Date   [place];  1989   COLONOSCOPY WITH PROPOFOL  10/2012   DILATATION & CURETTAGE/HYSTEROSCOPY WITH MYOSURE N/A 11/05/2020   Procedure: [place];  Surgeon: Princess Bruins, MD;  Location: [facility];  Service: Gynecology;  Laterality: N/A;  request to follow in [place] block time ~10:00am requests one hour   FIDUCIAL MARKER PLACEMENT  09/11/2021   Procedure: FIDUCIAL DYE MARKING;  Surgeon: Garner Nash, DO;  Location: [facility];  Service: Pulmonary;;   INTERCOSTAL NERVE BLOCK Right 03/26/2021   Procedure: INTERCOSTAL NERVE BLOCK;  Surgeon: Lajuana Matte, MD;  Location: [facility];  Service: Thoracic;  Laterality: Right;   INTERCOSTAL NERVE BLOCK Right 09/11/2021   Procedure: INTERCOSTAL NERVE BLOCK;  Surgeon: Lajuana Matte, MD;  Location: [facility];  Service: Thoracic;  Laterality: Right;   LYMPH NODE DISSECTION  03/26/2021   Procedure: LYMPH NODE DISSECTION;  Surgeon: Lajuana Matte, MD;  Location: [facility];  Service: Thoracic;;   LYMPH NODE DISSECTION Right 09/11/2021   Procedure: LYMPH NODE DISSECTION;  Surgeon: Lajuana Matte, MD;  Location: [facility];  Service: Thoracic;  Laterality: Right;   thorascopy Right    Robotic Assisted   VIDEO BRONCHOSCOPY WITH ENDOBRONCHIAL NAVIGATION N/A 09/11/2021   Procedure: VIDEO BRONCHOSCOPY WITH ENDOBRONCHIAL NAVIGATION;  Surgeon: Garner Nash,  DO;  Location: [facility];  Service: Pulmonary;  Laterality: N/A;   Social History   Socioeconomic History   Marital status: Married    Spouse name: Not on file   Number of children: 2   Years of education: post-grad   Highest education level: Not on file  Occupational History   Not on file  Tobacco Use   Smoking status: Former    Years: 10.00    Types: Cigarettes    Quit date: 1989    Years since quitting: 34.0   Smokeless tobacco: Never  Vaping Use   Vaping Use: Never used  Substance and Sexual Activity   Alcohol use: Yes    Comment: occasional- social   Drug use: Never   Sexual activity: Yes    Partners: Male    Birth control/protection: Post-menopausal     Comment: 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72, PARTNERS- 3, MARRIED- 38 YRS   Other Topics Concern   Not on file  Social History Narrative   Lives at home with her husband   Right handed   2 cups of caffeine daily   Social Determinants of Health   Financial Resource Strain: Not on file  Food Insecurity: Not on file  Transportation Needs: Not on file  Physical Activity: Not on file  Stress: Not on file  Social Connections: Not on file   Family History  Problem Relation Age of Onset   Hypertension Mother    Osteoarthritis Mother    Cancer Father        PROSTATE   Hypertension Father    Heart failure Father    Lung disease Father    Hypertension Sister    Lupus Sister    Cancer Brother        PROSTATE   Lupus Brother    Hypertension Brother    Hypertension Brother    Hypertension Brother    Sarcoidosis Paternal Aunt    Breast cancer Neg Hx    Allergies  Allergen Reactions   Penicillins Anaphylaxis   Current Outpatient Medications  Medication Sig Dispense Refill   acetaminophen (TYLENOL) 500 MG tablet Take 1,000 mg by mouth every 6 (six) hours as needed for moderate pain or headache.     ALPRAZolam (XANAX) 0.25 MG tablet Take 0.25 mg by mouth 2 (two) times daily as needed for anxiety.     Cyanocobalamin (B-12) 3000 MCG CAPS Take 3,000 mg by mouth daily.     DULoxetine (CYMBALTA) 30 MG capsule Take 1 capsule (30 mg total) by mouth 2 (two) times daily. 180 capsule 3   fluticasone (FLOVENT HFA) 110 MCG/ACT inhaler Inhale 2 puffs into the lungs daily. (Patient not taking: Reported on 09/19/2021) 12 g 3   HYDROcodone-acetaminophen (NORCO/VICODIN) 5-325 MG tablet Take 1 tablet by mouth every 4 (four) hours as needed for moderate pain. (Patient not taking: Reported on 10/24/2021) 30 tablet 0   losartan-hydrochlorothiazide (HYZAAR) 50-12.5 MG tablet TAKE 1 TABLET BY MOUTH DAILY. 90 tablet 3   MELATONIN PO Take 1 capsule by mouth at bedtime as needed (sleep).     METAMUCIL FIBER PO Take 1  Scoop by mouth daily as needed (constipation).     OVER THE COUNTER MEDICATION Take 2 tablets by mouth daily. Olly digestive enzyme blend     pregabalin (LYRICA) 25 MG capsule Take 1 capsule (25 mg total) by mouth 2 (two) times daily. (Patient not taking: Reported on 10/24/2021) 60 capsule 0   Respiratory Therapy Supplies (CARETOUCH 2 CPAP HOSE HANGER) MISC by  Does not apply route.     rosuvastatin (CRESTOR) 10 MG tablet Take 1 tablet (10 mg total) by mouth daily. 90 tablet 3   No current facility-administered medications for this visit.   No results found.  Review of Systems:   A ROS was performed including pertinent positives and negatives as documented in the HPI.  Physical Exam :   Constitutional: NAD and appears stated age Neurological: Alert and oriented Psych: Appropriate affect and cooperative There were no vitals taken for this visit.   Comprehensive Musculoskeletal Exam:      Musculoskeletal Exam  Gait Normal  Alignment Normal   Right Left  Inspection Normal Normal  Palpation    Tenderness None Posterior medial posterior lateral over Baker's cyst  Crepitus None None  Effusion None Mild  Range of Motion    Extension 0 0  Flexion 135 135 with pain  Strength    Extension 5/5 5/5  Flexion 5/5 5/5  Ligament Exam     Generalized Laxity No No  Lachman Negative Negative   Pivot Shift Negative Negative  Anterior Drawer Negative Negative  Valgus at 0 Negative Negative  Valgus at 20 Negative Negative  Varus at 0 0 0  Varus at 20   0 0  Posterior Drawer at 90 0 0  Vascular/Lymphatic Exam    Edema None None  Venous Stasis Changes No No  Distal Circulation Normal Normal  Neurologic    Light Touch Sensation Intact Intact  Special Tests:      Imaging:    MRI (left knee): There is a posterior medial meniscal root tear with focal cartilage loss involving the medial femoral condyle and subchondral bruising  I personally reviewed and interpreted the  radiographs.   Assessment:   61 year old female with a left posterior medial meniscal root tear and subsequent chondral wear involving the medial joint.  I did describe with her that overall I do believe her outcome would have been optimized if this route had been addressed likely during the original injury 5 years prior.  I described that she is subsequently developed chondral loss involving the medial femoral condyle.  That being said her disease is fairly localized to the medial joint.  I do not believe that she would be a candidate for arthroplasty at this time.  She has had an injection which did not help for more than a week and physical therapy which was of minimal efficacy.  I described that I do believe that she would be a candidate for a medial meniscal root repair as well as abrasion arthroplasty of the femoral condyle although I discussed that this would be associated with nonweightbearing postoperative as well as a longer rehab.  I described that our goal would be to improve her quality of life but we discussed that given this focal cartilage loss that it is unlikely for her to have complete pain relief.  She understands this and would like to proceed with the procedure in order to optimize her knee in any capacity.  Plan :    -Plan for left knee arthroscopy with medial meniscal root repair and medial femoral chondroplasty    After a lengthy discussion of treatment options, including risks, benefits, alternatives, complications of surgical and nonsurgical conservative options, the patient elected surgical repair.   The patient  is aware of the material risks  and complications including, but not limited to injury to adjacent structures, neurovascular injury, infection, numbness, bleeding, implant failure, thermal burns, stiffness, persistent pain, failure  to heal, disease transmission from allograft, need for further surgery, dislocation, anesthetic risks, blood clots, risks of death,and  others. The probabilities of surgical success and failure discussed with patient given their particular co-morbidities.The time and nature of expected rehabilitation and recovery was discussed.The patient's questions were all answered preoperatively.  No barriers to understanding were noted. I explained the natural history of the disease process and Rx rationale.  I explained to the patient what I considered to be reasonable expectations given their personal situation.  The final treatment plan was arrived at through a shared patient decision making process model.       I personally saw and evaluated the patient, and participated in the management and treatment plan.  Vanetta Mulders, MD Attending Physician, Orthopedic Surgery  This document was dictated using Dragon voice recognition software. A reasonable attempt at proof reading has been made to minimize errors.

## 2021-12-02 ENCOUNTER — Other Ambulatory Visit: Payer: Self-pay

## 2021-12-02 ENCOUNTER — Encounter (HOSPITAL_BASED_OUTPATIENT_CLINIC_OR_DEPARTMENT_OTHER): Payer: Self-pay | Admitting: Orthopaedic Surgery

## 2021-12-04 ENCOUNTER — Other Ambulatory Visit: Payer: Self-pay | Admitting: Emergency Medicine

## 2021-12-04 ENCOUNTER — Other Ambulatory Visit (HOSPITAL_COMMUNITY): Payer: Self-pay

## 2021-12-04 DIAGNOSIS — K581 Irritable bowel syndrome with constipation: Secondary | ICD-10-CM

## 2021-12-04 DIAGNOSIS — I10 Essential (primary) hypertension: Secondary | ICD-10-CM

## 2021-12-04 MED ORDER — LOSARTAN POTASSIUM-HCTZ 50-12.5 MG PO TABS
1.0000 | ORAL_TABLET | Freq: Every day | ORAL | 3 refills | Status: DC
  Filled 2021-12-04: qty 90, 90d supply, fill #0
  Filled 2022-02-26: qty 90, 90d supply, fill #1
  Filled 2022-05-20: qty 90, 90d supply, fill #2
  Filled 2022-08-26: qty 90, 90d supply, fill #3

## 2021-12-04 MED ORDER — LINACLOTIDE 145 MCG PO CAPS
145.0000 ug | ORAL_CAPSULE | Freq: Every day | ORAL | 2 refills | Status: DC
  Filled 2021-12-04: qty 30, 30d supply, fill #0
  Filled 2022-05-20: qty 30, 30d supply, fill #1
  Filled 2022-09-27: qty 30, 30d supply, fill #2

## 2021-12-05 ENCOUNTER — Encounter (HOSPITAL_BASED_OUTPATIENT_CLINIC_OR_DEPARTMENT_OTHER)
Admission: RE | Admit: 2021-12-05 | Discharge: 2021-12-05 | Disposition: A | Discharge status: Home / Self Care | Payer: 59 | Source: Ambulatory Visit | Attending: Orthopaedic Surgery | Admitting: Orthopaedic Surgery

## 2021-12-05 ENCOUNTER — Ambulatory Visit
Admission: RE | Admit: 2021-12-05 | Discharge: 2021-12-05 | Disposition: A | Discharge status: Home / Self Care | Payer: 59 | Source: Ambulatory Visit | Attending: Emergency Medicine | Admitting: Emergency Medicine

## 2021-12-05 DIAGNOSIS — E669 Obesity, unspecified: Secondary | ICD-10-CM | POA: Diagnosis not present

## 2021-12-05 DIAGNOSIS — I358 Other nonrheumatic aortic valve disorders: Secondary | ICD-10-CM | POA: Diagnosis not present

## 2021-12-05 DIAGNOSIS — W19XXXA Unspecified fall, initial encounter: Secondary | ICD-10-CM | POA: Diagnosis not present

## 2021-12-05 DIAGNOSIS — M797 Fibromyalgia: Secondary | ICD-10-CM | POA: Diagnosis not present

## 2021-12-05 DIAGNOSIS — Z6838 Body mass index (BMI) 38.0-38.9, adult: Secondary | ICD-10-CM | POA: Diagnosis not present

## 2021-12-05 DIAGNOSIS — Z87891 Personal history of nicotine dependence: Secondary | ICD-10-CM | POA: Diagnosis not present

## 2021-12-05 DIAGNOSIS — Z1231 Encounter for screening mammogram for malignant neoplasm of breast: Secondary | ICD-10-CM | POA: Diagnosis not present

## 2021-12-05 DIAGNOSIS — M6752 Plica syndrome, left knee: Secondary | ICD-10-CM | POA: Diagnosis not present

## 2021-12-05 DIAGNOSIS — M659 Synovitis and tenosynovitis, unspecified: Secondary | ICD-10-CM | POA: Diagnosis not present

## 2021-12-05 DIAGNOSIS — I10 Essential (primary) hypertension: Secondary | ICD-10-CM | POA: Diagnosis not present

## 2021-12-05 DIAGNOSIS — S83242A Other tear of medial meniscus, current injury, left knee, initial encounter: Secondary | ICD-10-CM | POA: Diagnosis not present

## 2021-12-05 DIAGNOSIS — Y939 Activity, unspecified: Secondary | ICD-10-CM | POA: Diagnosis not present

## 2021-12-05 LAB — BASIC METABOLIC PANEL
Anion gap: 5 (ref 5–15)
BUN: 13 mg/dL (ref 8–23)
CO2: 27 mmol/L (ref 22–32)
Calcium: 9.5 mg/dL (ref 8.9–10.3)
Chloride: 104 mmol/L (ref 98–111)
Creatinine, Ser: 0.74 mg/dL (ref 0.44–1.00)
GFR, Estimated: >60 mL/min (ref >60)
Glucose, Bld: 118 mg/dL — ABNORMAL HIGH (ref 70–99)
Potassium: 4.2 mmol/L (ref 3.5–5.1)
Sodium: 136 mmol/L (ref 135–145)

## 2021-12-05 NOTE — Progress Notes (Signed)

## 2021-12-07 IMAGING — DX DG KNEE AP/LAT W/ SUNRISE*L*
3 series · 3 of 3 positions shown · non-contrast
Comparison: None.

CLINICAL DATA: left knee pain

EXAM:
LEFT KNEE 3 VIEWS

[knee ap]
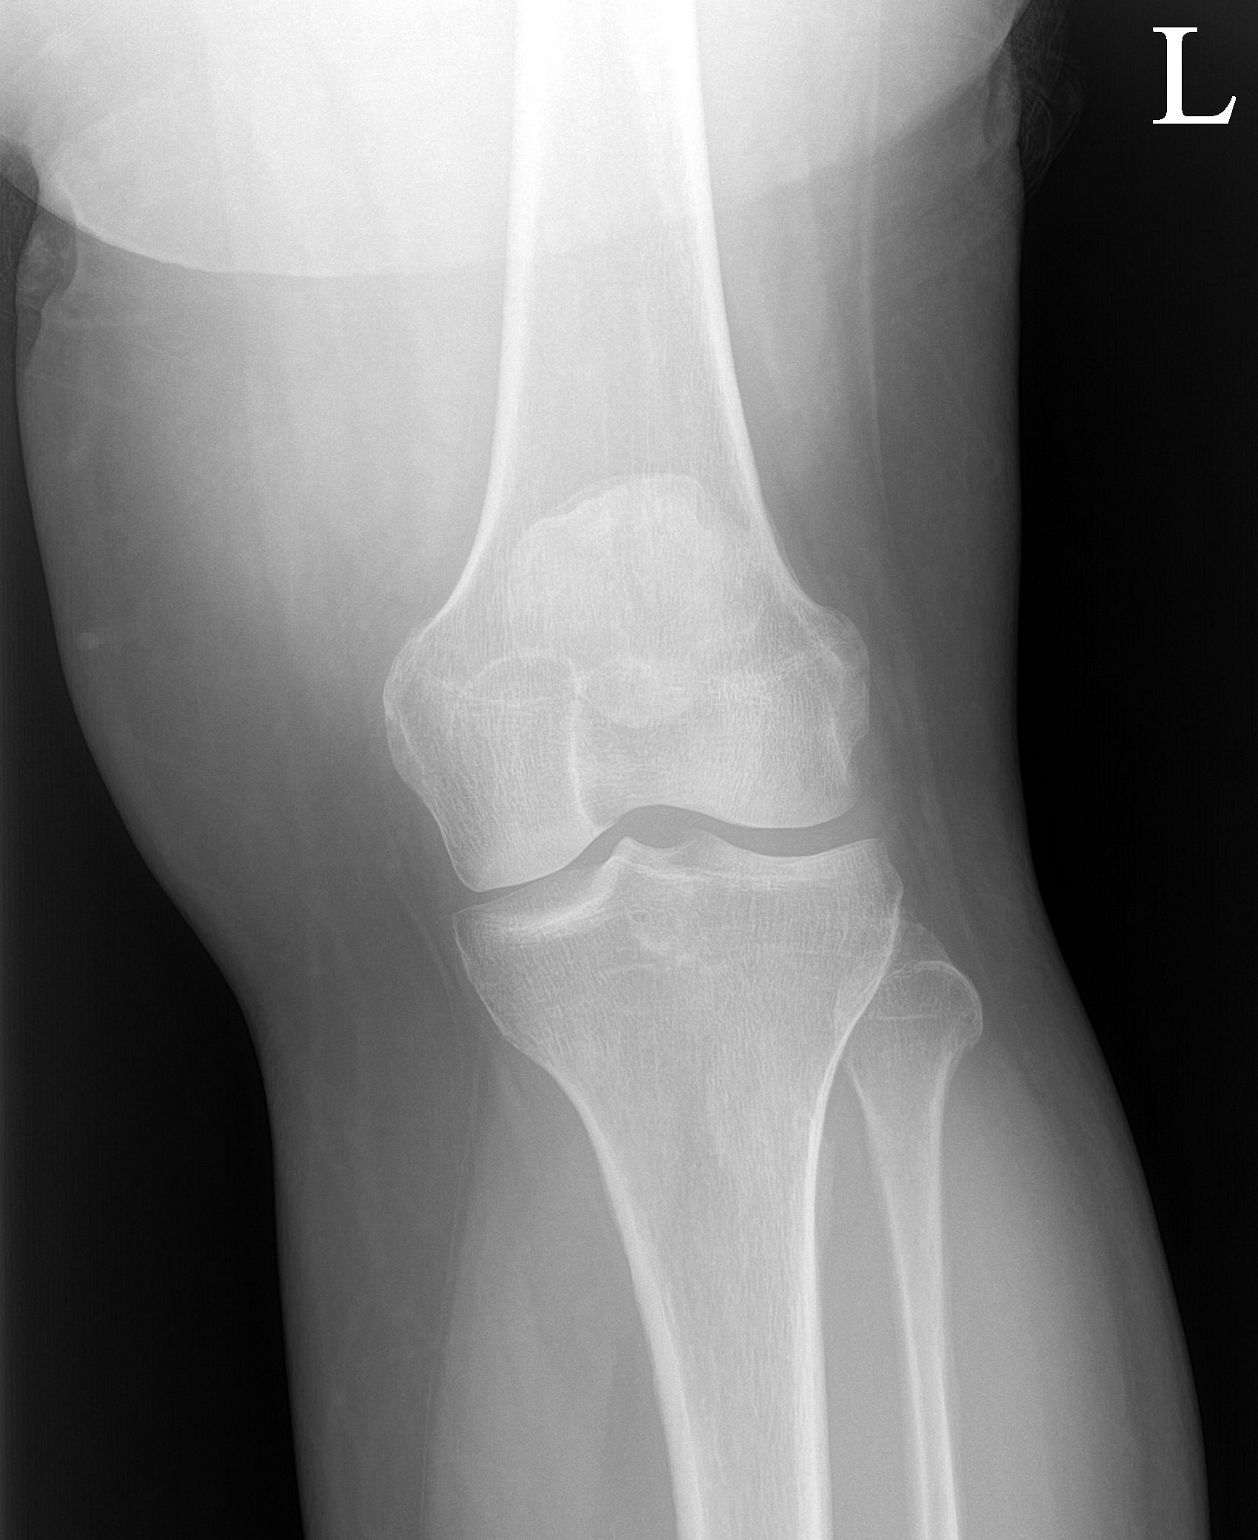

[knee lat]
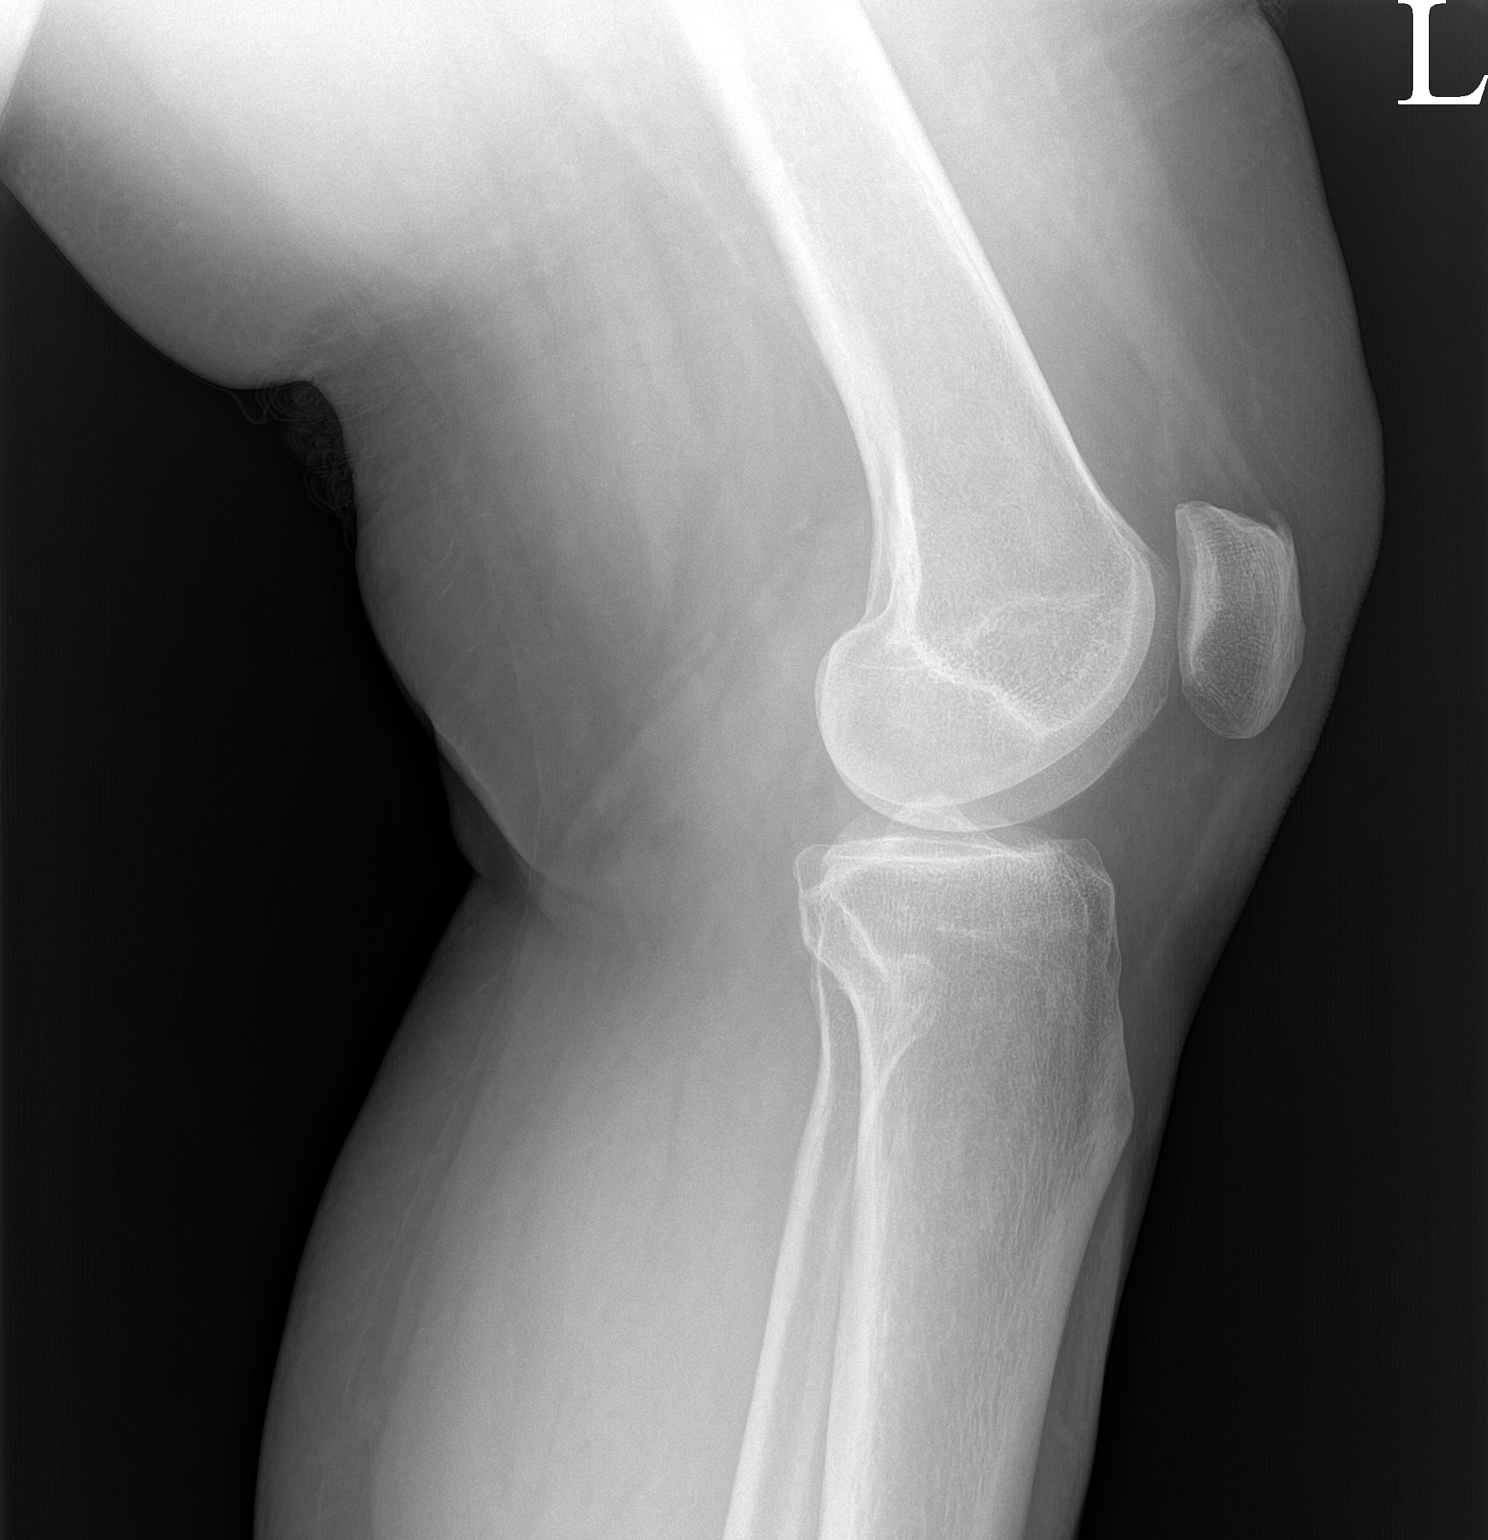

[patella]
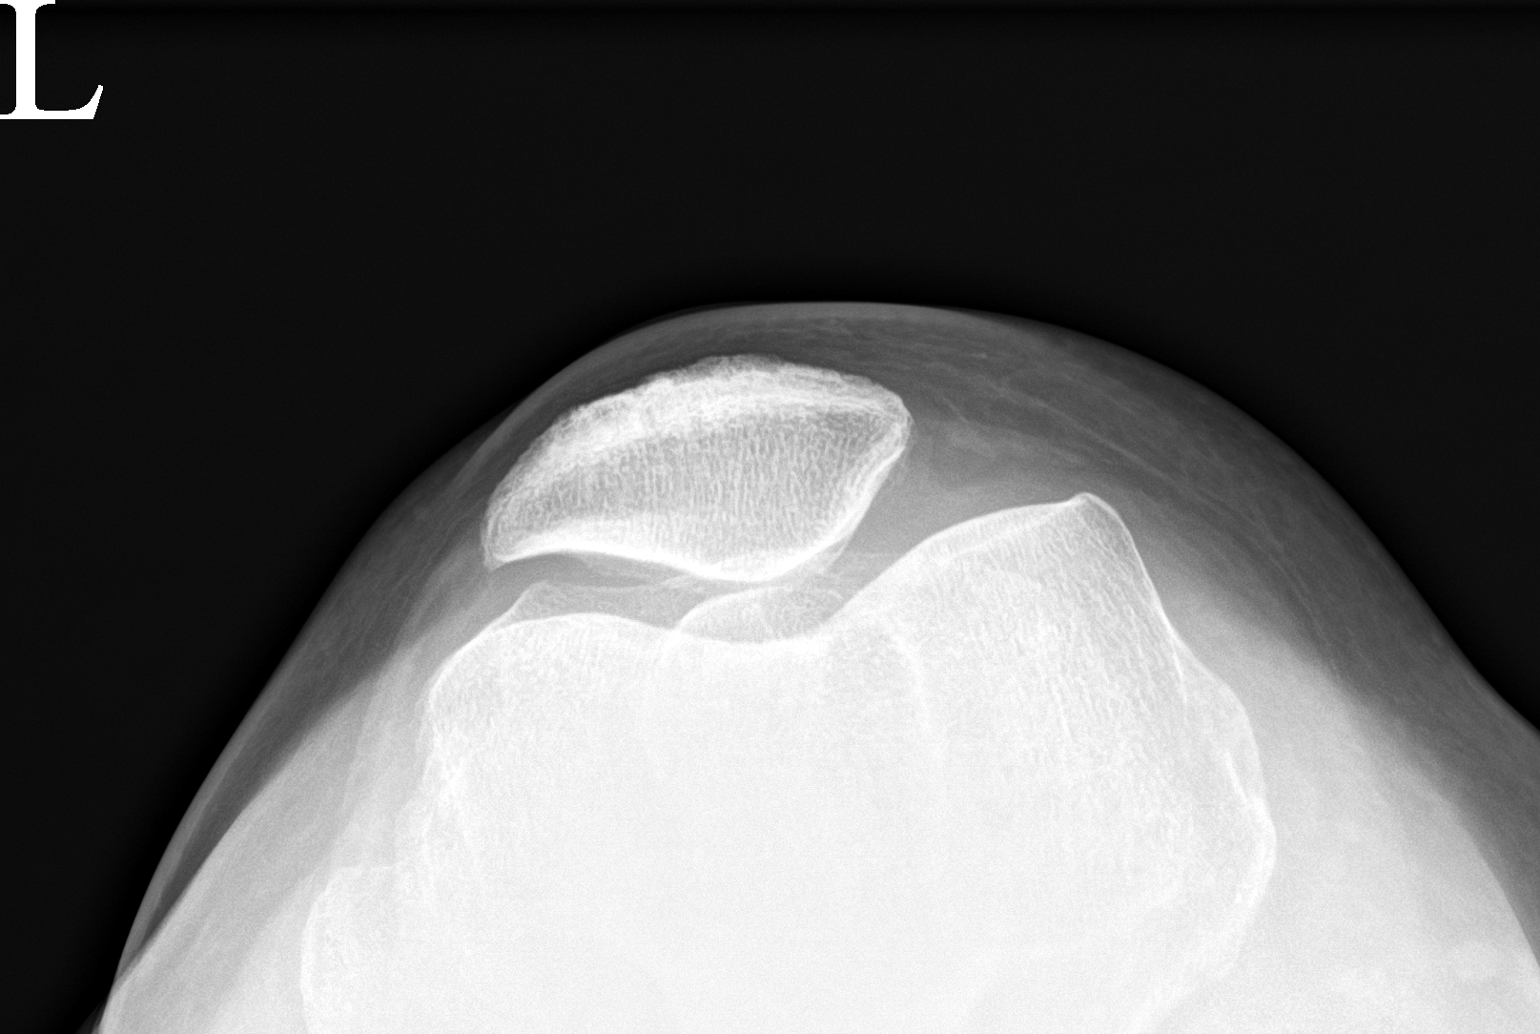

[3 of 3 positions shown; findings below may reference images not displayed]

FINDINGS: No acute fracture or dislocation. Joint spaces and alignment are
maintained. Enthesophyte at the quadriceps tendon insertion on the
patella. No area of erosion or osseous destruction. No unexpected
radiopaque foreign body. Soft tissues are unremarkable.
IMPRESSION: No acute fracture or dislocation.

## 2021-12-09 ENCOUNTER — Other Ambulatory Visit: Payer: Self-pay

## 2021-12-09 ENCOUNTER — Ambulatory Visit (HOSPITAL_BASED_OUTPATIENT_CLINIC_OR_DEPARTMENT_OTHER): Payer: 59 | Admitting: Anesthesiology

## 2021-12-09 ENCOUNTER — Encounter (HOSPITAL_BASED_OUTPATIENT_CLINIC_OR_DEPARTMENT_OTHER): Payer: Self-pay | Admitting: Orthopaedic Surgery

## 2021-12-09 ENCOUNTER — Ambulatory Visit (HOSPITAL_BASED_OUTPATIENT_CLINIC_OR_DEPARTMENT_OTHER)
Admission: RE | Admit: 2021-12-09 | Discharge: 2021-12-09 | Disposition: A | Discharge status: Home / Self Care | Payer: 59 | Attending: Orthopaedic Surgery | Admitting: Orthopaedic Surgery

## 2021-12-09 ENCOUNTER — Encounter (HOSPITAL_BASED_OUTPATIENT_CLINIC_OR_DEPARTMENT_OTHER)
Admission: RE | Disposition: A | Discharge status: Home / Self Care | Payer: Self-pay | Source: Home / Self Care | Attending: Orthopaedic Surgery

## 2021-12-09 DIAGNOSIS — I10 Essential (primary) hypertension: Secondary | ICD-10-CM | POA: Insufficient documentation

## 2021-12-09 DIAGNOSIS — Z6838 Body mass index (BMI) 38.0-38.9, adult: Secondary | ICD-10-CM | POA: Insufficient documentation

## 2021-12-09 DIAGNOSIS — M25862 Other specified joint disorders, left knee: Secondary | ICD-10-CM | POA: Diagnosis not present

## 2021-12-09 DIAGNOSIS — M659 Synovitis and tenosynovitis, unspecified: Secondary | ICD-10-CM | POA: Insufficient documentation

## 2021-12-09 DIAGNOSIS — Y939 Activity, unspecified: Secondary | ICD-10-CM | POA: Insufficient documentation

## 2021-12-09 DIAGNOSIS — I358 Other nonrheumatic aortic valve disorders: Secondary | ICD-10-CM | POA: Insufficient documentation

## 2021-12-09 DIAGNOSIS — S83242A Other tear of medial meniscus, current injury, left knee, initial encounter: Secondary | ICD-10-CM | POA: Insufficient documentation

## 2021-12-09 DIAGNOSIS — S83241A Other tear of medial meniscus, current injury, right knee, initial encounter: Secondary | ICD-10-CM | POA: Diagnosis not present

## 2021-12-09 DIAGNOSIS — W19XXXA Unspecified fall, initial encounter: Secondary | ICD-10-CM | POA: Insufficient documentation

## 2021-12-09 DIAGNOSIS — E669 Obesity, unspecified: Secondary | ICD-10-CM | POA: Insufficient documentation

## 2021-12-09 DIAGNOSIS — Z87891 Personal history of nicotine dependence: Secondary | ICD-10-CM | POA: Insufficient documentation

## 2021-12-09 DIAGNOSIS — M797 Fibromyalgia: Secondary | ICD-10-CM | POA: Insufficient documentation

## 2021-12-09 DIAGNOSIS — G8918 Other acute postprocedural pain: Secondary | ICD-10-CM | POA: Diagnosis not present

## 2021-12-09 DIAGNOSIS — M6752 Plica syndrome, left knee: Secondary | ICD-10-CM

## 2021-12-09 HISTORY — PX: CHONDROPLASTY: SHX5177

## 2021-12-09 SURGERY — CHONDROPLASTY
Anesthesia: Regional | Site: Knee | Laterality: Left

## 2021-12-09 MED ORDER — FENTANYL CITRATE (PF) 100 MCG/2ML IJ SOLN
INTRAMUSCULAR | Status: DC | PRN
  Administered 2021-12-09 (×3): 50 ug via INTRAVENOUS

## 2021-12-09 MED ORDER — ROPIVACAINE HCL 7.5 MG/ML IJ SOLN
INTRAMUSCULAR | Status: DC | PRN
  Administered 2021-12-09: 25 mL via PERINEURAL

## 2021-12-09 MED ORDER — BUPIVACAINE HCL (PF) 0.25 % IJ SOLN
INTRAMUSCULAR | Status: AC
  Filled 2021-12-09: qty 30

## 2021-12-09 MED ORDER — MIDAZOLAM HCL 2 MG/2ML IJ SOLN
INTRAMUSCULAR | Status: AC
  Filled 2021-12-09: qty 2

## 2021-12-09 MED ORDER — SODIUM CHLORIDE 0.9 % IR SOLN
Status: DC | PRN
  Administered 2021-12-09: 6000 mL

## 2021-12-09 MED ORDER — MEPERIDINE HCL 25 MG/ML IJ SOLN
INTRAMUSCULAR | Status: AC
  Filled 2021-12-09: qty 1

## 2021-12-09 MED ORDER — ONDANSETRON HCL 4 MG/2ML IJ SOLN
INTRAMUSCULAR | Status: DC | PRN
  Administered 2021-12-09: 4 mg via INTRAVENOUS

## 2021-12-09 MED ORDER — KETOROLAC TROMETHAMINE 30 MG/ML IJ SOLN
30.0000 mg | Freq: Once | INTRAMUSCULAR | Status: AC
  Administered 2021-12-09: 30 mg via INTRAVENOUS

## 2021-12-09 MED ORDER — ACETAMINOPHEN 500 MG PO TABS
ORAL_TABLET | ORAL | Status: AC
  Filled 2021-12-09: qty 2

## 2021-12-09 MED ORDER — GABAPENTIN 300 MG PO CAPS
300.0000 mg | ORAL_CAPSULE | Freq: Once | ORAL | Status: AC
  Administered 2021-12-09: 300 mg via ORAL

## 2021-12-09 MED ORDER — MEPERIDINE HCL 25 MG/ML IJ SOLN
6.2500 mg | INTRAMUSCULAR | Status: DC | PRN
  Administered 2021-12-09 (×2): 12.5 mg via INTRAVENOUS

## 2021-12-09 MED ORDER — FENTANYL CITRATE (PF) 100 MCG/2ML IJ SOLN
INTRAMUSCULAR | Status: AC
  Filled 2021-12-09: qty 2

## 2021-12-09 MED ORDER — MIDAZOLAM HCL 5 MG/5ML IJ SOLN
INTRAMUSCULAR | Status: DC | PRN
Start: 2021-12-09 — End: 2021-12-09
  Administered 2021-12-09: 2 mg via INTRAVENOUS

## 2021-12-09 MED ORDER — PHENYLEPHRINE 40 MCG/ML (10ML) SYRINGE FOR IV PUSH (FOR BLOOD PRESSURE SUPPORT)
PREFILLED_SYRINGE | INTRAVENOUS | Status: AC
  Filled 2021-12-09: qty 10

## 2021-12-09 MED ORDER — ONDANSETRON HCL 4 MG/2ML IJ SOLN
INTRAMUSCULAR | Status: AC
  Filled 2021-12-09: qty 2

## 2021-12-09 MED ORDER — CLINDAMYCIN PHOSPHATE 900 MG/50ML IV SOLN
900.0000 mg | INTRAVENOUS | Status: AC
  Administered 2021-12-09: 900 mg via INTRAVENOUS

## 2021-12-09 MED ORDER — LIDOCAINE 2% (20 MG/ML) 5 ML SYRINGE
INTRAMUSCULAR | Status: AC
  Filled 2021-12-09: qty 5

## 2021-12-09 MED ORDER — OXYCODONE HCL 5 MG PO TABS
ORAL_TABLET | ORAL | Status: AC
  Filled 2021-12-09: qty 1

## 2021-12-09 MED ORDER — MIDAZOLAM HCL 2 MG/2ML IJ SOLN
2.0000 mg | Freq: Once | INTRAMUSCULAR | Status: AC
  Administered 2021-12-09: 2 mg via INTRAVENOUS

## 2021-12-09 MED ORDER — HYDROMORPHONE HCL 1 MG/ML IJ SOLN
INTRAMUSCULAR | Status: AC
  Filled 2021-12-09: qty 0.5

## 2021-12-09 MED ORDER — ACETAMINOPHEN 500 MG PO TABS
1000.0000 mg | ORAL_TABLET | Freq: Once | ORAL | Status: AC
  Administered 2021-12-09: 1000 mg via ORAL

## 2021-12-09 MED ORDER — GABAPENTIN 300 MG PO CAPS
ORAL_CAPSULE | ORAL | Status: AC
  Filled 2021-12-09: qty 1

## 2021-12-09 MED ORDER — ACETAMINOPHEN 160 MG/5ML PO SOLN: 325.0000 mg | ORAL | Status: DC | PRN

## 2021-12-09 MED ORDER — PROPOFOL 10 MG/ML IV BOLUS
INTRAVENOUS | Status: DC | PRN
  Administered 2021-12-09: 200 mg via INTRAVENOUS

## 2021-12-09 MED ORDER — ONDANSETRON HCL 4 MG/2ML IJ SOLN: 4.0000 mg | Freq: Once | INTRAMUSCULAR | Status: DC | PRN

## 2021-12-09 MED ORDER — OXYCODONE HCL 5 MG/5ML PO SOLN: 5.0000 mg | Freq: Once | ORAL | Status: AC | PRN

## 2021-12-09 MED ORDER — EPINEPHRINE PF 1 MG/ML IJ SOLN
INTRAMUSCULAR | Status: AC
  Filled 2021-12-09: qty 2

## 2021-12-09 MED ORDER — DEXAMETHASONE SODIUM PHOSPHATE 10 MG/ML IJ SOLN
INTRAMUSCULAR | Status: AC
  Filled 2021-12-09: qty 1

## 2021-12-09 MED ORDER — TRANEXAMIC ACID-NACL 1000-0.7 MG/100ML-% IV SOLN
INTRAVENOUS | Status: AC
  Filled 2021-12-09: qty 100

## 2021-12-09 MED ORDER — LACTATED RINGERS IV SOLN: INTRAVENOUS | Status: DC

## 2021-12-09 MED ORDER — ACETAMINOPHEN 325 MG PO TABS: 325.0000 mg | ORAL_TABLET | ORAL | Status: DC | PRN

## 2021-12-09 MED ORDER — FENTANYL CITRATE (PF) 100 MCG/2ML IJ SOLN
25.0000 ug | INTRAMUSCULAR | Status: DC | PRN
  Administered 2021-12-09: 50 ug via INTRAVENOUS

## 2021-12-09 MED ORDER — CLINDAMYCIN PHOSPHATE 900 MG/50ML IV SOLN
INTRAVENOUS | Status: AC
  Filled 2021-12-09: qty 50

## 2021-12-09 MED ORDER — TRANEXAMIC ACID-NACL 1000-0.7 MG/100ML-% IV SOLN
1000.0000 mg | INTRAVENOUS | Status: AC
  Administered 2021-12-09: 1000 mg via INTRAVENOUS

## 2021-12-09 MED ORDER — OXYCODONE HCL 5 MG PO TABS
5.0000 mg | ORAL_TABLET | Freq: Once | ORAL | Status: AC | PRN
  Administered 2021-12-09: 5 mg via ORAL

## 2021-12-09 MED ORDER — HYDROMORPHONE HCL 1 MG/ML IJ SOLN
0.5000 mg | INTRAMUSCULAR | Status: DC | PRN
  Administered 2021-12-09: 0.25 mg via INTRAVENOUS

## 2021-12-09 MED ORDER — ATROPINE SULFATE 0.4 MG/ML IV SOLN
INTRAVENOUS | Status: AC
  Filled 2021-12-09: qty 1

## 2021-12-09 MED ORDER — EPHEDRINE 5 MG/ML INJ
INTRAVENOUS | Status: AC
  Filled 2021-12-09: qty 5

## 2021-12-09 MED ORDER — FENTANYL CITRATE (PF) 100 MCG/2ML IJ SOLN
100.0000 ug | Freq: Once | INTRAMUSCULAR | Status: AC
  Administered 2021-12-09: 100 ug via INTRAVENOUS

## 2021-12-09 MED ORDER — LIDOCAINE HCL (CARDIAC) PF 100 MG/5ML IV SOSY
PREFILLED_SYRINGE | INTRAVENOUS | Status: DC | PRN
  Administered 2021-12-09: 30 mg via INTRAVENOUS

## 2021-12-09 MED ORDER — SUCCINYLCHOLINE CHLORIDE 200 MG/10ML IV SOSY
PREFILLED_SYRINGE | INTRAVENOUS | Status: AC
  Filled 2021-12-09: qty 10

## 2021-12-09 MED ORDER — DEXAMETHASONE SODIUM PHOSPHATE 10 MG/ML IJ SOLN
INTRAMUSCULAR | Status: DC | PRN
  Administered 2021-12-09: 5 mg via INTRAVENOUS

## 2021-12-09 MED ORDER — KETOROLAC TROMETHAMINE 30 MG/ML IJ SOLN
INTRAMUSCULAR | Status: AC
  Filled 2021-12-09: qty 1

## 2021-12-09 SURGICAL SUPPLY — 46 items
APL PRP STRL LF DISP 70% ISPRP (MISCELLANEOUS) ×1
BLADE SURG 15 STRL LF DISP TIS (BLADE) IMPLANT
BLADE SURG 15 STRL SS (BLADE) ×2
BNDG ELASTIC 4X5.8 VLCR STR LF (GAUZE/BANDAGES/DRESSINGS) ×2 IMPLANT
BNDG ELASTIC 6X5.8 VLCR STR LF (GAUZE/BANDAGES/DRESSINGS) ×2 IMPLANT
CHLORAPREP W/TINT 26 (MISCELLANEOUS) ×2 IMPLANT
DISSECTOR  3.8MM X 13CM (MISCELLANEOUS) ×1
DISSECTOR 3.8MM X 13CM (MISCELLANEOUS) ×1 IMPLANT
DRAPE ARTHROSCOPY W/POUCH 90 (DRAPES) ×2 IMPLANT
DRAPE IMP U-DRAPE 54X76 (DRAPES) ×2 IMPLANT
DRAPE U-SHAPE 47X51 STRL (DRAPES) ×2 IMPLANT
DRSG PAD ABDOMINAL 8X10 ST (GAUZE/BANDAGES/DRESSINGS) ×2 IMPLANT
DW OUTFLOW CASSETTE/TUBE SET (MISCELLANEOUS) ×1 IMPLANT
ELECT REM PT RETURN 9FT ADLT (ELECTROSURGICAL) ×2
ELECTRODE REM PT RTRN 9FT ADLT (ELECTROSURGICAL) IMPLANT
GAUZE SPONGE 4X4 12PLY STRL (GAUZE/BANDAGES/DRESSINGS) ×2 IMPLANT
GAUZE XEROFORM 1X8 LF (GAUZE/BANDAGES/DRESSINGS) ×1 IMPLANT
GLOVE SRG 8 PF TXTR STRL LF DI (GLOVE) ×1 IMPLANT
GLOVE SURG ENC MOIS LTX SZ6 (GLOVE) ×2 IMPLANT
GLOVE SURG ENC MOIS LTX SZ7.5 (GLOVE) ×2 IMPLANT
GLOVE SURG UNDER POLY LF SZ6.5 (GLOVE) ×2 IMPLANT
GLOVE SURG UNDER POLY LF SZ8 (GLOVE) ×2
GOWN STRL REUS W/ TWL LRG LVL3 (GOWN DISPOSABLE) ×1 IMPLANT
GOWN STRL REUS W/ TWL XL LVL3 (GOWN DISPOSABLE) ×1 IMPLANT
GOWN STRL REUS W/TWL LRG LVL3 (GOWN DISPOSABLE) ×2
GOWN STRL REUS W/TWL XL LVL3 (GOWN DISPOSABLE) ×2
KIT ROOT REPAIR MEINISCAL PEEK (Anchor) IMPLANT
MANIFOLD NEPTUNE II (INSTRUMENTS) ×2 IMPLANT
MEINISCAL ROOT REPAIR KIT PEEK (Anchor) ×2 IMPLANT
NDL HYPO 18GX1.5 BLUNT FILL (NEEDLE) ×1 IMPLANT
NDL SAFETY ECLIPSE 18X1.5 (NEEDLE) ×1 IMPLANT
NEEDLE HYPO 18GX1.5 BLUNT FILL (NEEDLE) ×2 IMPLANT
NEEDLE HYPO 18GX1.5 SHARP (NEEDLE) ×2
PACK ARTHROSCOPY DSU (CUSTOM PROCEDURE TRAY) ×2 IMPLANT
PACK BASIN DAY SURGERY FS (CUSTOM PROCEDURE TRAY) ×2 IMPLANT
PADDING CAST COTTON 6X4 STRL (CAST SUPPLIES) ×2 IMPLANT
PENCIL SMOKE EVACUATOR (MISCELLANEOUS) ×1 IMPLANT
PORT APPOLLO RF 90DEGREE MULTI (SURGICAL WAND) ×1 IMPLANT
SLEEVE SCD COMPRESS KNEE MED (STOCKING) ×2 IMPLANT
SUT ETHILON 3 0 PS 1 (SUTURE) ×2 IMPLANT
SUT VIC AB 2-0 SH 27 (SUTURE) ×2
SUT VIC AB 2-0 SH 27XBRD (SUTURE) IMPLANT
SYR 5ML LL (SYRINGE) ×2 IMPLANT
TOWEL GREEN STERILE FF (TOWEL DISPOSABLE) ×2 IMPLANT
TUBING ARTHROSCOPY IRRIG 16FT (MISCELLANEOUS) ×2 IMPLANT
WRAP KNEE MAXI GEL POST OP (GAUZE/BANDAGES/DRESSINGS) ×2 IMPLANT

## 2021-12-09 NOTE — Interval H&P Note (Signed)
History and Physical Interval Note:  12/09/2021 9:41 AM  Patricia Mathews  has presented today for surgery, with the diagnosis of Left medial meniscal root tear, chrondral loss.  The various methods of treatment have been discussed with the patient and family. After consideration of risks, benefits and other options for treatment, the patient has consented to  Procedure(s): LEFT KNEE MEDIAL ROOT REPAIR, FEMORAL CHONDROPLASTY (Left) as a surgical intervention.  The patient's history has been reviewed, patient examined, no change in status, stable for surgery.  I have reviewed the patient's chart and labs.  Questions were answered to the patient's satisfaction.     Vanetta Mulders

## 2021-12-09 NOTE — Op Note (Signed)
Date of Surgery: 12/09/2021  INDICATIONS: Ms. Patricia Mathews is a 61 y.o.-year-old female with left symptomatic meniscal root tear.  The risk and benefits of the procedure with discussed in detail and documented in the pre-operative evaluation.  PREOPERATIVE DIAGNOSIS: 1.  Left symptomatic medial meniscal root tear with chondral lesion about the medial femoral condyle  POSTOPERATIVE DIAGNOSIS: Same.  PROCEDURE: 1.  Left medial meniscal root repair 2.  Left knee synovectomy  SURGEON: Yevonne Pax MD  ASSISTANT: Raynelle Fanning, ATC; necessary for the timely completion of procedure and due to complexity of procedure.  ANESTHESIA:  general plus abductor canal block IV FLUIDS AND URINE: See anesthesia record.  ANTIBIOTICS: Ancef 2 g  ESTIMATED BLOOD LOSS: 10 mL.  IMPLANTS:  Implant Name Type Inv. Item Serial No. Manufacturer Lot No. LRB No. Used Action  MEINISCAL ROOT REPAIR KIT PEEK - [id number] Anchor MEINISCAL ROOT REPAIR KIT PEEK  [employer] [id number] Left 1 Implanted    DRAINS: None  CULTURES: None  COMPLICATIONS: none  DESCRIPTION OF PROCEDURE:  Examination under anesthesia: A careful examination under anesthesia was performed.  Knee ROM motion was: 0-1 30 Lachman: Normal Pivot Shift: Normal Posterior drawer: normal.   Varus stability in full extension: normal.   Varus stability in 30 degrees of flexion: normal.  Valgus stability in full extension: normal.   Valgus stability in 30 degrees of flexion: normal.  Posterolateral drawer: normal   Intra-operative findings: A thorough arthroscopic examination of the knee was performed.  The findings are: 1. Suprapatellar pouch: Normal 2. Undersurface of median ridge: Normal 3. Medial patellar facet: Normal 4. Lateral patellar facet: Normal 5. Trochlea: Normal 6. Lateral gutter/popliteus tendon: Normal 7. Hoffa's fat pad: Normal 8. Medial gutter/plica: Normal 9. ACL: Normal 10. PCL: Normal 11. Medial meniscus: Complete  posterior meniscal root tear 12. Medial compartment cartilage: Grade 3 lesion involving cartilage of the medial femoral condyle 13. Lateral meniscus: Normal 14. Lateral compartment cartilage: Normal   The patient was identified in the preoperative holding.  Correct site was marked according universal protocol with nursing.  Anesthesia subsequently performed an abductor canal block.  She was subsequently taken back to the operating room.  Anesthesia was induced.  She is transferred over the operating table.  She is prepped and draped in the usual sterile fashion.  Ancef was given 1 hour prior to skin incision.  Again final timeout was performed.  Began with diagnostic knee arthroscopy.  Standard anterior lateral portal was established.  Arthroscopy ensued with the above findings.  A medial portal was established under direct visualization.  The medial posterior meniscal root was found to be unstable and pulled forward with a probe.  As result to fiber link #2 sutures were placed through the posterior horn of the meniscus.  ACL guide was then used to reapproximate the native location of the posterior medial meniscus.  And flip cutter was then drilled into place.  A 5 mm socket was created.  The fiber stick was then used to shuttle our posterior meniscal sutures through the tunnel.  These were then appropriately tensioned and placed into a 4.75 mm swivel lock.  This showed excellent approximation into the tunnel.  A shaver was then used in order to perform a chondroplasty back to healthy margin of cartilage involving the medial femoral condyle.  There was a symptomatic plica with extensive synovitis medially and laterally as well as a hypertrophied Hoffa's fat pad these were removed with a combination of shaver and electrocautery.  All  counts are correct at the end of the case.  The wound was closed in layers of 2-0 Vicryl and 3-0 nylon.  Soft dressing was placed.  Hinged knee brace was placed locked in  extension.  She is taken to the PACU without outpatient   Raynelle Fanning ATC was necessary for opening, closing, retracting, limb positioning and overall facilitation and timely completion of the procedure.     POSTOPERATIVE PLAN: She will be nonweightbearing on the left lower extremity for a total of 2 weeks.  At that point we will plan to gradually progress her motion and improve her range of motion as tolerated.  She will be seen by physical therapy.  We will perform suture removal in 2 weeks  Yevonne Pax, MD 11:52 AM

## 2021-12-09 NOTE — Anesthesia Procedure Notes (Signed)
Procedure Name: LMA Insertion Date/Time: 12/09/2021 10:11 AM Performed by: Willa Frater, CRNA Pre-anesthesia Checklist: Patient identified, Emergency Drugs available, Suction available and Patient being monitored Patient Re-evaluated:Patient Re-evaluated prior to induction Oxygen Delivery Method: Circle system utilized Preoxygenation: Pre-oxygenation with 100% oxygen Induction Type: IV induction Ventilation: Mask ventilation without difficulty LMA: LMA inserted LMA Size: 4.0 Number of attempts: 1 Airway Equipment and Method: Bite block Placement Confirmation: positive ETCO2 Tube secured with: Tape Dental Injury: Teeth and Oropharynx as per pre-operative assessment

## 2021-12-09 NOTE — Progress Notes (Signed)
Assisted Dr. Oddono with left, ultrasound guided, adductor canal block. Side rails up, monitors on throughout procedure. See vital signs in flow sheet. Tolerated Procedure well.  

## 2021-12-09 NOTE — Discharge Instructions (Addendum)
Discharge Instructions    Attending Surgeon: Vanetta Mulders, MD Office Phone Number: [phone]   Diagnosis and Procedures:    Surgeries Performed: Left knee medial meniscal root repair  Discharge Plan:    Diet: Resume usual diet. Begin with light or bland foods.  Drink plenty of fluids.  Activity:  Non-weightbearing, for 2 weeks, total utilizing crutches, until seen at postoperative Physical Therapy visit this week. Please keep your brace locked until follow-up. You are advised to go home directly from the hospital or surgical center. Restrict your activities.  GENERAL INSTRUCTIONS: 1.  Keep your surgical site elevated above your heart for at least 5-7 days or longer to prevent swelling. This will improve your comfort and your overall recovery following surgery.     2. Please call Dr. Eddie Dibbles office at [phone] with questions Monday-Friday during business hours. If no one answers, please leave a message and someone should get back to the patient within 24 hours. For emergencies please call 911 or proceed to the emergency room.   3. Patient to notify surgical team if experiences any of the following: Bowel/Bladder dysfunction, uncontrolled pain, nerve/muscle weakness, incision with increased drainage or redness, nausea/vomiting and Fever greater than 101.0 F.  Be alert for signs of infection including redness, streaking, odor, fever or chills. Be alert for excessive pain or bleeding and notify your surgeon immediately.  WOUND INSTRUCTIONS:   Leave your dressing/cast/splint in place until your post operative visit.  Keep it clean and dry.  Always keep the incision clean and dry until the staples/sutures are removed. If there is no drainage from the incision you should keep it open to air. If there is drainage from the incision you must keep it covered at all times until the drainage stops  Do not soak in a bath tub, hot tub, pool, lake or other body of water until 21  days after your surgery and your incision is completely dry and healed.  If you have removable sutures (or staples) they must be removed 10-14 days (unless otherwise instructed) from the day of your surgery.     1)  Elevate the extremity as much as possible.  2)  Keep the dressing clean and dry.  3)  Please call us if the dressing becomes wet or dirty.  4)  If you are experiencing worsening pain or worsening swelling, please call.     MEDICATIONS: Resume all previous home medications at the previous prescribed dose and frequency unless otherwise noted Start taking the  pain medications on an as-needed basis as prescribed  Please taper down pain medication over the next week following surgery.  Ideally you should not require a refill of any narcotic pain medication.  Take pain medication with food to minimize nausea. In addition to the prescribed pain medication, you may take over-the-counter pain relievers such as Tylenol.  Do NOT take additional tylenol if your pain medication already has tylenol in it.  Aspirin 325mg  daily for four weeks.      FOLLOWUP INSTRUCTIONS: 1. Follow up at the Physical Therapy Clinic 3-4 days following surgery. This appointment should be scheduled unless other arrangements have been made.The Physical Therapy scheduling number is [phone] if an appointment has not already been arranged.  2. Contact Dr. Eddie Dibbles office during office hours at [phone] or the practice after hours line at [phone] for non-emergencies. For medical emergencies call 911.   Discharge Location: Home     Post Anesthesia Home Care Instructions  Activity: Get plenty of rest for the remainder of the day. A responsible individual must stay with you for 24 hours following the procedure.  For the next 24 hours, DO NOT: -Drive a car -Paediatric nurse -Drink alcoholic beverages -Take any medication unless instructed by your physician -Make any legal decisions or sign  important papers.  Meals: Start with liquid foods such as gelatin or soup. Progress to regular foods as tolerated. Avoid greasy, spicy, heavy foods. If nausea and/or vomiting occur, drink only clear liquids until the nausea and/or vomiting subsides. Call your physician if vomiting continues.  Special Instructions/Symptoms: Your throat may feel dry or sore from the anesthesia or the breathing tube placed in your throat during surgery. If this causes discomfort, gargle with warm salt water. The discomfort should disappear within 24 hours.  Next dose of Tylenol can be taken at 2pm today   Regional Anesthesia Blocks  1. Numbness or the inability to move the "blocked" extremity may last from 3-48 hours after placement. The length of time depends on the medication injected and your individual response to the medication. If the numbness is not going away after 48 hours, call your surgeon.  2. The extremity that is blocked will need to be protected until the numbness is gone and the  Strength has returned. Because you cannot feel it, you will need to take extra care to avoid injury. Because it may be weak, you may have difficulty moving it or using it. You may not know what position it is in without looking at it while the block is in effect.  3. For blocks in the legs and feet, returning to weight bearing and walking needs to be done carefully. You will need to wait until the numbness is entirely gone and the strength has returned. You should be able to move your leg and foot normally before you try and bear weight or walk. You will need someone to be with you when you first try to ensure you do not fall and possibly risk injury.  4. Bruising and tenderness at the needle site are common side effects and will resolve in a few days.  5. Persistent numbness or new problems with movement should be communicated to the surgeon or the [facility] [phone] [place]  [phone]).

## 2021-12-09 NOTE — Anesthesia Preprocedure Evaluation (Signed)
Anesthesia Evaluation  Patient identified by MRN, date of birth, ID band Patient awake    Reviewed: Allergy & Precautions, NPO status , Patient's Chart, lab work & pertinent test results  Airway Mallampati: II  TM Distance: >3 FB Neck ROM: Full    Dental no notable dental hx. (+) Teeth Intact, Dental Advisory Given, Caps   Pulmonary sleep apnea and Continuous Positive Airway Pressure Ventilation , former smoker,    Pulmonary exam normal breath sounds clear to auscultation       Cardiovascular hypertension, Pt. on medications Normal cardiovascular exam Rhythm:Regular Rate:Normal  ECG: rate 74  ECHO: Left ventricular ejection fraction, by estimation, is 60 to 65%. The left ventricle has normal function. The left ventricle has no regional wall motion abnormalities. Left ventricular diastolic parameters are consistent with Grade I diastolic dysfunction (impaired relaxation). Right ventricular systolic function is normal. The right ventricular size is normal. The mitral valve is normal in structure. Trivial mitral valve regurgitation. No evidence of mitral stenosis. The aortic valve is tricuspid. Aortic valve regurgitation is not visualized. Mild aortic valve sclerosis is present, with no evidence of aortic valve stenosis. The inferior vena cava is normal in size with greater than 50% respiratory variability, suggesting right atrial pressure of 3 mmHg.   Neuro/Psych  Headaches, PSYCHIATRIC DISORDERS Anxiety    GI/Hepatic negative GI ROS, Neg liver ROS,   Endo/Other  negative endocrine ROS  Renal/GU negative Renal ROS     Musculoskeletal  (+) Fibromyalgia -  Abdominal (+) + obese,   Peds  Hematology HLD   Anesthesia Other Findings lung mass  Reproductive/Obstetrics                            Anesthesia Physical  Anesthesia Plan  ASA: 3  Anesthesia Plan: General and Regional   Post-op Pain  Management: Ofirmev IV (intra-op) and Toradol IV (intra-op)   Induction: Intravenous  PONV Risk Score and Plan: 3 and Ondansetron, Dexamethasone, Midazolam and Treatment may vary due to age or medical condition  Airway Management Planned: LMA  Additional Equipment:   Intra-op Plan:   Post-operative Plan: Extubation in OR  Informed Consent: I have reviewed the patients History and Physical, chart, labs and discussed the procedure including the risks, benefits and alternatives for the proposed anesthesia with the patient or authorized representative who has indicated his/her understanding and acceptance.     Dental advisory given  Plan Discussed with: CRNA and Anesthesiologist  Anesthesia Plan Comments: (  PAT note by Karoline Caldwell, PA-C:  Recently evaluated by cardiologist Dr. Radford Pax for new right bundle branch block on EKG and palpitations.  Echo was ordered which showed normal LVEF, grade 1 diastolic dysfunction, no significant valvular abnormalities.  Coronary CT showed calcium score of 0.  Event monitor has been ordered but not yet completed.  OSA on CPAP.  History of Sjogren's and lupus followed by rheumatologist Dr. Trudie Reed.  Preop labs reviewed, unremarkable.  EKG 09/09/2021: Normal sinus rhythm.  Rate 74.  LAD.  RBBB. Minimal voltage criteria for LVH, may be normal variant ( R in aVL ).  No significant change.       Cardiac CT 08/29/2021: IMPRESSION: 1. Coronary calcium score of 0. This was 1st percentile for age, gender, and race matched controls.  TTE 08/29/2021: 1. Left ventricular ejection fraction, by estimation, is 60 to 65%. The  left ventricle has normal function. The left ventricle has no regional  wall motion abnormalities.  Left ventricular diastolic parameters are  consistent with Grade I diastolic  dysfunction (impaired relaxation).  2. Right ventricular systolic function is normal. The right ventricular  size is normal.  3. The mitral valve is  normal in structure. Trivial mitral valve  regurgitation. No evidence of mitral stenosis.  4. The aortic valve is tricuspid. Aortic valve regurgitation is not  visualized. Mild aortic valve sclerosis is present, with no evidence of  aortic valve stenosis.  5. The inferior vena cava is normal in size with greater than 50%  respiratory variability, suggesting right atrial pressure of 3 mmHg.    )        Anesthesia Quick Evaluation

## 2021-12-09 NOTE — Anesthesia Postprocedure Evaluation (Signed)
Anesthesia Post Note  Patient: Patricia Mathews  Procedure(s) Performed: LEFT KNEE MEDIAL ROOT REPAIR, FEMORAL CHONDROPLASTY (Left: Knee)     Patient location during evaluation: PACU Anesthesia Type: Regional and General Level of consciousness: awake and alert Pain management: pain level controlled Vital Signs Assessment: post-procedure vital signs reviewed and stable Respiratory status: spontaneous breathing, nonlabored ventilation, respiratory function stable and patient connected to nasal cannula oxygen Cardiovascular status: blood pressure returned to baseline and stable Postop Assessment: no apparent nausea or vomiting Anesthetic complications: no   No notable events documented.  Last Vitals:  Vitals:   12/09/21 1315 12/09/21 1334  BP: 130/83 (!) 154/84  Pulse: 81 96  Resp: 14 17  Temp:  37.1 C  SpO2: 92% 99%    Last Pain:  Vitals:   12/09/21 1334  TempSrc:   PainSc: 4                  [name]

## 2021-12-09 NOTE — Transfer of Care (Signed)
Immediate Anesthesia Transfer of Care Note  Patient: Patricia Mathews  Procedure(s) Performed: LEFT KNEE MEDIAL ROOT REPAIR, FEMORAL CHONDROPLASTY (Left: Knee)  Patient Location: PACU  Anesthesia Type:GA combined with regional for post-op pain  Level of Consciousness: awake, alert , oriented, drowsy and patient cooperative  Airway & Oxygen Therapy: Patient Spontanous Breathing and Patient connected to face mask oxygen  Post-op Assessment: Report given to RN and Post -op Vital signs reviewed and stable  Post vital signs: Reviewed and stable  Last Vitals:  Vitals Value Taken Time  BP 133/83 12/09/21 1202  Temp    Pulse 87 12/09/21 1206  Resp 12 12/09/21 1206  SpO2 95 % 12/09/21 1206  Vitals shown include unvalidated device data.  Last Pain:  Vitals:   12/09/21 0732  TempSrc: Oral  PainSc: 10-Worst pain ever      Patients Stated Pain Goal: 6 (09/10/10 1735)  Complications: No notable events documented.

## 2021-12-09 NOTE — Anesthesia Procedure Notes (Signed)
Anesthesia Regional Block: Adductor canal block   Pre-Anesthetic Checklist: , timeout performed,  Correct Patient, Correct Site, Correct Laterality,  Correct Procedure, Correct Position, site marked,  Risks and benefits discussed,  Surgical consent,  Pre-op evaluation,  At surgeon's request and post-op pain management  Laterality: Left  Prep: chloraprep       Needles:  Injection technique: Single-shot  Needle Type: Echogenic Stimulator Needle     Needle Length: 5cm  Needle Gauge: 22     Additional Needles:   Procedures:, nerve stimulator,,, ultrasound used (permanent image in chart),,    Narrative:  Start time: 12/09/2021 8:40 AM End time: 12/09/2021 8:54 AM Injection made incrementally with aspirations every 5 mL.  Performed by: Personally  Anesthesiologist: Janeece Riggers, MD  Additional Notes: Functioning IV was confirmed and monitors were applied.  A 68mm 22ga Arrow echogenic stimulator needle was used. Sterile prep and drape,hand hygiene and sterile gloves were used. Ultrasound guidance: relevant anatomy identified, needle position confirmed, local anesthetic spread visualized around nerve(s)., vascular puncture avoided.  Image printed for medical record. Negative aspiration and negative test dose prior to incremental administration of local anesthetic. The patient tolerated the procedure well.

## 2021-12-09 NOTE — Brief Op Note (Signed)
° °  Brief Op Note  Date of Surgery: 12/09/2021  Preoperative Diagnosis: Left medial meniscal root tear, chrondral loss  Postoperative Diagnosis: same  Procedure: Procedure(s): LEFT KNEE MEDIAL ROOT REPAIR, FEMORAL CHONDROPLASTY  Implants: Implant Name Type Inv. Item Serial No. Manufacturer Lot No. LRB No. Used Action  MEINISCAL ROOT REPAIR KIT PEEK - [id number] Anchor MEINISCAL ROOT REPAIR KIT PEEK  [employer] [id number] Left 1 Implanted    Surgeons: Surgeon(s): Vanetta Mulders, MD  Anesthesia: General    Estimated Blood Loss: See anesthesia record  Complications: None  Condition to PACU: Stable  Yevonne Pax, MD 12/09/2021 11:52 AM

## 2021-12-10 ENCOUNTER — Encounter (HOSPITAL_BASED_OUTPATIENT_CLINIC_OR_DEPARTMENT_OTHER): Payer: Self-pay | Admitting: Orthopaedic Surgery

## 2021-12-12 ENCOUNTER — Ambulatory Visit (HOSPITAL_BASED_OUTPATIENT_CLINIC_OR_DEPARTMENT_OTHER): Payer: 59 | Attending: Orthopaedic Surgery | Admitting: Physical Therapy

## 2021-12-12 ENCOUNTER — Other Ambulatory Visit: Payer: Self-pay

## 2021-12-12 ENCOUNTER — Encounter (HOSPITAL_BASED_OUTPATIENT_CLINIC_OR_DEPARTMENT_OTHER): Payer: Self-pay | Admitting: Physical Therapy

## 2021-12-12 DIAGNOSIS — M25562 Pain in left knee: Secondary | ICD-10-CM | POA: Diagnosis not present

## 2021-12-12 DIAGNOSIS — M25662 Stiffness of left knee, not elsewhere classified: Secondary | ICD-10-CM

## 2021-12-12 DIAGNOSIS — G8929 Other chronic pain: Secondary | ICD-10-CM | POA: Insufficient documentation

## 2021-12-12 DIAGNOSIS — R262 Difficulty in walking, not elsewhere classified: Secondary | ICD-10-CM

## 2021-12-12 DIAGNOSIS — M6281 Muscle weakness (generalized): Secondary | ICD-10-CM
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
## 2021-12-19 ENCOUNTER — Encounter (HOSPITAL_BASED_OUTPATIENT_CLINIC_OR_DEPARTMENT_OTHER): Payer: Self-pay | Admitting: Physical Therapy

## 2021-12-19 ENCOUNTER — Ambulatory Visit (HOSPITAL_BASED_OUTPATIENT_CLINIC_OR_DEPARTMENT_OTHER): Payer: 59 | Admitting: Physical Therapy

## 2021-12-19 ENCOUNTER — Other Ambulatory Visit: Payer: Self-pay

## 2021-12-19 DIAGNOSIS — M25562 Pain in left knee: Secondary | ICD-10-CM

## 2021-12-19 DIAGNOSIS — G8929 Other chronic pain: Secondary | ICD-10-CM | POA: Diagnosis not present

## 2021-12-19 DIAGNOSIS — R262 Difficulty in walking, not elsewhere classified: Secondary | ICD-10-CM

## 2021-12-19 DIAGNOSIS — M6281 Muscle weakness (generalized): Secondary | ICD-10-CM

## 2021-12-19 DIAGNOSIS — M25662 Stiffness of left knee, not elsewhere classified: Secondary | ICD-10-CM

## 2021-12-19 NOTE — Therapy (Signed)
OUTPATIENT PHYSICAL THERAPY TREATMENT NOTE   Patient Name: Patricia Mathews MRN: [id number] DOB:08/24/1961, 61 y.o., female Today's Date: 12/19/2021  PCP: Horald Pollen, MD REFERRING PROVIDER: [place], [place] - 12/19/21 1018     Visit Number 2    Number of Visits 17    Date for PT Re-Evaluation 02/06/22    Authorization Type [facility]    PT Start Time 9201    PT Stop Time 1115    PT Time Calculation (min) 60 min    Activity Tolerance Patient tolerated treatment well    Behavior During Therapy [facility] for tasks assessed/performed             Past Medical History:  Diagnosis Date   Dyspnea    Dysrhythmia    Endometrial polyp    Family history of adverse reaction to anesthesia    Patient states her mother had an adverse reaction to anesthesia but she is not sure exactly what it was   Fibromyalgia    GAD (generalized anxiety disorder)    History of panic attacks    Hypertension    followed by pcp   Medical history non-contributory    Neuropathy    OSA on CPAP    followed by dr dohmeier--- per study 02-02-2018 moderate to severe osa   Right bundle branch block    Sjogren's disease ([place])    Systemic lupus ([place])    rheumonotologist-- dr Page Spiro   Past Surgical History:  Procedure Laterality Date   [place];  1989   CHONDROPLASTY Left 12/09/2021   Procedure: LEFT KNEE MEDIAL ROOT REPAIR, FEMORAL CHONDROPLASTY;  Surgeon: Vanetta Mulders, MD;  Location: [place];  Service: Orthopedics;  Laterality: Left;  general with regional block   COLONOSCOPY WITH PROPOFOL  10/2012   DILATATION & CURETTAGE/HYSTEROSCOPY WITH MYOSURE N/A 11/05/2020   Procedure: [place];  Surgeon: Princess Bruins, MD;  Location: [place];  Service: Gynecology;  Laterality: N/A;  request to follow in [place] block time ~10:00am requests one hour   FIDUCIAL MARKER  PLACEMENT  09/11/2021   Procedure: FIDUCIAL DYE MARKING;  Surgeon: Garner Nash, DO;  Location: [facility];  Service: Pulmonary;;   INTERCOSTAL NERVE BLOCK Right 03/26/2021   Procedure: INTERCOSTAL NERVE BLOCK;  Surgeon: Lajuana Matte, MD;  Location: [facility];  Service: Thoracic;  Laterality: Right;   INTERCOSTAL NERVE BLOCK Right 09/11/2021   Procedure: INTERCOSTAL NERVE BLOCK;  Surgeon: Lajuana Matte, MD;  Location: [facility];  Service: Thoracic;  Laterality: Right;   LYMPH NODE DISSECTION  03/26/2021   Procedure: LYMPH NODE DISSECTION;  Surgeon: Lajuana Matte, MD;  Location: [facility];  Service: Thoracic;;   LYMPH NODE DISSECTION Right 09/11/2021   Procedure: LYMPH NODE DISSECTION;  Surgeon: Lajuana Matte, MD;  Location: [facility];  Service: Thoracic;  Laterality: Right;   thorascopy Right    Robotic Assisted   VIDEO BRONCHOSCOPY WITH ENDOBRONCHIAL NAVIGATION N/A 09/11/2021   Procedure: VIDEO BRONCHOSCOPY WITH ENDOBRONCHIAL NAVIGATION;  Surgeon: Garner Nash, DO;  Location: [employer] [facility];  Service: Pulmonary;  Laterality: N/A;   Patient Active Problem List   Diagnosis Date Noted   Acute medial meniscus tear of right knee    Plica of knee, left    Dyslipidemia 10/24/2021   Hypertension 10/24/2021   Neuropathic pain 10/24/2021   S/P partial lobectomy of lung 09/11/2021   Carcinoid tumor  Malignant carcinoid tumor of the bronchus and lung ([place]) 04/18/2021   Drug induced constipation 04/18/2021   Right lower lobe pulmonary nodule 03/26/2021   Essential hypertension, benign 12/25/2019   Other forms of systemic lupus erythematosus ([place]) 12/25/2019   Sjogren's syndrome ([place]) 12/25/2019   GAD (generalized anxiety disorder) 12/25/2019   Obstructive sleep apnea treated with continuous positive airway pressure (CPAP) 09/01/2018   Sleep related headaches 01/03/2018   Snoring 01/03/2018   Hypersomnia with sleep apnea 01/03/2018   Arthralgia 01/03/2018   Multiple  neurological symptoms 12/28/2017    REFERRING DIAG: s/p Lt knee femoral chondroplasty  THERAPY DIAG:  Acute pain of left knee  Stiffness of left knee, not elsewhere classified  Difficulty in walking, not elsewhere classified  Muscle weakness (generalized)  PERTINENT HISTORY: Recent surgery for Lung CA  PRECAUTIONS: NWB until 2/14  SUBJECTIVE: tylenol as needed, taking aspirin every day. Started using heat packs.  PAIN:  Are you having pain? Yes NPRS scale: 6/10 Pain location: Lt knee Pain description: aching  Aggravating factors: movement Relieving factors: ice, rest    PATIENT SURVEYS:  FOTO primary measure 49   COGNITION:          Overall cognitive status: Within functional limits for tasks assessed                        SENSATION:          WFL       PALPATION: Warmth & edema as expected following surgery No s/s of infection at incision sites   LE AROM/PROM:   A/PROM Left 12/12/2021 Left   Hip flexion      Hip extension      Hip abduction      Hip adduction      Hip internal rotation      Hip external rotation      Knee flexion  54 60 active, 90 passive  Knee extension -10  -5  Ankle dorsiflexion      Ankle plantarflexion      Ankle inversion      Ankle eversion       (Blank rows = not tested)   LE MMT:   MMT Right 12/12/2021 Left 12/12/2021  Hip flexion      Hip extension      Hip abduction      Hip adduction      Hip internal rotation      Hip external rotation      Knee flexion      Knee extension      Ankle dorsiflexion      Ankle plantarflexion      Ankle inversion      Ankle eversion       (Blank rows = not tested)     GAIT: NWB with bilateral axillary crutches       TODAY'S TREATMENT: 2/10: PT changed and replaced gauze and tegaderm patches PROM flexion, patellar mobilizations, edema mobs on post knee Quad sets Quad set + small SLR SL hip abd with quad set 3x10 AAROM heel slides Vaso 15 min low 34 deg   EVAL: PT  removed surgical bandages, replaced with gauze & tegaderm patches Manual passive motion of knee- minimal tolerance at first but improved Discussed HEP & importance of movement VAso 15 min, low compression during discussion time       PATIENT EDUCATION:  Education details: Anatomy of condition, POC, HEP, exercise form/rationale   Person educated: Patient  and Spouse Education method: Explanation, Demonstration, Tactile cues, and Verbal cues Education comprehension: verbalized understanding, returned demonstration, verbal cues required, tactile cues required, and needs further education     HOME EXERCISE PROGRAM: [email or web address] - emailed at eval   ASSESSMENT:   CLINICAL IMPRESSION:  Mild quad lag noted in SLR without noted pain. Edema is well controlled and incisions are healing well without s/s of infection. Discussed use of forearm crutches as a possibility due to irritation to rib cage surgical site irritation from axillary crutches.     Objective impairments include decreased activity tolerance, decreased mobility, difficulty walking, decreased ROM, decreased strength, increased edema, impaired flexibility, and pain. These impairments are limiting patient from cleaning, community activity, driving, meal prep, laundry, yard work, and shopping. Personal factors including Fitness and 1 comorbidity: CA  are also affecting patient's functional outcome. Patient will benefit from skilled PT to address above impairments and improve overall function.   REHAB POTENTIAL: Good   CLINICAL DECISION MAKING: Stable/uncomplicated   EVALUATION COMPLEXITY: Low     GOALS: Goals reviewed with patient? Yes   SHORT TERM GOALS:   STG Name Target Date Goal status  1 Knee flexion to 90 deg without incr pain Baseline: passive to 15 but active to 52 at eval 01/02/2022 INITIAL  2 Demo good proximal hip activation & proper form in HEP Baseline: will cont to progress and educate as appropriae 01/02/2022 INITIAL                                                  LONG TERM GOALS:    LTG Name Target Date Goal status  1 Able to ambulate during daily activities pain <=2/10 Baseline: unable at eval 02/06/2022 INITIAL  2 Pt will navigate stairs at home step over step Baseline: 02/06/2022 INITIAL  3 Knee extension strength to 90% of non-surgical leg on dynamometry Baseline:not appropriate to test at eval 02/06/2022 INITIAL  4 Able to navigate soft/unstable surfaces such as the yard outside with good control and without limitation by knee pain Baseline: unable at eval 02/06/2022 INITIAL                               PLAN: PT FREQUENCY: 1-2x/week   PT DURATION: 8 weeks   PLANNED INTERVENTIONS: Therapeutic exercises, Therapeutic activity, Neuro Muscular re-education, Balance training, Gait training, Patient/Family education, Joint mobilization, Stair training, Dry Needling, Cryotherapy, Moist heat, scar mobilization, Taping, Vasopneumatic device, and Manual therapy   PLAN FOR NEXT SESSION: cont passive motion as tolerated, progress proximal strength exercises, begin progressive WB on the 14th   Chandel Zaun C. Charlestine Rookstool PT, DPT 12/19/21 11:52 AM

## 2021-12-26 ENCOUNTER — Other Ambulatory Visit: Payer: Self-pay

## 2021-12-26 ENCOUNTER — Ambulatory Visit (HOSPITAL_BASED_OUTPATIENT_CLINIC_OR_DEPARTMENT_OTHER): Payer: 59 | Admitting: Physical Therapy

## 2021-12-26 ENCOUNTER — Encounter (HOSPITAL_BASED_OUTPATIENT_CLINIC_OR_DEPARTMENT_OTHER): Payer: Self-pay | Admitting: Physical Therapy

## 2021-12-26 ENCOUNTER — Ambulatory Visit (INDEPENDENT_AMBULATORY_CARE_PROVIDER_SITE_OTHER): Payer: 59 | Admitting: Orthopaedic Surgery

## 2021-12-26 DIAGNOSIS — M25562 Pain in left knee: Secondary | ICD-10-CM | POA: Diagnosis not present

## 2021-12-26 DIAGNOSIS — S83242A Other tear of medial meniscus, current injury, left knee, initial encounter: Secondary | ICD-10-CM

## 2021-12-26 DIAGNOSIS — M25662 Stiffness of left knee, not elsewhere classified: Secondary | ICD-10-CM

## 2021-12-26 DIAGNOSIS — S83241A Other tear of medial meniscus, current injury, right knee, initial encounter: Secondary | ICD-10-CM

## 2021-12-26 DIAGNOSIS — G8929 Other chronic pain: Secondary | ICD-10-CM | POA: Diagnosis not present

## 2021-12-26 DIAGNOSIS — M6281 Muscle weakness (generalized): Secondary | ICD-10-CM

## 2021-12-26 DIAGNOSIS — R262 Difficulty in walking, not elsewhere classified: Secondary | ICD-10-CM

## 2021-12-26 NOTE — Progress Notes (Signed)
Post Operative Evaluation    Procedure/Date of Surgery: left knee medial root 1/31  Interval History:   Presents today for 2-week follow-up of the above procedure she is doing extremely well.  She has essentially minimal pain and is only taking Tylenol.  She has been compliant with brace use and nonweightbearing.  She is working with physical therapy.  She has been taking her aspirin.   PMH/PSH/Family History/Social History/Meds/Allergies:    Past Medical History:  Diagnosis Date   Dyspnea    Dysrhythmia    Endometrial polyp    Family history of adverse reaction to anesthesia    Patient states her mother had an adverse reaction to anesthesia but she is not sure exactly what it was   Fibromyalgia    GAD (generalized anxiety disorder)    History of panic attacks    Hypertension    followed by pcp   Medical history non-contributory    Neuropathy    OSA on CPAP    followed by dr dohmeier--- per study 02-02-2018 moderate to severe osa   Right bundle branch block    Sjogren's disease ([place])    Systemic lupus ([place])    rheumonotologist-- dr Page Spiro   Past Surgical History:  Procedure Laterality Date   [place];  1989   CHONDROPLASTY Left 12/09/2021   Procedure: LEFT KNEE MEDIAL ROOT REPAIR, FEMORAL CHONDROPLASTY;  Surgeon: Vanetta Mulders, MD;  Location: [facility];  Service: Orthopedics;  Laterality: Left;  general with regional block   COLONOSCOPY WITH PROPOFOL  10/2012   DILATATION & CURETTAGE/HYSTEROSCOPY WITH MYOSURE N/A 11/05/2020   Procedure: [place];  Surgeon: Princess Bruins, MD;  Location: [place] [facility];  Service: Gynecology;  Laterality: N/A;  request to follow in [place] block time ~10:00am requests one hour   FIDUCIAL MARKER PLACEMENT  09/11/2021   Procedure: FIDUCIAL DYE MARKING;  Surgeon: Garner Nash, DO;  Location: [facility];   Service: Pulmonary;;   INTERCOSTAL NERVE BLOCK Right 03/26/2021   Procedure: INTERCOSTAL NERVE BLOCK;  Surgeon: Lajuana Matte, MD;  Location: [facility];  Service: Thoracic;  Laterality: Right;   INTERCOSTAL NERVE BLOCK Right 09/11/2021   Procedure: INTERCOSTAL NERVE BLOCK;  Surgeon: Lajuana Matte, MD;  Location: [facility];  Service: Thoracic;  Laterality: Right;   LYMPH NODE DISSECTION  03/26/2021   Procedure: LYMPH NODE DISSECTION;  Surgeon: Lajuana Matte, MD;  Location: [facility];  Service: Thoracic;;   LYMPH NODE DISSECTION Right 09/11/2021   Procedure: LYMPH NODE DISSECTION;  Surgeon: Lajuana Matte, MD;  Location: [facility];  Service: Thoracic;  Laterality: Right;   thorascopy Right    Robotic Assisted   VIDEO BRONCHOSCOPY WITH ENDOBRONCHIAL NAVIGATION N/A 09/11/2021   Procedure: VIDEO BRONCHOSCOPY WITH ENDOBRONCHIAL NAVIGATION;  Surgeon: Garner Nash, DO;  Location: [facility];  Service: Pulmonary;  Laterality: N/A;   Social History   Socioeconomic History   Marital status: Married    Spouse name: Not on file   Number of children: 2   Years of education: post-grad   Highest education level: Not on file  Occupational History   Not on file  Tobacco Use   Smoking status: Former    Years: 10.00    Types: Cigarettes    Quit date:  1989    Years since quitting: 34.1   Smokeless tobacco: Never  Vaping Use   Vaping Use: Never used  Substance and Sexual Activity   Alcohol use: Yes    Comment: occasional- social   Drug use: Never   Sexual activity: Yes    Partners: Male    Birth control/protection: Post-menopausal    Comment: 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23, PARTNERS- 3, MARRIED- 35 YRS   Other Topics Concern   Not on file  Social History Narrative   Lives at home with her husband   Right handed   2 cups of caffeine daily   Social Determinants of Health   Financial Resource Strain: Not on file  Food Insecurity: Not on file  Transportation Needs: Not on file  Physical  Activity: Not on file  Stress: Not on file  Social Connections: Not on file   Family History  Problem Relation Age of Onset   Hypertension Mother    Osteoarthritis Mother    Cancer Father        PROSTATE   Hypertension Father    Heart failure Father    Lung disease Father    Breast cancer Sister    Hypertension Sister    Lupus Sister    Sarcoidosis Paternal Aunt    Cancer Brother        PROSTATE   Lupus Brother    Hypertension Brother    Hypertension Brother    Hypertension Brother    Allergies  Allergen Reactions   Penicillins Anaphylaxis   Current Outpatient Medications  Medication Sig Dispense Refill   acetaminophen (TYLENOL) 500 MG tablet Take 1,000 mg by mouth every 6 (six) hours as needed for moderate pain or headache.     ALPRAZolam (XANAX) 0.25 MG tablet Take 0.25 mg by mouth 2 (two) times daily as needed for anxiety.     aspirin EC 325 MG tablet Take 1 tablet (325 mg total) by mouth daily. 30 tablet 0   Cyanocobalamin (B-12) 3000 MCG CAPS Take 3,000 mg by mouth daily.     DULoxetine (CYMBALTA) 30 MG capsule Take 1 capsule (30 mg total) by mouth 2 (two) times daily. 180 capsule 3   fluticasone (FLOVENT HFA) 110 MCG/ACT inhaler Inhale 2 puffs into the lungs daily. (Patient not taking: Reported on 09/19/2021) 12 g 3   HYDROcodone-acetaminophen (NORCO/VICODIN) 5-325 MG tablet Take 1 tablet by mouth every 4 (four) hours as needed for moderate pain. (Patient not taking: Reported on 10/24/2021) 30 tablet 0   linaclotide (LINZESS) 145 MCG CAPS capsule Take 1 capsule (145 mcg total) by mouth daily before breakfast. 30 capsule 2   losartan-hydrochlorothiazide (HYZAAR) 50-12.5 MG tablet Take 1 tablet by mouth daily. 90 tablet 3   MELATONIN PO Take 1 capsule by mouth at bedtime as needed (sleep).     METAMUCIL FIBER PO Take 1 Scoop by mouth daily as needed (constipation).     OVER THE COUNTER MEDICATION Take 2 tablets by mouth daily. Olly digestive enzyme blend     oxyCODONE  (OXY IR/ROXICODONE) 5 MG immediate release tablet Take 1 tablet (5 mg total) by mouth every 4 (four) hours as needed (severe pain). 20 tablet 0   pregabalin (LYRICA) 25 MG capsule Take 1 capsule (25 mg total) by mouth 2 (two) times daily. (Patient not taking: Reported on 10/24/2021) 60 capsule 0   Respiratory Therapy Supplies (CARETOUCH 2 CPAP HOSE HANGER) MISC by Does not apply route.     rosuvastatin (CRESTOR) 10 MG tablet Take  1 tablet (10 mg total) by mouth daily. 90 tablet 3   No current facility-administered medications for this visit.   No results found.  Review of Systems:   A ROS was performed including pertinent positives and negatives as documented in the HPI.   Musculoskeletal Exam:    There were no vitals taken for this visit.  Incisions are clean dry and intact able to extend to 0 and flex to 90 degrees.  No joint line tenderness.  Sensation intact all distributions.  Imaging:    None  I personally reviewed and interpreted the radiographs.   Assessment:   61 year old female 2 weeks status post medial meniscal root repair.  She is overall doing very well.  At this time I would like her to advance from 50% to 100% weightbearing over the course the next 2 weeks.  I discussed that her only precautions are strenuous activity beyond 90 degrees of flexion.  I would like her to also wean out of her brace as her quadricep strength improves and she is able to do 10 straight leg raises without difficulty  Plan :    -Return to clinic in 4 weeks     I personally saw and evaluated the patient, and participated in the management and treatment plan.  Vanetta Mulders, MD Attending Physician, Orthopedic Surgery  This document was dictated using Dragon voice recognition software. A reasonable attempt at proof reading has been made to minimize errors.

## 2021-12-26 NOTE — Therapy (Signed)
OUTPATIENT PHYSICAL THERAPY TREATMENT NOTE   Patient Name: Patricia Mathews MRN: [id number] DOB:Jan 28, 1961, 61 y.o., female Today's Date: 12/26/2021  PCP: Horald Pollen, MD REFERRING PROVIDER: Scott, [place] - 12/26/21 1202     Visit Number 3    Number of Visits 17    Date for PT Re-Evaluation 02/06/22    Authorization Type [facility] UMR    PT Start Time 0845    PT Stop Time 0928    PT Time Calculation (min) 43 min    Activity Tolerance Patient tolerated treatment well    Behavior During Therapy [facility] for tasks assessed/performed              Past Medical History:  Diagnosis Date   Dyspnea    Dysrhythmia    Endometrial polyp    Family history of adverse reaction to anesthesia    Patient states her mother had an adverse reaction to anesthesia but she is not sure exactly what it was   Fibromyalgia    GAD (generalized anxiety disorder)    History of panic attacks    Hypertension    followed by pcp   Medical history non-contributory    Neuropathy    OSA on CPAP    followed by dr dohmeier--- per study 02-02-2018 moderate to severe osa   Right bundle branch block    Sjogren's disease ([place])    Systemic lupus ([place])    rheumonotologist-- dr Page Spiro   Past Surgical History:  Procedure Laterality Date   [place];  1989   CHONDROPLASTY Left 12/09/2021   Procedure: LEFT KNEE MEDIAL ROOT REPAIR, FEMORAL CHONDROPLASTY;  Surgeon: Vanetta Mulders, MD;  Location: [place];  Service: Orthopedics;  Laterality: Left;  general with regional block   COLONOSCOPY WITH PROPOFOL  10/2012   DILATATION & CURETTAGE/HYSTEROSCOPY WITH MYOSURE N/A 11/05/2020   Procedure: [place];  Surgeon: Princess Bruins, MD;  Location: [place];  Service: Gynecology;  Laterality: N/A;  request to follow in [place] block time ~10:00am requests one hour   FIDUCIAL MARKER  PLACEMENT  09/11/2021   Procedure: FIDUCIAL DYE MARKING;  Surgeon: Garner Nash, DO;  Location: [facility];  Service: Pulmonary;;   INTERCOSTAL NERVE BLOCK Right 03/26/2021   Procedure: INTERCOSTAL NERVE BLOCK;  Surgeon: Lajuana Matte, MD;  Location: [facility];  Service: Thoracic;  Laterality: Right;   INTERCOSTAL NERVE BLOCK Right 09/11/2021   Procedure: INTERCOSTAL NERVE BLOCK;  Surgeon: Lajuana Matte, MD;  Location: [facility];  Service: Thoracic;  Laterality: Right;   LYMPH NODE DISSECTION  03/26/2021   Procedure: LYMPH NODE DISSECTION;  Surgeon: Lajuana Matte, MD;  Location: [facility];  Service: Thoracic;;   LYMPH NODE DISSECTION Right 09/11/2021   Procedure: LYMPH NODE DISSECTION;  Surgeon: Lajuana Matte, MD;  Location: [place];  Service: Thoracic;  Laterality: Right;   thorascopy Right    Robotic Assisted   VIDEO BRONCHOSCOPY WITH ENDOBRONCHIAL NAVIGATION N/A 09/11/2021   Procedure: VIDEO BRONCHOSCOPY WITH ENDOBRONCHIAL NAVIGATION;  Surgeon: Garner Nash, DO;  Location: [place];  Service: Pulmonary;  Laterality: N/A;   Patient Active Problem List   Diagnosis Date Noted   Acute medial meniscus tear of right knee    [place] of knee, left    Dyslipidemia 10/24/2021   Hypertension 10/24/2021   Neuropathic pain 10/24/2021   S/P partial lobectomy of lung 09/11/2021   Carcinoid tumor  Malignant carcinoid tumor of the bronchus and lung ([place]) 04/18/2021   Drug induced constipation 04/18/2021   Right lower lobe pulmonary nodule 03/26/2021   Essential hypertension, benign 12/25/2019   Other forms of systemic lupus erythematosus ([place]) 12/25/2019   Sjogren's syndrome ([place]) 12/25/2019   GAD (generalized anxiety disorder) 12/25/2019   Obstructive sleep apnea treated with continuous positive airway pressure (CPAP) 09/01/2018   Sleep related headaches 01/03/2018   Snoring 01/03/2018   Hypersomnia with sleep apnea 01/03/2018   Arthralgia 01/03/2018   Multiple  neurological symptoms 12/28/2017    REFERRING DIAG: s/p Lt knee femoral chondroplasty  THERAPY DIAG:  Acute pain of left knee  Stiffness of left knee, not elsewhere classified  Difficulty in walking, not elsewhere classified  Muscle weakness (generalized)  PERTINENT HISTORY: Recent surgery for Lung CA  PRECAUTIONS: NWB until 2/14  SUBJECTIVE: The oatient has been to the MD. The MD cleared her for 50% weight bearing with progression to full weight bearing in a month  PAIN:  Are you having pain? Yes NPRS scale: 6/10 Pain location: Lt knee Pain description: aching  Aggravating factors: movement Relieving factors: ice, rest    PATIENT SURVEYS:  FOTO primary measure 49   COGNITION:          Overall cognitive status: Within functional limits for tasks assessed                        SENSATION:          WFL       PALPATION: Warmth & edema as expected following surgery No s/s of infection at incision sites   LE AROM/PROM:   A/PROM Left 12/12/2021 Left   Hip flexion      Hip extension      Hip abduction      Hip adduction      Hip internal rotation      Hip external rotation      Knee flexion  54 60 active, 90 passive  Knee extension -10  -5  Ankle dorsiflexion      Ankle plantarflexion      Ankle inversion      Ankle eversion       (Blank rows = not tested)   LE MMT:   MMT Right 12/12/2021 Left 12/12/2021  Hip flexion      Hip extension      Hip abduction      Hip adduction      Hip internal rotation      Hip external rotation      Knee flexion      Knee extension      Ankle dorsiflexion      Ankle plantarflexion      Ankle inversion      Ankle eversion       (Blank rows = not tested)     GAIT: NWB with bilateral axillary crutches       TODAY'S TREATMENT: 2/17 3x10 quad set  SLR small range 4x8  SL SLR 2x10  Reviewed self extension stretch for home.   Reviewed HEP at home. Patient reports she was trying to get down on the floor for  ankle pumps. She was advised she can do them any time from any position.   Standing weight shift fwd and lateral x10 with 50% weight bearing. Patient hesitant t first but had no major pain  Gait: reviewed set up with walker. Reviewed 50% weight bearing. Patient felt much more  comfortable with walker. Difficulty with crutches 2nd to cancer surgery.  Manual: PROM flexion, patellar mobilizations, edema mobs on post knee;   Trigger poin release to the quad     2/10: PT changed and replaced gauze and tegaderm patches PROM flexion, patellar mobilizations, edema mobs on post knee Quad sets Quad set + small SLR SL hip abd with quad set 3x10 AAROM heel slides Vaso 15 min low 34 deg   EVAL: PT removed surgical bandages, replaced with gauze & tegaderm patches Manual passive motion of knee- minimal tolerance at first but improved Discussed HEP & importance of movement VAso 15 min, low compression during discussion time       PATIENT EDUCATION:  Education details: Anatomy of condition, POC, HEP, exercise form/rationale   Person educated: Patient and Spouse Education method: Explanation, Demonstration, Tactile cues, and Verbal cues Education comprehension: verbalized understanding, returned demonstration, verbal cues required, tactile cues required, and needs further education     HOME EXERCISE PROGRAM: [email or web address] - emailed at eval   ASSESSMENT:   CLINICAL IMPRESSION: The patient is making good progress. We reviewed what 50% weight bearing looks like. Per MD she can progress off a device over the next 4 weeks. She was given a script for a walker. She had mild pain with PROM today but otherwise tolerated well. Total arc was measured at 5-80. We reviewed self stretching for home. She was advised not to stretch over 90 degrees for the next 2 weeks. She had less of an extensor lag today with SLR and she did not touch down in between reps. Therapy will continue to advance as tolerated.   Mild  quad lag noted in SLR without noted pain. Edema is well controlled and incisions are healing well without s/s of infection. Discussed use of forearm crutches as a possibility due to irritation to rib cage surgical site irritation from axillary crutches.     Objective impairments include decreased activity tolerance, decreased mobility, difficulty walking, decreased ROM, decreased strength, increased edema, impaired flexibility, and pain. These impairments are limiting patient from cleaning, community activity, driving, meal prep, laundry, yard work, and shopping. Personal factors including Fitness and 1 comorbidity: CA  are also affecting patient's functional outcome. Patient will benefit from skilled PT to address above impairments and improve overall function.   REHAB POTENTIAL: Good   CLINICAL DECISION MAKING: Stable/uncomplicated   EVALUATION COMPLEXITY: Low     GOALS: Goals reviewed with patient? Yes   SHORT TERM GOALS:   STG Name Target Date Goal status  1 Knee flexion to 90 deg without incr pain Baseline: passive to 15 but active to 52 at eval 01/02/2022 INITIAL  2 Demo good proximal hip activation & proper form in HEP Baseline: will cont to progress and educate as appropriae 01/02/2022 INITIAL                                                 LONG TERM GOALS:    LTG Name Target Date Goal status  1 Able to ambulate during daily activities pain <=2/10 Baseline: unable at eval 02/06/2022 INITIAL  2 Pt will navigate stairs at home step over step Baseline: 02/06/2022 INITIAL  3 Knee extension strength to 90% of non-surgical leg on dynamometry Baseline:not appropriate to test at eval 02/06/2022 INITIAL  4 Able to navigate soft/unstable surfaces such as the yard outside  with good control and without limitation by knee pain Baseline: unable at eval 02/06/2022 INITIAL                               PLAN: PT FREQUENCY: 1-2x/week   PT DURATION: 8 weeks   PLANNED INTERVENTIONS:  Therapeutic exercises, Therapeutic activity, Neuro Muscular re-education, Balance training, Gait training, Patient/Family education, Joint mobilization, Stair training, Dry Needling, Cryotherapy, Moist heat, scar mobilization, Taping, Vasopneumatic device, and Manual therapy   PLAN FOR NEXT SESSION: cont passive motion as tolerated, progress proximal strength exercises, begin progressive WB on the 14th    12/26/21 12:05 PM

## 2022-01-01 NOTE — Therapy (Signed)
OUTPATIENT PHYSICAL THERAPY TREATMENT NOTE   Patient Name: Patricia Mathews MRN: [id number] DOB:06/08/1961, 61 y.o., female Today's Date: 01/02/2022  PCP: Horald Pollen, MD [place], MD   PT End of Session - 01/02/22 1017     Visit Number 4    Number of Visits 17    Date for PT Re-Evaluation 02/06/22    Authorization Type [facility]    PT Start Time 3664    PT Stop Time 1113    PT Time Calculation (min) 58 min    Activity Tolerance Patient tolerated treatment well;Patient limited by pain    Behavior During Therapy WFL for tasks assessed/performed               Past Medical History:  Diagnosis Date   Dyspnea    Dysrhythmia    Endometrial polyp    Family history of adverse reaction to anesthesia    Patient states her mother had an adverse reaction to anesthesia but she is not sure exactly what it was   Fibromyalgia    GAD (generalized anxiety disorder)    History of panic attacks    Hypertension    followed by pcp   Medical history non-contributory    Neuropathy    OSA on CPAP    followed by dr dohmeier--- per study 02-02-2018 moderate to severe osa   Right bundle branch block    Sjogren's disease ([place])    Systemic lupus ([place])    rheumonotologist-- dr Page Spiro   Past Surgical History:  Procedure Laterality Date   [place];  1989   CHONDROPLASTY Left 12/09/2021   Procedure: LEFT KNEE MEDIAL ROOT REPAIR, FEMORAL CHONDROPLASTY;  Surgeon: Vanetta Mulders, MD;  Location: [facility];  Service: Orthopedics;  Laterality: Left;  general with regional block   COLONOSCOPY WITH PROPOFOL  10/2012   DILATATION & CURETTAGE/HYSTEROSCOPY WITH MYOSURE N/A 11/05/2020   Procedure: [place];  Surgeon: Princess Bruins, MD;  Location: [facility];  Service: Gynecology;  Laterality: N/A;  request to follow in [place] block time ~10:00am requests one hour    FIDUCIAL MARKER PLACEMENT  09/11/2021   Procedure: FIDUCIAL DYE MARKING;  Surgeon: Garner Nash, DO;  Location: [facility];  Service: Pulmonary;;   INTERCOSTAL NERVE BLOCK Right 03/26/2021   Procedure: INTERCOSTAL NERVE BLOCK;  Surgeon: Lajuana Matte, MD;  Location: [facility];  Service: Thoracic;  Laterality: Right;   INTERCOSTAL NERVE BLOCK Right 09/11/2021   Procedure: INTERCOSTAL NERVE BLOCK;  Surgeon: Lajuana Matte, MD;  Location: [facility];  Service: Thoracic;  Laterality: Right;   LYMPH NODE DISSECTION  03/26/2021   Procedure: LYMPH NODE DISSECTION;  Surgeon: Lajuana Matte, MD;  Location: [facility];  Service: Thoracic;;   LYMPH NODE DISSECTION Right 09/11/2021   Procedure: LYMPH NODE DISSECTION;  Surgeon: Lajuana Matte, MD;  Location: [facility];  Service: Thoracic;  Laterality: Right;   thorascopy Right    Robotic Assisted   VIDEO BRONCHOSCOPY WITH ENDOBRONCHIAL NAVIGATION N/A 09/11/2021   Procedure: VIDEO BRONCHOSCOPY WITH ENDOBRONCHIAL NAVIGATION;  Surgeon: Garner Nash, DO;  Location: [facility];  Service: Pulmonary;  Laterality: N/A;   Patient Active Problem List   Diagnosis Date Noted   Acute medial meniscus tear of right knee    Plica of knee, left    Dyslipidemia 10/24/2021   Hypertension 10/24/2021   Neuropathic pain 10/24/2021   S/P partial lobectomy of lung 09/11/2021  Carcinoid tumor    Malignant carcinoid tumor of the bronchus and lung ([place]) 04/18/2021   Drug induced constipation 04/18/2021   Right lower lobe pulmonary nodule 03/26/2021   Essential hypertension, benign 12/25/2019   Other forms of systemic lupus erythematosus ([place]) 12/25/2019   Sjogren's syndrome ([place]) 12/25/2019   GAD (generalized anxiety disorder) 12/25/2019   Obstructive sleep apnea treated with continuous positive airway pressure (CPAP) 09/01/2018   Sleep related headaches 01/03/2018   Snoring 01/03/2018   Hypersomnia with sleep apnea 01/03/2018   Arthralgia 01/03/2018    Multiple neurological symptoms 12/28/2017    REFERRING DIAG: s/p Lt knee femoral chondroplasty  THERAPY DIAG:  Acute pain of left knee  Stiffness of left knee, not elsewhere classified  Muscle weakness (generalized)  Difficulty in walking, not elsewhere classified  PERTINENT HISTORY: Recent surgery for Lung CA  PRECAUTIONS: NWB until 2/14  SUBJECTIVE: I can walk without the walker, I get sore.  I took tylenol last night.   PAIN:  Are you having pain? NO NPRS scale: 0/10; last night about 6/10 Pain location: Lt knee Pain description: aching  Aggravating factors: movement Relieving factors: ice, rest    PATIENT SURVEYS:  FOTO primary measure 49   COGNITION:          Overall cognitive status: Within functional limits for tasks assessed                        SENSATION:          WFL       PALPATION: Warmth & edema as expected following surgery No s/s of infection at incision sites   LE AROM/PROM:   A/PROM Left 12/12/2021 Left  Left 01/02/22  Hip flexion       Hip extension       Hip abduction       Hip adduction       Hip internal rotation       Hip external rotation       Knee flexion  54 60 active, 90 passive 117 with strap pull  Knee extension -10  -5   Ankle dorsiflexion       Ankle plantarflexion       Ankle inversion       Ankle eversion        (Blank rows = not tested)   LE MMT:   MMT Right 12/12/2021 Left 12/12/2021  Hip flexion      Hip extension      Hip abduction      Hip adduction      Hip internal rotation      Hip external rotation      Knee flexion      Knee extension      Ankle dorsiflexion      Ankle plantarflexion      Ankle inversion      Ankle eversion       (Blank rows = not tested)     GAIT: NWB with bilateral axillary crutches       TODAY'S TREATMENT: 2/24: MANUAL: edema mobilization from ankle to knee, passive knee flexion stretching in prone, STM to VL with knee flexed AAROM knee flexion with strap Retro  stepping, walking without AD Quad set in long sitting VASO- medium 15 min 34 deg   2/17 3x10 quad set  SLR small range 4x8  SL SLR 2x10  Reviewed self extension stretch for home.   Reviewed HEP at home. Patient reports  she was trying to get down on the floor for ankle pumps. She was advised she can do them any time from any position.   Standing weight shift fwd and lateral x10 with 50% weight bearing. Patient hesitant t first but had no major pain  Gait: reviewed set up with walker. Reviewed 50% weight bearing. Patient felt much more comfortable with walker. Difficulty with crutches 2nd to cancer surgery.  Manual: PROM flexion, patellar mobilizations, edema mobs on post knee;   Trigger poin release to the quad     2/10: PT changed and replaced gauze and tegaderm patches PROM flexion, patellar mobilizations, edema mobs on post knee Quad sets Quad set + small SLR SL hip abd with quad set 3x10 AAROM heel slides Vaso 15 min low 34 deg   EVAL: PT removed surgical bandages, replaced with gauze & tegaderm patches Manual passive motion of knee- minimal tolerance at first but improved Discussed HEP & importance of movement VAso 15 min, low compression during discussion time       PATIENT EDUCATION:  Education details: Anatomy of condition, POC, HEP, exercise form/rationale   Person educated: Patient and Spouse Education method: Explanation, Demonstration, Tactile cues, and Verbal cues Education comprehension: verbalized understanding, returned demonstration, verbal cues required, tactile cues required, and needs further education     HOME EXERCISE PROGRAM: [id number] - emailed at eval   ASSESSMENT:   CLINICAL IMPRESSION: Pt ambulates with excellent gait pattern without walker. She continues to have poor quad contraction, however so she will continue to use the walker for longer distances and outside of the home. Edema through calf reduced with manual therapy, apparent  beginning of Baker's cyst forming and will continue to monitor.      Objective impairments include decreased activity tolerance, decreased mobility, difficulty walking, decreased ROM, decreased strength, increased edema, impaired flexibility, and pain. These impairments are limiting patient from cleaning, community activity, driving, meal prep, laundry, yard work, and shopping. Personal factors including Fitness and 1 comorbidity: CA  are also affecting patient's functional outcome. Patient will benefit from skilled PT to address above impairments and improve overall function.   REHAB POTENTIAL: Good   CLINICAL DECISION MAKING: Stable/uncomplicated   EVALUATION COMPLEXITY: Low     GOALS: Goals reviewed with patient? Yes   SHORT TERM GOALS:   STG Name Target Date Goal status  1 Knee flexion to 90 deg without incr pain Baseline: passes 90 deg but experienced pain 01/02/2022 Partially met  2 Demo good proximal hip activation & proper form in HEP Baseline: will cont to progress and educate as appropriae 01/02/2022 achieved                                                 LONG TERM GOALS:    LTG Name Target Date Goal status  1 Able to ambulate during daily activities pain <=2/10 Baseline: unable at eval 02/06/2022 INITIAL  2 Pt will navigate stairs at home step over step Baseline: 02/06/2022 INITIAL  3 Knee extension strength to 90% of non-surgical leg on dynamometry Baseline:not appropriate to test at eval 02/06/2022 INITIAL  4 Able to navigate soft/unstable surfaces such as the yard outside with good control and without limitation by knee pain Baseline: unable at eval 02/06/2022 INITIAL  PLAN: PT FREQUENCY: 1-2x/week   PT DURATION: 8 weeks   PLANNED INTERVENTIONS: Therapeutic exercises, Therapeutic activity, Neuro Muscular re-education, Balance training, Gait training, Patient/Family education, Joint mobilization, Stair training, Dry Needling,  Cryotherapy, Moist heat, scar mobilization, Taping, Vasopneumatic device, and Manual therapy   PLAN FOR NEXT SESSION: continue quad activation  Andrey Hoobler C. Madeleine Fenn PT, DPT 01/02/22 8:46 PM

## 2022-01-02 ENCOUNTER — Other Ambulatory Visit: Payer: Self-pay

## 2022-01-02 ENCOUNTER — Encounter (HOSPITAL_BASED_OUTPATIENT_CLINIC_OR_DEPARTMENT_OTHER): Payer: Self-pay | Admitting: Physical Therapy

## 2022-01-02 ENCOUNTER — Ambulatory Visit (HOSPITAL_BASED_OUTPATIENT_CLINIC_OR_DEPARTMENT_OTHER): Payer: 59 | Admitting: Physical Therapy

## 2022-01-02 DIAGNOSIS — M25562 Pain in left knee: Secondary | ICD-10-CM | POA: Diagnosis not present

## 2022-01-02 DIAGNOSIS — M25662 Stiffness of left knee, not elsewhere classified: Secondary | ICD-10-CM

## 2022-01-02 DIAGNOSIS — R262 Difficulty in walking, not elsewhere classified: Secondary | ICD-10-CM

## 2022-01-02 DIAGNOSIS — G8929 Other chronic pain: Secondary | ICD-10-CM | POA: Diagnosis not present

## 2022-01-02 DIAGNOSIS — M6281 Muscle weakness (generalized): Secondary | ICD-10-CM

## 2022-01-15 NOTE — Therapy (Signed)
OUTPATIENT PHYSICAL THERAPY TREATMENT NOTE   Patient Name: Patricia Mathews MRN: [id number] DOB:1961-07-16, 61 y.o., female Today's Date: 01/16/2022  PCP: Horald Pollen, MD [place], MD   PT End of Session - 01/16/22 [phone]     Visit Number 5    Number of Visits 17    Date for PT Re-Evaluation 02/06/22    Authorization Type [facility]    PT Start Time 0845    PT Stop Time 0924    PT Time Calculation (min) 39 min    Activity Tolerance Patient tolerated treatment well    Behavior During Therapy [facility] for tasks assessed/performed                Past Medical History:  Diagnosis Date   Dyspnea    Dysrhythmia    Endometrial polyp    Family history of adverse reaction to anesthesia    Patient states her mother had an adverse reaction to anesthesia but she is not sure exactly what it was   Fibromyalgia    GAD (generalized anxiety disorder)    History of panic attacks    Hypertension    followed by pcp   Medical history non-contributory    Neuropathy    OSA on CPAP    followed by dr dohmeier--- per study 02-02-2018 moderate to severe osa   Right bundle branch block    Sjogren's disease ([place])    Systemic lupus ([place])    rheumonotologist-- dr Page Spiro   Past Surgical History:  Procedure Laterality Date   [place];  1989   CHONDROPLASTY Left 12/09/2021   Procedure: LEFT KNEE MEDIAL ROOT REPAIR, FEMORAL CHONDROPLASTY;  Surgeon: Vanetta Mulders, MD;  Location: [facility];  Service: Orthopedics;  Laterality: Left;  general with regional block   COLONOSCOPY WITH PROPOFOL  10/2012   DILATATION & CURETTAGE/HYSTEROSCOPY WITH MYOSURE N/A 11/05/2020   Procedure: [place];  Surgeon: Princess Bruins, MD;  Location: [facility];  Service: Gynecology;  Laterality: N/A;  request to follow in [place] block time ~10:00am requests one hour   FIDUCIAL MARKER PLACEMENT   09/11/2021   Procedure: FIDUCIAL DYE MARKING;  Surgeon: Garner Nash, DO;  Location: [facility];  Service: Pulmonary;;   INTERCOSTAL NERVE BLOCK Right 03/26/2021   Procedure: INTERCOSTAL NERVE BLOCK;  Surgeon: Lajuana Matte, MD;  Location: [facility];  Service: Thoracic;  Laterality: Right;   INTERCOSTAL NERVE BLOCK Right 09/11/2021   Procedure: INTERCOSTAL NERVE BLOCK;  Surgeon: Lajuana Matte, MD;  Location: [facility];  Service: Thoracic;  Laterality: Right;   LYMPH NODE DISSECTION  03/26/2021   Procedure: LYMPH NODE DISSECTION;  Surgeon: Lajuana Matte, MD;  Location: [facility];  Service: Thoracic;;   LYMPH NODE DISSECTION Right 09/11/2021   Procedure: LYMPH NODE DISSECTION;  Surgeon: Lajuana Matte, MD;  Location: [facility];  Service: Thoracic;  Laterality: Right;   thorascopy Right    Robotic Assisted   VIDEO BRONCHOSCOPY WITH ENDOBRONCHIAL NAVIGATION N/A 09/11/2021   Procedure: VIDEO BRONCHOSCOPY WITH ENDOBRONCHIAL NAVIGATION;  Surgeon: Garner Nash, DO;  Location: [facility];  Service: Pulmonary;  Laterality: N/A;   Patient Active Problem List   Diagnosis Date Noted   Acute medial meniscus tear of right knee    Plica of knee, left    Dyslipidemia 10/24/2021   Hypertension 10/24/2021   Neuropathic pain 10/24/2021   S/P partial lobectomy of lung 09/11/2021   Carcinoid tumor  Malignant carcinoid tumor of the bronchus and lung ([place]) 04/18/2021   Drug induced constipation 04/18/2021   Right lower lobe pulmonary nodule 03/26/2021   Essential hypertension, benign 12/25/2019   Other forms of systemic lupus erythematosus ([place]) 12/25/2019   Sjogren's syndrome ([place]) 12/25/2019   GAD (generalized anxiety disorder) 12/25/2019   Obstructive sleep apnea treated with continuous positive airway pressure (CPAP) 09/01/2018   Sleep related headaches 01/03/2018   Snoring 01/03/2018   Hypersomnia with sleep apnea 01/03/2018   Arthralgia 01/03/2018   Multiple neurological  symptoms 12/28/2017    REFERRING DIAG: s/p Lt knee femoral chondroplasty  THERAPY DIAG:  Acute pain of left knee  Stiffness of left knee, not elsewhere classified  Muscle weakness (generalized)  Difficulty in walking, not elsewhere classified  PERTINENT HISTORY: Recent surgery for Lung CA  PRECAUTIONS: NWB until 2/14  SUBJECTIVE: I am going sideways up the stairs. Walked about 4k steps yesterday.   PAIN:  Are you having pain? NO NPRS scale: 0/10; last night about 6/10 Pain location: Lt knee Pain description: aching  Aggravating factors: movement Relieving factors: ice, rest    PATIENT SURVEYS:  FOTO primary measure 49   COGNITION:          Overall cognitive status: Within functional limits for tasks assessed                        SENSATION:          WFL       PALPATION: Warmth & edema as expected following surgery No s/s of infection at incision sites   LE AROM/PROM:   A/PROM Left 12/12/2021 Left  Left 01/02/22 Left 3/10  Hip flexion        Hip extension        Hip abduction        Hip adduction        Hip internal rotation        Hip external rotation        Knee flexion  54 60 active, 90 passive 117 with strap pull 115 active, 120 passive  Knee extension -10  -5  0  Ankle dorsiflexion        Ankle plantarflexion        Ankle inversion        Ankle eversion         (Blank rows = not tested)   LE MMT:   MMT Right 12/12/2021 Left 12/12/2021  Hip flexion      Hip extension      Hip abduction      Hip adduction      Hip internal rotation      Hip external rotation      Knee flexion      Knee extension      Ankle dorsiflexion      Ankle plantarflexion      Ankle inversion      Ankle eversion       (Blank rows = not tested)     GAIT: NWB with bilateral axillary crutches       TODAY'S TREATMENT: 3/9: Nu step 5 min L4 UE & LE MANUAL: patellar mobs, quad STM, mod thomas stretch with OP; Edema mob in Lt calf Stair  training  2/24: MANUAL: edema mobilization from ankle to knee, passive knee flexion stretching in prone, STM to VL with knee flexed AAROM knee flexion with strap Retro stepping, walking without AD Quad set in long sitting  VASO- medium 15 min 34 deg   2/17 3x10 quad set  SLR small range 4x8  SL SLR 2x10  Reviewed self extension stretch for home.   Reviewed HEP at home. Patient reports she was trying to get down on the floor for ankle pumps. She was advised she can do them any time from any position.   Standing weight shift fwd and lateral x10 with 50% weight bearing. Patient hesitant t first but had no major pain  Gait: reviewed set up with walker. Reviewed 50% weight bearing. Patient felt much more comfortable with walker. Difficulty with crutches 2nd to cancer surgery.  Manual: PROM flexion, patellar mobilizations, edema mobs on post knee;   Trigger poin release to the quad        PATIENT EDUCATION:  Education details: Anatomy of condition, POC, HEP, exercise form/rationale   Person educated: Patient and Spouse Education method: Explanation, Demonstration, Tactile cues, and Verbal cues Education comprehension: verbalized understanding, returned demonstration, verbal cues required, tactile cues required, and needs further education     HOME EXERCISE PROGRAM: [id number] - emailed at eval   ASSESSMENT:   CLINICAL IMPRESSION: Very minimal antalgic gait noted. Edema cont in lower leg but is improved and softens with exercise and manual PT. Began stairs to encourage glut activation which she was able to feel and note less pain.       Objective impairments include decreased activity tolerance, decreased mobility, difficulty walking, decreased ROM, decreased strength, increased edema, impaired flexibility, and pain. These impairments are limiting patient from cleaning, community activity, driving, meal prep, laundry, yard work, and shopping. Personal factors including Fitness and  1 comorbidity: CA  are also affecting patient's functional outcome. Patient will benefit from skilled PT to address above impairments and improve overall function.   REHAB POTENTIAL: Good   CLINICAL DECISION MAKING: Stable/uncomplicated   EVALUATION COMPLEXITY: Low     GOALS: Goals reviewed with patient? Yes   SHORT TERM GOALS:   STG Name Target Date Goal status  1 Knee flexion to 90 deg without incr pain Baseline: passes 90 deg but experienced pain 01/02/2022 Partially met  2 Demo good proximal hip activation & proper form in HEP Baseline: will cont to progress and educate as appropriae 01/02/2022 achieved                                                 LONG TERM GOALS:    LTG Name Target Date Goal status  1 Able to ambulate during daily activities pain <=2/10 Baseline: unable at eval 02/06/2022 INITIAL  2 Pt will navigate stairs at home step over step Baseline: 02/06/2022 INITIAL  3 Knee extension strength to 90% of non-surgical leg on dynamometry Baseline:not appropriate to test at eval 02/06/2022 INITIAL  4 Able to navigate soft/unstable surfaces such as the yard outside with good control and without limitation by knee pain Baseline: unable at eval 02/06/2022 INITIAL                               PLAN: PT FREQUENCY: 1-2x/week   PT DURATION: 8 weeks   PLANNED INTERVENTIONS: Therapeutic exercises, Therapeutic activity, Neuro Muscular re-education, Balance training, Gait training, Patient/Family education, Joint mobilization, Stair training, Dry Needling, Cryotherapy, Moist heat, scar mobilization, Taping, Vasopneumatic device, and Manual therapy  PLAN FOR NEXT SESSION: review stairs, wall squats, balance   Chinita Schimpf C. Lucely Leard PT, DPT 01/16/22 9:29 AM

## 2022-01-16 ENCOUNTER — Other Ambulatory Visit: Payer: Self-pay

## 2022-01-16 ENCOUNTER — Encounter (HOSPITAL_BASED_OUTPATIENT_CLINIC_OR_DEPARTMENT_OTHER): Payer: Self-pay | Admitting: Physical Therapy

## 2022-01-16 ENCOUNTER — Encounter (HOSPITAL_BASED_OUTPATIENT_CLINIC_OR_DEPARTMENT_OTHER): Payer: 59 | Attending: Orthopaedic Surgery | Admitting: Physical Therapy

## 2022-01-16 DIAGNOSIS — M6281 Muscle weakness (generalized): Secondary | ICD-10-CM | POA: Diagnosis not present

## 2022-01-16 DIAGNOSIS — R262 Difficulty in walking, not elsewhere classified: Secondary | ICD-10-CM | POA: Diagnosis not present

## 2022-01-16 DIAGNOSIS — M25562 Pain in left knee: Secondary | ICD-10-CM | POA: Diagnosis not present

## 2022-01-16 DIAGNOSIS — M25662 Stiffness of left knee, not elsewhere classified: Secondary | ICD-10-CM | POA: Diagnosis not present

## 2022-01-23 ENCOUNTER — Other Ambulatory Visit: Payer: Self-pay

## 2022-01-23 ENCOUNTER — Ambulatory Visit (HOSPITAL_BASED_OUTPATIENT_CLINIC_OR_DEPARTMENT_OTHER): Payer: 59 | Attending: Orthopaedic Surgery | Admitting: Physical Therapy

## 2022-01-23 ENCOUNTER — Ambulatory Visit (INDEPENDENT_AMBULATORY_CARE_PROVIDER_SITE_OTHER): Payer: 59 | Admitting: Orthopaedic Surgery

## 2022-01-23 ENCOUNTER — Encounter (HOSPITAL_BASED_OUTPATIENT_CLINIC_OR_DEPARTMENT_OTHER): Payer: Self-pay | Admitting: Physical Therapy

## 2022-01-23 DIAGNOSIS — M25562 Pain in left knee: Secondary | ICD-10-CM | POA: Diagnosis not present

## 2022-01-23 DIAGNOSIS — S83242A Other tear of medial meniscus, current injury, left knee, initial encounter: Secondary | ICD-10-CM

## 2022-01-23 DIAGNOSIS — R262 Difficulty in walking, not elsewhere classified: Secondary | ICD-10-CM | POA: Insufficient documentation

## 2022-01-23 DIAGNOSIS — S83241A Other tear of medial meniscus, current injury, right knee, initial encounter: Secondary | ICD-10-CM

## 2022-01-23 DIAGNOSIS — M25662 Stiffness of left knee, not elsewhere classified: Secondary | ICD-10-CM | POA: Insufficient documentation

## 2022-01-23 DIAGNOSIS — M6281 Muscle weakness (generalized): Secondary | ICD-10-CM | POA: Diagnosis not present

## 2022-01-23 NOTE — Progress Notes (Signed)
? ?                            ? ? ?Post Operative Evaluation ?  ? ?Procedure/Date of Surgery: left knee medial root 1/31 ? ?Interval History:  ? ?Today 6 weeks status post the above procedure.  Overall she is doing very well.  She is progressed to strengthening at this point.  She is walking normally without any type of assistive devices.  She has been working back on getting her quad strength.  She did have some pain with the stationary bike as result has dialed this back. ?PMH/PSH/Family History/Social History/Meds/Allergies:   ? ?Past Medical History:  ?Diagnosis Date  ? Dyspnea   ? Dysrhythmia   ? Endometrial polyp   ? Family history of adverse reaction to anesthesia   ? Patient states her mother had an adverse reaction to anesthesia but she is not sure exactly what it was  ? Fibromyalgia   ? GAD (generalized anxiety disorder)   ? History of panic attacks   ? Hypertension   ? followed by pcp  ? Medical history non-contributory   ? Neuropathy   ? OSA on CPAP   ? followed by dr dohmeier--- per study 02-02-2018 moderate to severe osa  ? Right bundle branch block   ? Sjogren's disease ([place])   ? Systemic lupus ([employer])   ? rheumonotologist-- dr aTrudie Reed  ? ?Past Surgical History:  ?Procedure Laterality Date  ? [place];  1989  ? CHONDROPLASTY Left 12/09/2021  ? Procedure: LEFT KNEE MEDIAL ROOT REPAIR, FEMORAL CHONDROPLASTY;  Surgeon: Vanetta Mulders, MD;  Location: [place];  Service: Orthopedics;  Laterality: Left;  general with regional block  ? COLONOSCOPY WITH PROPOFOL  10/2012  ? DILATATION & CURETTAGE/HYSTEROSCOPY WITH MYOSURE N/A 11/05/2020  ? Procedure: [place];  Surgeon: Princess Bruins, MD;  Location: [facility];  Service: Gynecology;  Laterality: N/A;  request to follow in [place] block time ~10:00am ?requests one hour  ? FIDUCIAL MARKER PLACEMENT  09/11/2021  ? Procedure: FIDUCIAL DYE MARKING;  Surgeon: Garner Nash, DO;  Location: [place] [facility];  Service: Pulmonary;;  ? INTERCOSTAL NERVE BLOCK Right 03/26/2021  ? Procedure: INTERCOSTAL NERVE BLOCK;  Surgeon: Lajuana Matte, MD;  Location: [place];  Service: Thoracic;  Laterality: Right;  ? INTERCOSTAL NERVE BLOCK Right 09/11/2021  ? Procedure: INTERCOSTAL NERVE BLOCK;  Surgeon: Lajuana Matte, MD;  Location: [place];  Service: Thoracic;  Laterality: Right;  ? LYMPH NODE DISSECTION  03/26/2021  ? Procedure: LYMPH NODE DISSECTION;  Surgeon: Lajuana Matte, MD;  Location: [place];  Service: Thoracic;;  ? LYMPH NODE DISSECTION Right 09/11/2021  ? Procedure: LYMPH NODE DISSECTION;  Surgeon: Lajuana Matte, MD;  Location: [place];  Service: Thoracic;  Laterality: Right;  ? thorascopy Right   ? Robotic Assisted  ? VIDEO BRONCHOSCOPY WITH ENDOBRONCHIAL NAVIGATION N/A 09/11/2021  ? Procedure: VIDEO BRONCHOSCOPY WITH ENDOBRONCHIAL NAVIGATION;  Surgeon: Garner Nash, DO;  Location: [place];  Service: Pulmonary;  Laterality: N/A;  ? ?Social History  ? ?Socioeconomic History  ? Marital status: Married  ?  Spouse name: Not on file  ? Number of children: 2  ? Years of education: post-grad  ? Highest education level: Not on file  ?Occupational History  ? Not on file  ?Tobacco Use  ? Smoking status: Former  ?  Years:  10.00  ?  Types: Cigarettes  ?  Quit date: 1989  ?  Years since quitting: 34.2  ? Smokeless tobacco: Never  ?Vaping Use  ? Vaping Use: Never used  ?Substance and Sexual Activity  ? Alcohol use: Yes  ?  Comment: occasional- social  ? Drug use: Never  ? Sexual activity: Yes  ?  Partners: Male  ?  Birth control/protection: Post-menopausal  ?  Comment: 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17, PARTNERS- 3, MARRIED- 58 YRS   ?Other Topics Concern  ? Not on file  ?Social History Narrative  ? Lives at home with her husband  ? Right handed  ? 2 cups of caffeine daily  ? ?Social Determinants of Health  ? ?Financial Resource Strain: Not on file  ?Food Insecurity: Not on file   ?Transportation Needs: Not on file  ?Physical Activity: Not on file  ?Stress: Not on file  ?Social Connections: Not on file  ? ?Family History  ?Problem Relation Age of Onset  ? Hypertension Mother   ? Osteoarthritis Mother   ? Cancer Father   ?     PROSTATE  ? Hypertension Father   ? Heart failure Father   ? Lung disease Father   ? Breast cancer Sister   ? Hypertension Sister   ? Lupus Sister   ? Sarcoidosis Paternal Aunt   ? Cancer Brother   ?     PROSTATE  ? Lupus Brother   ? Hypertension Brother   ? Hypertension Brother   ? Hypertension Brother   ? ?Allergies  ?Allergen Reactions  ? Penicillins Anaphylaxis  ? ?Current Outpatient Medications  ?Medication Sig Dispense Refill  ? acetaminophen (TYLENOL) 500 MG tablet Take 1,000 mg by mouth every 6 (six) hours as needed for moderate pain or headache.    ? ALPRAZolam (XANAX) 0.25 MG tablet Take 0.25 mg by mouth 2 (two) times daily as needed for anxiety.    ? aspirin EC 325 MG tablet Take 1 tablet (325 mg total) by mouth daily. 30 tablet 0  ? Cyanocobalamin (B-12) 3000 MCG CAPS Take 3,000 mg by mouth daily.    ? DULoxetine (CYMBALTA) 30 MG capsule Take 1 capsule (30 mg total) by mouth 2 (two) times daily. 180 capsule 3  ? fluticasone (FLOVENT HFA) 110 MCG/ACT inhaler Inhale 2 puffs into the lungs daily. (Patient not taking: Reported on 09/19/2021) 12 g 3  ? HYDROcodone-acetaminophen (NORCO/VICODIN) 5-325 MG tablet Take 1 tablet by mouth every 4 (four) hours as needed for moderate pain. (Patient not taking: Reported on 10/24/2021) 30 tablet 0  ? linaclotide (LINZESS) 145 MCG CAPS capsule Take 1 capsule (145 mcg total) by mouth daily before breakfast. 30 capsule 2  ? losartan-hydrochlorothiazide (HYZAAR) 50-12.5 MG tablet Take 1 tablet by mouth daily. 90 tablet 3  ? MELATONIN PO Take 1 capsule by mouth at bedtime as needed (sleep).    ? METAMUCIL FIBER PO Take 1 Scoop by mouth daily as needed (constipation).    ? OVER THE COUNTER MEDICATION Take 2 tablets by mouth  daily. [place] digestive enzyme blend    ? oxyCODONE (OXY IR/ROXICODONE) 5 MG immediate release tablet Take 1 tablet (5 mg total) by mouth every 4 (four) hours as needed (severe pain). 20 tablet 0  ? pregabalin (LYRICA) 25 MG capsule Take 1 capsule (25 mg total) by mouth 2 (two) times daily. (Patient not taking: Reported on 10/24/2021) 60 capsule 0  ? Respiratory Therapy Supplies (CARETOUCH 2 CPAP HOSE HANGER) MISC by Does not apply  route.    ? rosuvastatin (CRESTOR) 10 MG tablet Take 1 tablet (10 mg total) by mouth daily. 90 tablet 3  ? ?No current facility-administered medications for this visit.  ? ?No results found. ? ?Review of Systems:   ?A ROS was performed including pertinent positives and negatives as documented in the HPI. ? ? ?Musculoskeletal Exam:   ? ?There were no vitals taken for this visit. ? ?Incisions well-healed.  Range of motion is from 0 to 120 degrees without pain.  No joint line tenderness.  Sensation intact all distributions. ? ?Imaging:   ? ?None ? ?I personally reviewed and interpreted the radiographs. ? ? ?Assessment:   ?61 year old female 6 weeks status post meniscal root repair, overall doing extremely well.  At this time I would like her to advance her strengthening.  We did discuss that this is a very important part of the rehab that we will likely allow her to return to a full her level of activity.  She will continue to advance per protocol. ? ?Plan :   ? ?-Return to clinic in 6 weeks ? ? ? ?I personally saw and evaluated the patient, and participated in the management and treatment plan. ? ?Vanetta Mulders, MD ?Attending Physician, Orthopedic Surgery ? ?This document was dictated using Systems analyst. A reasonable attempt at proof reading has been made to minimize errors. ?

## 2022-01-23 NOTE — Therapy (Signed)
?OUTPATIENT PHYSICAL THERAPY TREATMENT NOTE ? ? ?Patient Name: Patricia Mathews ?MRN: [id number] ?DOB:1961-01-30, 61 y.o., female ?Today's Date: 01/23/2022 ? ?PCP: Horald Pollen, MD ?REFERRING PROVIDER:Steven Sammuel Hines, MD ? ? PT End of Session - 01/23/22 3875   ? ? Visit Number 6   ? Number of Visits 17   ? Date for PT Re-Evaluation 02/06/22   ? Authorization Type [facility] UMR   ? PT Start Time [phone]   ? PT Stop Time 2951   ? PT Time Calculation (min) 41 min   ? Activity Tolerance Patient tolerated treatment well   ? Behavior During Therapy [facility] for tasks assessed/performed   ? ?  ?  ? ?  ? ? ? ? ? ? ?Past Medical History:  ?Diagnosis Date  ? Dyspnea   ? Dysrhythmia   ? Endometrial polyp   ? Family history of adverse reaction to anesthesia   ? Patient states her mother had an adverse reaction to anesthesia but she is not sure exactly what it was  ? Fibromyalgia   ? GAD (generalized anxiety disorder)   ? History of panic attacks   ? Hypertension   ? followed by pcp  ? Medical history non-contributory   ? Neuropathy   ? OSA on CPAP   ? followed by dr dohmeier--- per study 02-02-2018 moderate to severe osa  ? Right bundle branch block   ? Sjogren's disease ([place])   ? Systemic lupus ([place])   ? rheumonotologist-- dr aTrudie Reed  ? ?Past Surgical History:  ?Procedure Laterality Date  ? [place];  1989  ? CHONDROPLASTY Left 12/09/2021  ? Procedure: LEFT KNEE MEDIAL ROOT REPAIR, FEMORAL CHONDROPLASTY;  Surgeon: Vanetta Mulders, MD;  Location: [place];  Service: Orthopedics;  Laterality: Left;  general with regional block  ? COLONOSCOPY WITH PROPOFOL  10/2012  ? DILATATION & CURETTAGE/HYSTEROSCOPY WITH MYOSURE N/A 11/05/2020  ? Procedure: [place];  Surgeon: Princess Bruins, MD;  Location: [facility];  Service: Gynecology;  Laterality: N/A;  request to follow in [place] block time ~10:00am ?requests one hour  ? FIDUCIAL MARKER  PLACEMENT  09/11/2021  ? Procedure: FIDUCIAL DYE MARKING;  Surgeon: Garner Nash, DO;  Location: [place] [facility];  Service: Pulmonary;;  ? INTERCOSTAL NERVE BLOCK Right 03/26/2021  ? Procedure: INTERCOSTAL NERVE BLOCK;  Surgeon: Lajuana Matte, MD;  Location: [place];  Service: Thoracic;  Laterality: Right;  ? INTERCOSTAL NERVE BLOCK Right 09/11/2021  ? Procedure: INTERCOSTAL NERVE BLOCK;  Surgeon: Lajuana Matte, MD;  Location: [place];  Service: Thoracic;  Laterality: Right;  ? LYMPH NODE DISSECTION  03/26/2021  ? Procedure: LYMPH NODE DISSECTION;  Surgeon: Lajuana Matte, MD;  Location: [place];  Service: Thoracic;;  ? LYMPH NODE DISSECTION Right 09/11/2021  ? Procedure: LYMPH NODE DISSECTION;  Surgeon: Lajuana Matte, MD;  Location: [place];  Service: Thoracic;  Laterality: Right;  ? thorascopy Right   ? Robotic Assisted  ? VIDEO BRONCHOSCOPY WITH ENDOBRONCHIAL NAVIGATION N/A 09/11/2021  ? Procedure: VIDEO BRONCHOSCOPY WITH ENDOBRONCHIAL NAVIGATION;  Surgeon: Garner Nash, DO;  Location: [place];  Service: Pulmonary;  Laterality: N/A;  ? ?Patient Active Problem List  ? Diagnosis Date Noted  ? Acute medial meniscus tear of right knee   ? Plica of knee, left   ? Dyslipidemia 10/24/2021  ? Hypertension 10/24/2021  ? Neuropathic pain 10/24/2021  ? S/P partial lobectomy of lung 09/11/2021  ? Carcinoid  tumor   ? Malignant carcinoid tumor of the bronchus and lung ([place]) 04/18/2021  ? Drug induced constipation 04/18/2021  ? Right lower lobe pulmonary nodule 03/26/2021  ? Essential hypertension, benign 12/25/2019  ? Other forms of systemic lupus erythematosus ([place]) 12/25/2019  ? Sjogren's syndrome ([place]) 12/25/2019  ? GAD (generalized anxiety disorder) 12/25/2019  ? Obstructive sleep apnea treated with continuous positive airway pressure (CPAP) 09/01/2018  ? Sleep related headaches 01/03/2018  ? Snoring 01/03/2018  ? Hypersomnia with sleep apnea 01/03/2018  ? Arthralgia 01/03/2018  ? Multiple  neurological symptoms 12/28/2017  ? ? ?REFERRING DIAG: s/p Lt knee femoral chondroplasty ? ?THERAPY DIAG:  ?Acute pain of left knee ? ?Stiffness of left knee, not elsewhere classified ? ?Muscle weakness (generalized) ? ?Difficulty in walking, not elsewhere classified ? ?PERTINENT HISTORY: Recent surgery for Lung CA ? ?PRECAUTIONS: NWB until 2/14 ? ?SUBJECTIVE: stairs were painful and peloton hurt some. Md feels like I am abot 80%. I did about 4k steps yesterday and was exhausted. I feel like I am close to 100%. ? ?PAIN:  ?Are you having pain? NO pain, just sore ?NPRS scale: 0/10; last night about 6/10 ?Pain location: Lt knee ?Pain description: aching  ?Aggravating factors: movement ?Relieving factors: ice, rest ? ? ? ?PATIENT SURVEYS:  ?FOTO primary measure 11 ?  ?COGNITION: ?         Overall cognitive status: Within functional limits for tasks assessed              ?          ?SENSATION: ?         WFL ?  ?  ?  ?PALPATION: ?Warmth & edema as expected following surgery ?No s/s of infection at incision sites ?  ?LE AROM/PROM: ?  ?A/PROM Left ?12/12/2021 Left  Left ?01/02/22 Left ?3/10 Left  ?3/17  ?Hip flexion         ?Hip extension         ?Hip abduction         ?Hip adduction         ?Hip internal rotation         ?Hip external rotation         ?Knee flexion  54 60 active, 90 passive 117 with strap pull 115 active, 120 passive 110 active ?117 passive ?Measured before warmup  ?Knee extension -10  -5  0   ?Ankle dorsiflexion         ?Ankle plantarflexion         ?Ankle inversion         ?Ankle eversion         ? (Blank rows = not tested) ?  ?LE MMT: ?  ?MMT Right ?3/17 Left ?3/17  ?Hip flexion  7.7 lb 6.6   ?Hip extension      ?Hip abduction  16.2  12.3  ?Hip adduction      ?Hip internal rotation      ?Hip external rotation      ?Knee flexion      ?Knee extension 25.7   36  ?Ankle dorsiflexion      ?Ankle plantarflexion      ?Ankle inversion      ?Ankle eversion      ? (Blank rows = not tested) ?  ?  ?GAIT: ?NWB with  bilateral axillary crutches ?  ?  ?  ?TODAY'S TREATMENT: ?3/17: ?Discussed progression of endurance ?Nu step 5 min L5 ?Stairs- pattern/form ?Gastroc stretch on  step ?Sidelying 90 heel/toe taps ?Sidelying small and large circles ?Seated HSS ? ?3/9: ?Nu step 5 min L4 UE & LE ?MANUAL: patellar mobs, quad STM, mod thomas stretch with OP; Edema mob in Lt calf ?Stair training ? ?2/24: ?MANUAL: edema mobilization from ankle to knee, passive knee flexion stretching in prone, STM to VL with knee flexed ?AAROM knee flexion with strap ?Retro stepping, walking without AD ?Quad set in long sitting ?VASO- medium 15 min 34 deg ? ?  ?  ?PATIENT EDUCATION:  ?Education details: Anatomy of condition, POC, HEP, exercise form/rationale ?  ?Person educated: Patient and Spouse ?Education method: Explanation, Demonstration, Tactile cues, and Verbal cues ?Education comprehension: verbalized understanding, returned demonstration, verbal cues required, tactile cues required, and needs further education ?  ?  ?HOME EXERCISE PROGRAM: ?[email or web address] - emailed at eval ?  ?ASSESSMENT: ?  ?CLINICAL IMPRESSION: ?Maintaining ROM from session to session. Continued with stair training for proper form, able to do stairs with discomfort as expected due to pressure but pain levels ok. Progressed HEP for hip abd strength.  ? ? ? ?  ?Objective impairments include decreased activity tolerance, decreased mobility, difficulty walking, decreased ROM, decreased strength, increased edema, impaired flexibility, and pain. These impairments are limiting patient from cleaning, community activity, driving, meal prep, laundry, yard work, and shopping. Personal factors including Fitness and 1 comorbidity: CA  are also affecting patient's functional outcome. Patient will benefit from skilled PT to address above impairments and improve overall function. ?  ?REHAB POTENTIAL: Good ?  ?CLINICAL DECISION MAKING: Stable/uncomplicated ?  ?EVALUATION COMPLEXITY: Low ?  ?   ?GOALS: ?Goals reviewed with patient? Yes ?  ?SHORT TERM GOALS: ?  ?STG Name Target Date Goal status  ?1 Knee flexion to 90 deg without incr pain ?Baseline: passes 90 deg but experienced pain 01/02/2022 met  ?2 Demo good proximal

## 2022-01-30 ENCOUNTER — Ambulatory Visit (HOSPITAL_BASED_OUTPATIENT_CLINIC_OR_DEPARTMENT_OTHER): Payer: 59 | Admitting: Physical Therapy

## 2022-01-30 ENCOUNTER — Other Ambulatory Visit: Payer: Self-pay

## 2022-01-30 ENCOUNTER — Encounter (HOSPITAL_BASED_OUTPATIENT_CLINIC_OR_DEPARTMENT_OTHER): Payer: Self-pay | Admitting: Physical Therapy

## 2022-01-30 DIAGNOSIS — R262 Difficulty in walking, not elsewhere classified: Secondary | ICD-10-CM | POA: Diagnosis not present

## 2022-01-30 DIAGNOSIS — M25662 Stiffness of left knee, not elsewhere classified: Secondary | ICD-10-CM

## 2022-01-30 DIAGNOSIS — M6281 Muscle weakness (generalized): Secondary | ICD-10-CM

## 2022-01-30 DIAGNOSIS — M25562 Pain in left knee: Secondary | ICD-10-CM

## 2022-01-30 NOTE — Therapy (Signed)
?OUTPATIENT PHYSICAL THERAPY TREATMENT NOTE ? ? ?Patient Name: Patricia Mathews ?MRN: [id number] ?DOB:03-08-1961, 61 y.o., female ?Today's Date: 01/30/2022 ? ?PCP: Horald Pollen, MD ?REFERRING PROVIDER:Steven Sammuel Hines, MD ? ? PT End of Session - 01/30/22 0849   ? ? Visit Number 7   ? Number of Visits 17   ? Date for PT Re-Evaluation 02/06/22   ? Authorization Type [facility] UMR   ? PT Start Time [phone]   ? PT Stop Time 0925   ? PT Time Calculation (min) 38 min   ? Activity Tolerance Patient tolerated treatment well   ? Behavior During Therapy [facility] for tasks assessed/performed   ? ?  ?  ? ?  ? ? ? ? ? ? ? ?Past Medical History:  ?Diagnosis Date  ? Dyspnea   ? Dysrhythmia   ? Endometrial polyp   ? Family history of adverse reaction to anesthesia   ? Patient states her mother had an adverse reaction to anesthesia but she is not sure exactly what it was  ? Fibromyalgia   ? GAD (generalized anxiety disorder)   ? History of panic attacks   ? Hypertension   ? followed by pcp  ? Medical history non-contributory   ? Neuropathy   ? OSA on CPAP   ? followed by dr dohmeier--- per study 02-02-2018 moderate to severe osa  ? Right bundle branch block   ? Sjogren's disease ([place])   ? Systemic lupus ([place])   ? rheumonotologist-- dr aTrudie Reed  ? ?Past Surgical History:  ?Procedure Laterality Date  ? [place];  1989  ? CHONDROPLASTY Left 12/09/2021  ? Procedure: LEFT KNEE MEDIAL ROOT REPAIR, FEMORAL CHONDROPLASTY;  Surgeon: Vanetta Mulders, MD;  Location: [place];  Service: Orthopedics;  Laterality: Left;  general with regional block  ? COLONOSCOPY WITH PROPOFOL  10/2012  ? DILATATION & CURETTAGE/HYSTEROSCOPY WITH MYOSURE N/A 11/05/2020  ? Procedure: [place];  Surgeon: Princess Bruins, MD;  Location: [facility];  Service: Gynecology;  Laterality: N/A;  request to follow in [place] block time ~10:00am ?requests one hour  ? FIDUCIAL MARKER  PLACEMENT  09/11/2021  ? Procedure: FIDUCIAL DYE MARKING;  Surgeon: Garner Nash, DO;  Location: [place] [facility];  Service: Pulmonary;;  ? INTERCOSTAL NERVE BLOCK Right 03/26/2021  ? Procedure: INTERCOSTAL NERVE BLOCK;  Surgeon: Lajuana Matte, MD;  Location: [place];  Service: Thoracic;  Laterality: Right;  ? INTERCOSTAL NERVE BLOCK Right 09/11/2021  ? Procedure: INTERCOSTAL NERVE BLOCK;  Surgeon: Lajuana Matte, MD;  Location: [place];  Service: Thoracic;  Laterality: Right;  ? LYMPH NODE DISSECTION  03/26/2021  ? Procedure: LYMPH NODE DISSECTION;  Surgeon: Lajuana Matte, MD;  Location: [place];  Service: Thoracic;;  ? LYMPH NODE DISSECTION Right 09/11/2021  ? Procedure: LYMPH NODE DISSECTION;  Surgeon: Lajuana Matte, MD;  Location: [place];  Service: Thoracic;  Laterality: Right;  ? thorascopy Right   ? Robotic Assisted  ? VIDEO BRONCHOSCOPY WITH ENDOBRONCHIAL NAVIGATION N/A 09/11/2021  ? Procedure: VIDEO BRONCHOSCOPY WITH ENDOBRONCHIAL NAVIGATION;  Surgeon: Garner Nash, DO;  Location: [place];  Service: Pulmonary;  Laterality: N/A;  ? ?Patient Active Problem List  ? Diagnosis Date Noted  ? Acute medial meniscus tear of right knee   ? Plica of knee, left   ? Dyslipidemia 10/24/2021  ? Hypertension 10/24/2021  ? Neuropathic pain 10/24/2021  ? S/P partial lobectomy of lung 09/11/2021  ?  Carcinoid tumor   ? Malignant carcinoid tumor of the bronchus and lung ([place]) 04/18/2021  ? Drug induced constipation 04/18/2021  ? Right lower lobe pulmonary nodule 03/26/2021  ? Essential hypertension, benign 12/25/2019  ? Other forms of systemic lupus erythematosus ([place]) 12/25/2019  ? Sjogren's syndrome ([place]) 12/25/2019  ? GAD (generalized anxiety disorder) 12/25/2019  ? Obstructive sleep apnea treated with continuous positive airway pressure (CPAP) 09/01/2018  ? Sleep related headaches 01/03/2018  ? Snoring 01/03/2018  ? Hypersomnia with sleep apnea 01/03/2018  ? Arthralgia 01/03/2018  ? Multiple  neurological symptoms 12/28/2017  ? ? ?REFERRING DIAG: s/p Lt knee femoral chondroplasty ? ?THERAPY DIAG:  ?Acute pain of left knee ? ?Stiffness of left knee, not elsewhere classified ? ?Muscle weakness (generalized) ? ?Difficulty in walking, not elsewhere classified ? ?PERTINENT HISTORY: Recent surgery for Lung CA ? ?PRECAUTIONS: NWB until 2/14 ? ?SUBJECTIVE: My dad is in end of life and my leg hurts because it is emotional. I can do all 3 flights of stairs wihtout pain, just slowly.  ? ?PAIN:  ?Are you having pain? yes ?NPRS scale: 3-4/10  ?Pain location: Lt knee ?Pain description: aching  ?Aggravating factors: movement ?Relieving factors: ice, rest ? ? ? ?PATIENT SURVEYS:  ?FOTO  ?EVAL: 49 ?3/24: 69 ?  ?COGNITION: ?         Overall cognitive status: Within functional limits for tasks assessed              ?          ?SENSATION: ?         WFL ?  ?  ?  ?PALPATION: ?Unremarkable, Baker's cyst resolved ?  ?LE AROM/PROM: denies feeling limited by current ROM ?  ?A/PROM Left ?12/12/2021 Left  Left ?01/02/22 Left ?3/10 Left  ?3/17  ?Hip flexion         ?Hip extension         ?Hip abduction         ?Hip adduction         ?Hip internal rotation         ?Hip external rotation         ?Knee flexion  54 60 active, 90 passive 117 with strap pull 115 active, 120 passive 110 active ?117 passive ?Measured before warmup  ?Knee extension -10  -5  0   ?Ankle dorsiflexion         ?Ankle plantarflexion         ?Ankle inversion         ?Ankle eversion         ? (Blank rows = not tested) ?  ?LE MMT: ?  ?MMT Right ?3/17 Left ?3/17 Rt/Lt ?3/24  ?Hip flexion  7.7 lb 6.6    ?Hip extension       ?Hip abduction  16.2  12.3   ?Hip adduction       ?Hip internal rotation       ?Hip external rotation       ?Knee flexion       ?Knee extension 25.7   36 38.1/ 36.1  ?Ankle dorsiflexion       ?Ankle plantarflexion       ?Ankle inversion       ?Ankle eversion       ? (Blank rows = not tested) ?  ?  ?GAIT: ?WFL ?  ?  ?  ?TODAY'S TREATMENT: ?3/24: ?Nustep  5 min L6 UE & LE ?SLS on airex ?Lateral step tap  in SLS on airex ?Heel raises ? ?3/17: ?Discussed progression of endurance ?Nu step 5 min L5 ?Stairs- pattern/form ?Gastroc stretch on step ?Sidelying 90 heel/toe taps ?Sidelying small and large circles ?Seated HSS ? ?  ?  ?PATIENT EDUCATION:  ?Education details: progress, poc, hiking ?Person educated: Patient and Spouse ?Education method: Explanation, Demonstration, Tactile cues, and Verbal cues ?Education comprehension: verbalized understanding, returned demonstration, verbal cues required, tactile cues required, and needs further education ?  ?  ?HOME EXERCISE PROGRAM: ?[email or web address] - emailed at eval ?  ?ASSESSMENT: ?  ?CLINICAL IMPRESSION: ?Pt feels that she is back to, nearly, 100%. She is able to demonstrate proper form with stairs and goes slowly for safety. Strength is equal to opposite side indicating proper balance. At this time we will hold on PT for a couple of weeks as she will be traveling and will re-evaluate as needed moving forward.  ? ? ? ?  ?Objective impairments include decreased activity tolerance, decreased mobility, difficulty walking, decreased ROM, decreased strength, increased edema, impaired flexibility, and pain. These impairments are limiting patient from cleaning, community activity, driving, meal prep, laundry, yard work, and shopping. Personal factors including Fitness and 1 comorbidity: CA  are also affecting patient's functional outcome. Patient will benefit from skilled PT to address above impairments and improve overall function. ?  ?REHAB POTENTIAL: Good ?  ?CLINICAL DECISION MAKING: Stable/uncomplicated ?  ?EVALUATION COMPLEXITY: Low ?  ?  ?GOALS: ?Goals reviewed with patient? Yes ?  ?SHORT TERM GOALS: ?  ?STG Name Target Date Goal status  ?1 Knee flexion to 90 deg without incr pain ?Baseline: passes 90 deg but experienced pain 01/02/2022 met  ?2 Demo good proximal hip activation & proper form in HEP ?Baseline: will cont to progress and  educate as appropriae 01/02/2022 achieved  ?         ?         ?         ?         ?         ?  ?LONG TERM GOALS:  ?  ?LTG Name Target Date Goal status  ?1 Able to ambulate during daily activities pain <=2/10 ?Ba

## 2022-02-06 ENCOUNTER — Ambulatory Visit (HOSPITAL_BASED_OUTPATIENT_CLINIC_OR_DEPARTMENT_OTHER): Payer: 59 | Admitting: Physical Therapy

## 2022-02-13 ENCOUNTER — Encounter (HOSPITAL_BASED_OUTPATIENT_CLINIC_OR_DEPARTMENT_OTHER): Payer: 59 | Admitting: Physical Therapy

## 2022-02-20 ENCOUNTER — Ambulatory Visit (HOSPITAL_BASED_OUTPATIENT_CLINIC_OR_DEPARTMENT_OTHER): Payer: 59 | Admitting: Physical Therapy

## 2022-02-26 ENCOUNTER — Other Ambulatory Visit (HOSPITAL_COMMUNITY): Payer: Self-pay

## 2022-02-26 ENCOUNTER — Other Ambulatory Visit: Payer: Self-pay | Admitting: Emergency Medicine

## 2022-02-26 MED ORDER — DULOXETINE HCL 30 MG PO CPEP
30.0000 mg | ORAL_CAPSULE | Freq: Two times a day (BID) | ORAL | 3 refills | Status: DC
Start: 2022-02-26 — End: 2023-02-03
  Filled 2022-02-26: qty 180, 90d supply, fill #0
  Filled 2022-05-20: qty 180, 90d supply, fill #1
  Filled 2022-08-26: qty 180, 90d supply, fill #2
  Filled 2022-11-23: qty 180, 90d supply, fill #3

## 2022-02-27 ENCOUNTER — Other Ambulatory Visit (HOSPITAL_COMMUNITY): Payer: Self-pay

## 2022-02-27 ENCOUNTER — Ambulatory Visit (HOSPITAL_BASED_OUTPATIENT_CLINIC_OR_DEPARTMENT_OTHER): Payer: 59 | Admitting: Physical Therapy

## 2022-03-06 ENCOUNTER — Ambulatory Visit (INDEPENDENT_AMBULATORY_CARE_PROVIDER_SITE_OTHER): Payer: 59 | Admitting: Orthopaedic Surgery

## 2022-03-06 ENCOUNTER — Ambulatory Visit (HOSPITAL_BASED_OUTPATIENT_CLINIC_OR_DEPARTMENT_OTHER): Payer: 59 | Admitting: Physical Therapy

## 2022-03-06 DIAGNOSIS — S83241A Other tear of medial meniscus, current injury, right knee, initial encounter: Secondary | ICD-10-CM

## 2022-03-06 NOTE — Progress Notes (Signed)
? ?                            ? ? ?Post Operative Evaluation ?  ? ?Procedure/Date of Surgery: left knee medial root 1/31 ? ?Interval History:  ? ?Presents today status post left knee medial root repair done 3 months prior.  Overall she continues to feel very good.  She states that she feels like 99% improvement.  She is overall very happy.  She is still limited in certain activities that require significant strength like stairs as well as getting on her Peloton.  She is continue to work on a home exercising program. ? ? ?PMH/PSH/Family History/Social History/Meds/Allergies:   ? ?Past Medical History:  ?Diagnosis Date  ? Dyspnea   ? Dysrhythmia   ? Endometrial polyp   ? Family history of adverse reaction to anesthesia   ? Patient states her mother had an adverse reaction to anesthesia but she is not sure exactly what it was  ? Fibromyalgia   ? GAD (generalized anxiety disorder)   ? History of panic attacks   ? Hypertension   ? followed by pcp  ? Medical history non-contributory   ? Neuropathy   ? OSA on CPAP   ? followed by dr dohmeier--- per study 02-02-2018 moderate to severe osa  ? Right bundle branch block   ? Sjogren's disease ([place])   ? Systemic lupus ([place])   ? rheumonotologist-- dr aTrudie Reed  ? ?Past Surgical History:  ?Procedure Laterality Date  ? [place];  1989  ? CHONDROPLASTY Left 12/09/2021  ? Procedure: LEFT KNEE MEDIAL ROOT REPAIR, FEMORAL CHONDROPLASTY;  Surgeon: Vanetta Mulders, MD;  Location: [facility];  Service: Orthopedics;  Laterality: Left;  general with regional block  ? COLONOSCOPY WITH PROPOFOL  10/2012  ? DILATATION & CURETTAGE/HYSTEROSCOPY WITH MYOSURE N/A 11/05/2020  ? Procedure: [place];  Surgeon: Princess Bruins, MD;  Location: [facility];  Service: Gynecology;  Laterality: N/A;  request to follow in [place] block time ~10:00am ?requests one hour  ? FIDUCIAL MARKER PLACEMENT  09/11/2021  ?  Procedure: FIDUCIAL DYE MARKING;  Surgeon: Garner Nash, DO;  Location: [facility];  Service: Pulmonary;;  ? INTERCOSTAL NERVE BLOCK Right 03/26/2021  ? Procedure: INTERCOSTAL NERVE BLOCK;  Surgeon: Lajuana Matte, MD;  Location: [facility];  Service: Thoracic;  Laterality: Right;  ? INTERCOSTAL NERVE BLOCK Right 09/11/2021  ? Procedure: INTERCOSTAL NERVE BLOCK;  Surgeon: Lajuana Matte, MD;  Location: [facility];  Service: Thoracic;  Laterality: Right;  ? LYMPH NODE DISSECTION  03/26/2021  ? Procedure: LYMPH NODE DISSECTION;  Surgeon: Lajuana Matte, MD;  Location: [facility];  Service: Thoracic;;  ? LYMPH NODE DISSECTION Right 09/11/2021  ? Procedure: LYMPH NODE DISSECTION;  Surgeon: Lajuana Matte, MD;  Location: [facility];  Service: Thoracic;  Laterality: Right;  ? thorascopy Right   ? Robotic Assisted  ? VIDEO BRONCHOSCOPY WITH ENDOBRONCHIAL NAVIGATION N/A 09/11/2021  ? Procedure: VIDEO BRONCHOSCOPY WITH ENDOBRONCHIAL NAVIGATION;  Surgeon: Garner Nash, DO;  Location: [place];  Service: Pulmonary;  Laterality: N/A;  ? ?Social History  ? ?Socioeconomic History  ? Marital status: Married  ?  Spouse name: Not on file  ? Number of children: 2  ? Years of education: post-grad  ? Highest education level: Not on file  ?Occupational History  ? Not on file  ?Tobacco Use  ?  Smoking status: Former  ?  Years: 10.00  ?  Types: Cigarettes  ?  Quit date: 1989  ?  Years since quitting: 34.3  ? Smokeless tobacco: Never  ?Vaping Use  ? Vaping Use: Never used  ?Substance and Sexual Activity  ? Alcohol use: Yes  ?  Comment: occasional- social  ? Drug use: Never  ? Sexual activity: Yes  ?  Partners: Male  ?  Birth control/protection: Post-menopausal  ?  Comment: 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17, PARTNERS- 3, MARRIED- 14 YRS   ?Other Topics Concern  ? Not on file  ?Social History Narrative  ? Lives at home with her husband  ? Right handed  ? 2 cups of caffeine daily  ? ?Social Determinants of Health  ? ?Financial Resource  Strain: Not on file  ?Food Insecurity: Not on file  ?Transportation Needs: Not on file  ?Physical Activity: Not on file  ?Stress: Not on file  ?Social Connections: Not on file  ? ?Family History  ?Problem Relation Age of Onset  ? Hypertension Mother   ? Osteoarthritis Mother   ? Cancer Father   ?     PROSTATE  ? Hypertension Father   ? Heart failure Father   ? Lung disease Father   ? Breast cancer Sister   ? Hypertension Sister   ? Lupus Sister   ? Sarcoidosis Paternal Aunt   ? Cancer Brother   ?     PROSTATE  ? Lupus Brother   ? Hypertension Brother   ? Hypertension Brother   ? Hypertension Brother   ? ?Allergies  ?Allergen Reactions  ? Penicillins Anaphylaxis  ? ?Current Outpatient Medications  ?Medication Sig Dispense Refill  ? acetaminophen (TYLENOL) 500 MG tablet Take 1,000 mg by mouth every 6 (six) hours as needed for moderate pain or headache.    ? ALPRAZolam (XANAX) 0.25 MG tablet Take 0.25 mg by mouth 2 (two) times daily as needed for anxiety.    ? aspirin EC 325 MG tablet Take 1 tablet (325 mg total) by mouth daily. 30 tablet 0  ? Cyanocobalamin (B-12) 3000 MCG CAPS Take 3,000 mg by mouth daily.    ? DULoxetine (CYMBALTA) 30 MG capsule Take 1 capsule (30 mg total) by mouth 2 (two) times daily. 180 capsule 3  ? fluticasone (FLOVENT HFA) 110 MCG/ACT inhaler Inhale 2 puffs into the lungs daily. (Patient not taking: Reported on 09/19/2021) 12 g 3  ? HYDROcodone-acetaminophen (NORCO/VICODIN) 5-325 MG tablet Take 1 tablet by mouth every 4 (four) hours as needed for moderate pain. (Patient not taking: Reported on 10/24/2021) 30 tablet 0  ? linaclotide (LINZESS) 145 MCG CAPS capsule Take 1 capsule (145 mcg total) by mouth daily before breakfast. 30 capsule 2  ? losartan-hydrochlorothiazide (HYZAAR) 50-12.5 MG tablet Take 1 tablet by mouth daily. 90 tablet 3  ? MELATONIN PO Take 1 capsule by mouth at bedtime as needed (sleep).    ? METAMUCIL FIBER PO Take 1 Scoop by mouth daily as needed (constipation).    ? OVER  THE COUNTER MEDICATION Take 2 tablets by mouth daily. Olly digestive enzyme blend    ? oxyCODONE (OXY IR/ROXICODONE) 5 MG immediate release tablet Take 1 tablet (5 mg total) by mouth every 4 (four) hours as needed (severe pain). 20 tablet 0  ? Respiratory Therapy Supplies (CARETOUCH 2 CPAP HOSE HANGER) MISC by Does not apply route.    ? rosuvastatin (CRESTOR) 10 MG tablet Take 1 tablet (10 mg total) by mouth daily. 90 tablet  3  ? ?No current facility-administered medications for this visit.  ? ?No results found. ? ?Review of Systems:   ?A ROS was performed including pertinent positives and negatives as documented in the HPI. ? ? ?Musculoskeletal Exam:   ? ?There were no vitals taken for this visit. ? ?Incisions well-healed.  Range of motion is from 0 to 1235 degrees without pain.  No joint line tenderness.  Sensation intact all distributions. ? ?Imaging:   ? ?None ? ?I personally reviewed and interpreted the radiographs. ? ? ?Assessment:   ?61 year old female 12 weeks status post medial meniscal root repair of the left knee.  At this time I advised that she continue a strengthening program for both the hip and the quad.  Specific exercises were provided to her.  She will see me back for reassessment in 2 months. ?Plan :   ? ?-Return to clinic in 8 weeks ? ? ? ?I personally saw and evaluated the patient, and participated in the management and treatment plan. ? ?Vanetta Mulders, MD ?Attending Physician, Orthopedic Surgery ? ?This document was dictated using Systems analyst. A reasonable attempt at proof reading has been made to minimize errors. ?

## 2022-03-08 IMAGING — MR MR KNEE*L* W/O CM
4 of 6 series · 22 of 40 positions shown · non-contrast
Comparison: X-ray knee 08/08/2021.

CLINICAL DATA: Chronic left knee pain related to a fall injury.
Clinical concern for internal derangement/structural damage.

EXAM:
MRI OF THE LEFT KNEE WITHOUT CONTRAST
TECHNIQUE: Multiplanar, multisequence MR imaging of the knee was performed. No
intravenous contrast was administered.

[Series 4: T2 fat-sat · coronal · 4.0mm · 0.62mm/px · 6 of 29 slices shown (1 of 2)]
[im 1/29]
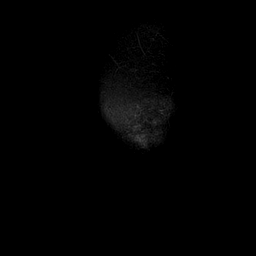
[im 6/29]
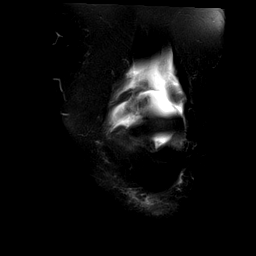
[im 12/29]
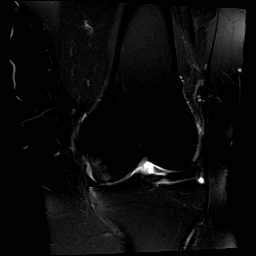
[im 17/29]
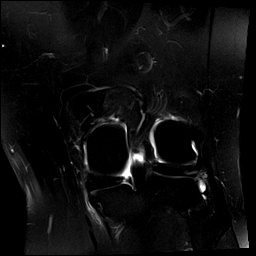
[im 23/29]
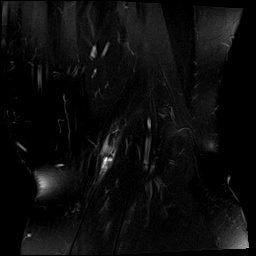
[im 29/29]
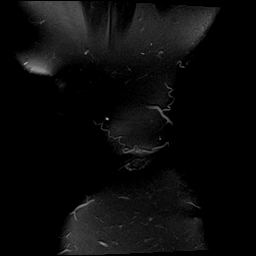

[Series 5: T1 · coronal · 4.0mm · 0.31mm/px · 3 of 29 slices shown]
[im 6/29]
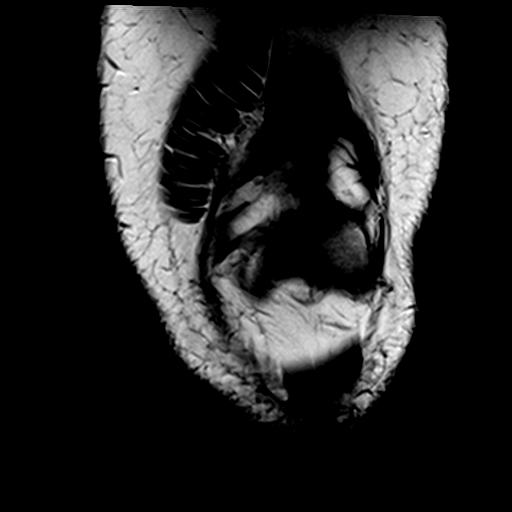
[im 17/29]
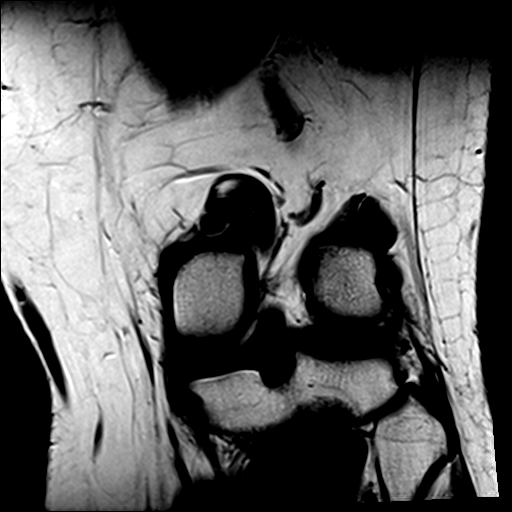
[im 29/29]
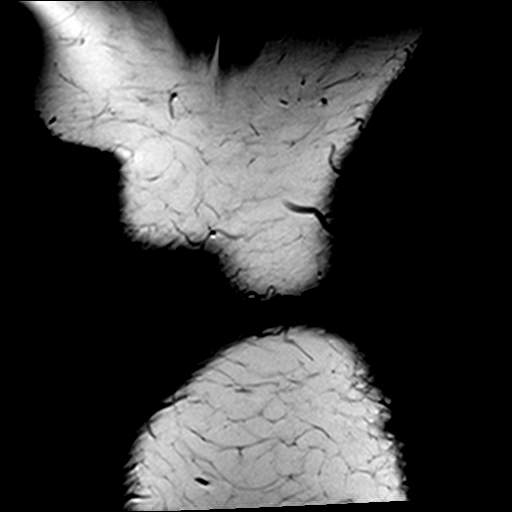

[Series 8: PD fat-sat · sagittal · 3.0mm · 0.31mm/px · 7 of 33 slices shown]
[im 1/33]
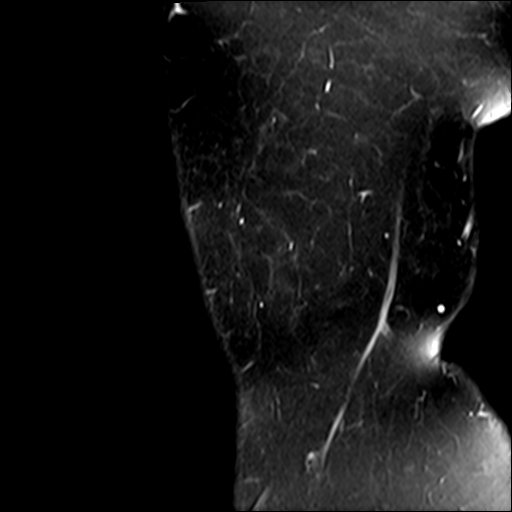
[im 6/33]
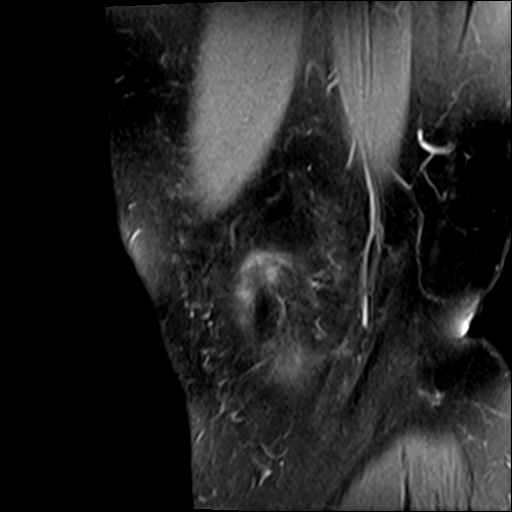
[im 11/33]
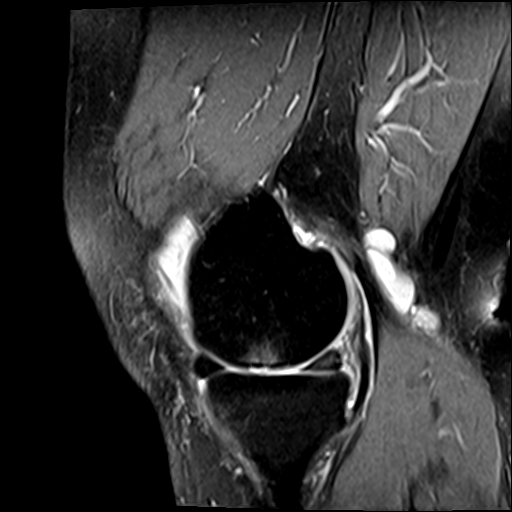
[im 17/33]
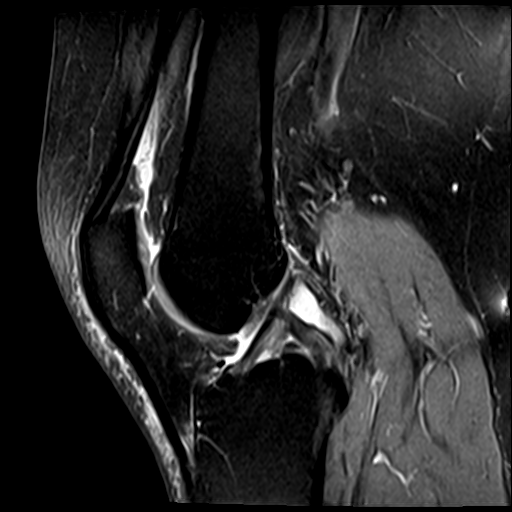
[im 22/33]
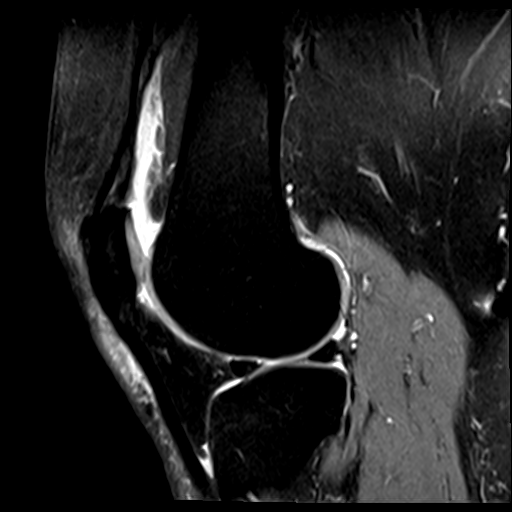
[im 27/33]
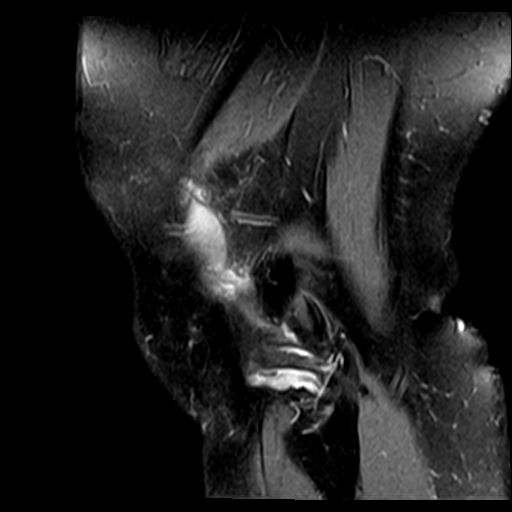
[im 33/33]
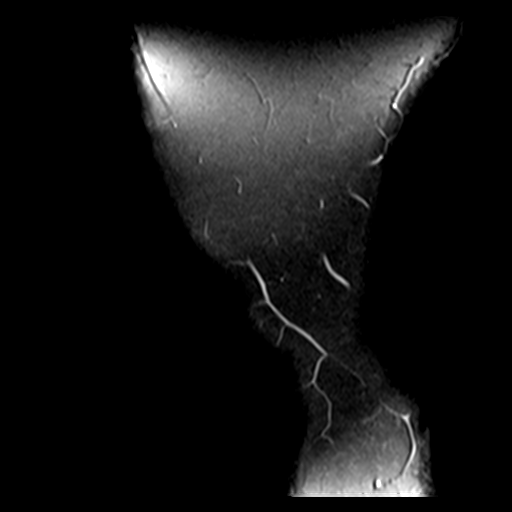

[Series 9: T2 fat-sat · sagittal · 3.0mm · 0.31mm/px · 6 of 33 slices shown (2 of 2)]
[im 1/33]
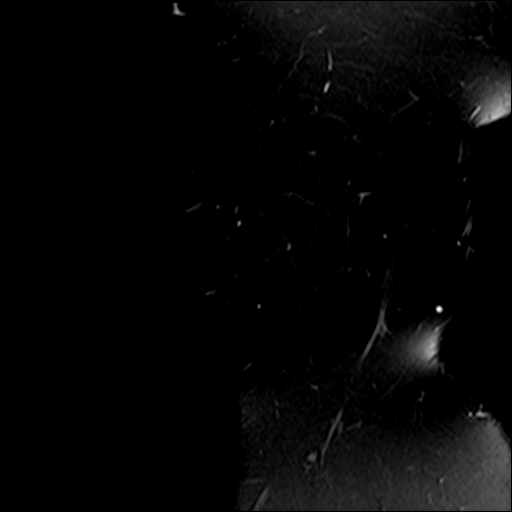
[im 6/33]
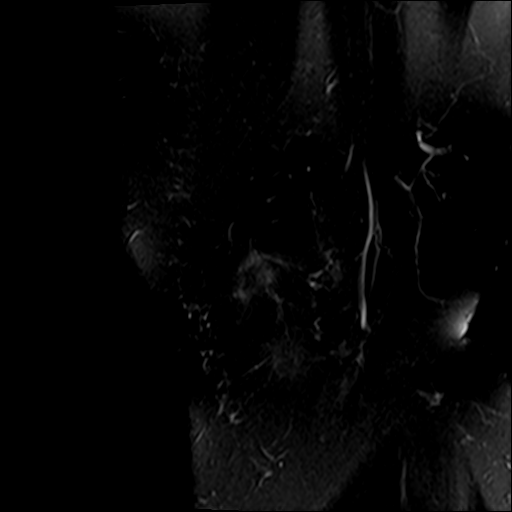
[im 11/33]
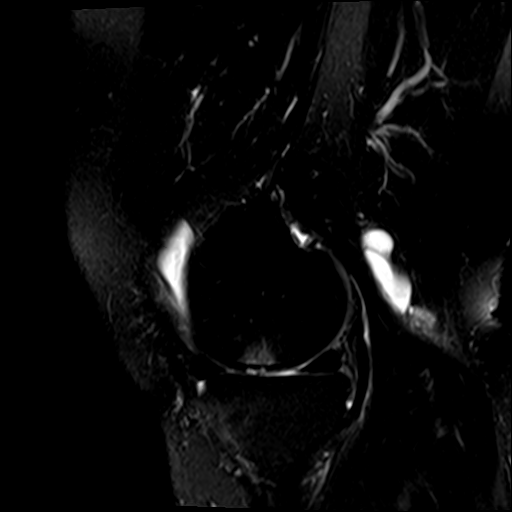
[im 17/33]
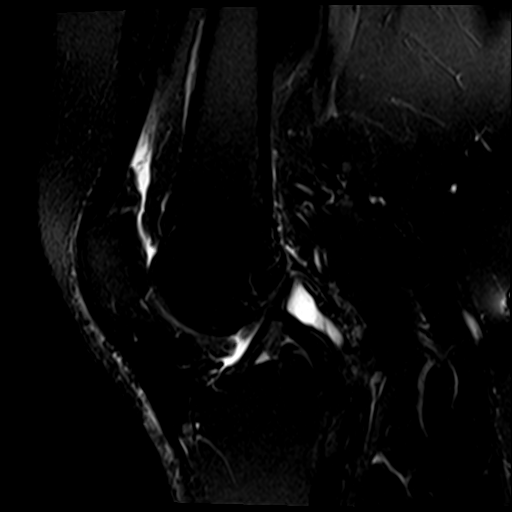
[im 22/33]
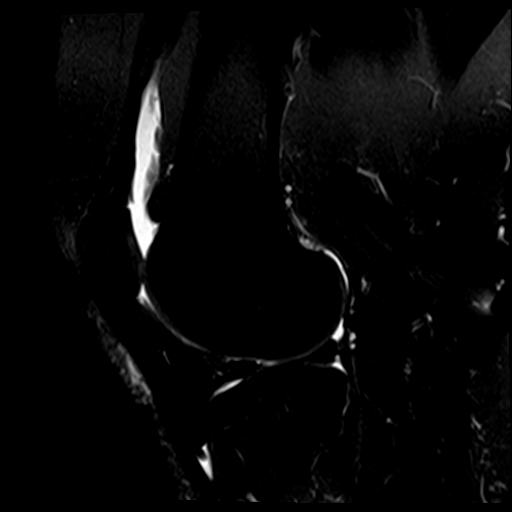
[im 27/33]
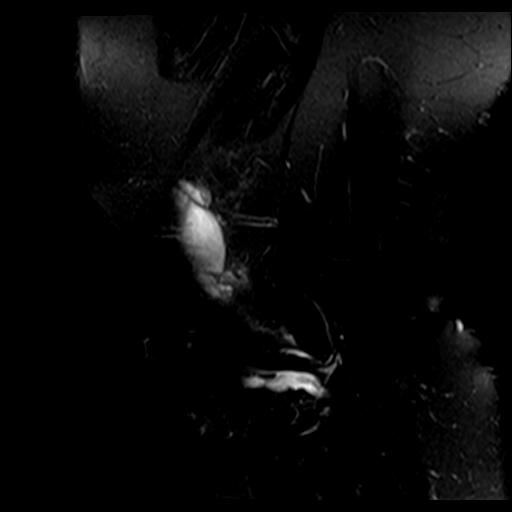

[22 of 40 positions shown; findings below may reference images not displayed]

FINDINGS: MENISCI

Medial: Radial tear of the posterior horn of the medial meniscus at
the meniscal root with peripheral meniscal extrusion. Severe
degeneration of the posterior horn of the medial meniscus.

Lateral: Small radial tear of the free edge of the body of the
lateral meniscus.

LIGAMENTS

Cruciates: ACL and PCL are intact.

Collaterals: Medial collateral ligament is intact. Lateral
collateral ligament complex is intact.

CARTILAGE

Patellofemoral: Partial-thickness cartilage loss of the medial
patellar facet.

Medial: High-grade partial-thickness cartilage loss with areas of
full-thickness cartilage loss of the medial femorotibial
compartment. Subchondral reactive marrow edema in the medial femoral
condyle.

Lateral:  No chondral defect.

JOINT: Small joint effusion. Normal [place]. No plical
thickening.

POPLITEAL FOSSA: Popliteus tendon is intact. Small Baker's cyst.

EXTENSOR MECHANISM: Intact quadriceps tendon. Mild tendinosis of the
proximal patellar tendon. Intact medial patellar retinaculum. Intact
lateral patellar retinaculum. Intact MPFL.

BONES: No aggressive osseous lesion. No fracture or dislocation.

Other: No fluid collection or hematoma. Muscles are normal.
IMPRESSION: 1. Radial tear of the posterior horn of the medial meniscus at the
meniscal root with peripheral meniscal extrusion. Severe
degeneration of the posterior horn of the medial meniscus.
2. Small radial tear of the free edge of the body of the lateral
meniscus.
3. Partial-thickness cartilage loss of the medial patellar facet.
4. High-grade partial-thickness cartilage loss with areas of
full-thickness cartilage loss of the medial femorotibial
compartment. Subchondral reactive marrow edema in the medial femoral
condyle.

## 2022-03-27 DIAGNOSIS — G4733 Obstructive sleep apnea (adult) (pediatric): Secondary | ICD-10-CM | POA: Diagnosis not present

## 2022-04-14 ENCOUNTER — Ambulatory Visit (HOSPITAL_COMMUNITY): Payer: 59

## 2022-04-14 ENCOUNTER — Inpatient Hospital Stay: Payer: 59

## 2022-04-16 ENCOUNTER — Ambulatory Visit: Payer: 59 | Admitting: Internal Medicine

## 2022-04-30 ENCOUNTER — Other Ambulatory Visit: Payer: Self-pay

## 2022-04-30 DIAGNOSIS — C7A090 Malignant carcinoid tumor of the bronchus and lung: Secondary | ICD-10-CM

## 2022-05-01 ENCOUNTER — Telehealth: Payer: Self-pay | Admitting: Internal Medicine

## 2022-05-01 ENCOUNTER — Ambulatory Visit (INDEPENDENT_AMBULATORY_CARE_PROVIDER_SITE_OTHER): Payer: 59 | Admitting: Thoracic Surgery (Cardiothoracic Vascular Surgery)

## 2022-05-01 ENCOUNTER — Ambulatory Visit (HOSPITAL_COMMUNITY)
Admission: RE | Admit: 2022-05-01 | Discharge: 2022-05-01 | Disposition: A | Discharge status: Home / Self Care | Payer: 59 | Source: Ambulatory Visit | Attending: Internal Medicine | Admitting: Internal Medicine

## 2022-05-01 ENCOUNTER — Other Ambulatory Visit: Payer: Self-pay

## 2022-05-01 ENCOUNTER — Inpatient Hospital Stay: Payer: 59 | Attending: Internal Medicine

## 2022-05-01 VITALS — BP 165/85 | HR 117 | Ht 65.0 in

## 2022-05-01 DIAGNOSIS — M797 Fibromyalgia: Secondary | ICD-10-CM | POA: Insufficient documentation

## 2022-05-01 DIAGNOSIS — C7A090 Malignant carcinoid tumor of the bronchus and lung: Secondary | ICD-10-CM

## 2022-05-01 DIAGNOSIS — C3431 Malignant neoplasm of lower lobe, right bronchus or lung: Secondary | ICD-10-CM | POA: Insufficient documentation

## 2022-05-01 DIAGNOSIS — Z9889 Other specified postprocedural states: Secondary | ICD-10-CM | POA: Diagnosis not present

## 2022-05-01 DIAGNOSIS — C3490 Malignant neoplasm of unspecified part of unspecified bronchus or lung: Secondary | ICD-10-CM | POA: Insufficient documentation

## 2022-05-01 DIAGNOSIS — J984 Other disorders of lung: Secondary | ICD-10-CM | POA: Diagnosis not present

## 2022-05-01 DIAGNOSIS — K760 Fatty (change of) liver, not elsewhere classified: Secondary | ICD-10-CM | POA: Diagnosis not present

## 2022-05-01 DIAGNOSIS — Z79899 Other long term (current) drug therapy: Secondary | ICD-10-CM | POA: Diagnosis not present

## 2022-05-01 DIAGNOSIS — F419 Anxiety disorder, unspecified: Secondary | ICD-10-CM | POA: Insufficient documentation

## 2022-05-01 DIAGNOSIS — R911 Solitary pulmonary nodule: Secondary | ICD-10-CM | POA: Diagnosis not present

## 2022-05-01 LAB — CMP (CANCER CENTER ONLY)
ALT: 24 U/L (ref 0–44)
AST: 18 U/L (ref 15–41)
Albumin: 4.2 g/dL (ref 3.5–5.0)
Alkaline Phosphatase: 75 U/L (ref 38–126)
Anion gap: 7 (ref 5–15)
BUN: 17 mg/dL (ref 8–23)
CO2: 29 mmol/L (ref 22–32)
Calcium: 9.4 mg/dL (ref 8.9–10.3)
Chloride: 103 mmol/L (ref 98–111)
Creatinine: 0.9 mg/dL (ref 0.44–1.00)
GFR, Estimated: >60 mL/min (ref >60)
Glucose, Bld: 119 mg/dL — ABNORMAL HIGH (ref 70–99)
Potassium: 4.2 mmol/L (ref 3.5–5.1)
Sodium: 139 mmol/L (ref 135–145)
Total Bilirubin: 0.5 mg/dL (ref 0.3–1.2)
Total Protein: 7.5 g/dL (ref 6.5–8.1)

## 2022-05-01 LAB — CBC WITH DIFFERENTIAL (CANCER CENTER ONLY)
Abs Immature Granulocytes: 0.01 10*3/uL (ref 0.00–0.07)
Basophils Absolute: 0.1 10*3/uL (ref 0.0–0.1)
Basophils Relative: 1 %
Eosinophils Absolute: 0.2 10*3/uL (ref 0.0–0.5)
Eosinophils Relative: 2 %
HCT: 38.9 % (ref 36.0–46.0)
Hemoglobin: 13 g/dL (ref 12.0–15.0)
Immature Granulocytes: 0 %
Lymphocytes Relative: 29 %
Lymphs Abs: 2.1 10*3/uL (ref 0.7–4.0)
MCH: 31.3 pg (ref 26.0–34.0)
MCHC: 33.4 g/dL (ref 30.0–36.0)
MCV: 93.5 fL (ref 80.0–100.0)
Monocytes Absolute: 0.5 10*3/uL (ref 0.1–1.0)
Monocytes Relative: 7 %
Neutro Abs: 4.5 10*3/uL (ref 1.7–7.7)
Neutrophils Relative %: 61 %
Platelet Count: 342 10*3/uL (ref 150–400)
RBC: 4.16 MIL/uL (ref 3.87–5.11)
RDW: 13.6 % (ref 11.5–15.5)
WBC Count: 7.3 10*3/uL (ref 4.0–10.5)
nRBC: 0 % (ref 0.0–0.2)

## 2022-05-01 MED ORDER — SODIUM CHLORIDE (PF) 0.9 % IJ SOLN
INTRAMUSCULAR | Status: AC
  Filled 2022-05-01: qty 50

## 2022-05-01 MED ORDER — IOHEXOL 300 MG/ML  SOLN
75.0000 mL | Freq: Once | INTRAMUSCULAR | Status: AC | PRN
  Administered 2022-05-01: 75 mL via INTRAVENOUS

## 2022-05-01 NOTE — Telephone Encounter (Signed)
Per 6/23 phone lines pt called and asked to cancel appointment on 6/27 with MM she said she would call back soon to reschedule  appointment was canceled per pt request

## 2022-05-05 ENCOUNTER — Inpatient Hospital Stay: Payer: 59 | Admitting: Internal Medicine

## 2022-05-08 ENCOUNTER — Encounter (HOSPITAL_BASED_OUTPATIENT_CLINIC_OR_DEPARTMENT_OTHER): Payer: Self-pay | Admitting: Physical Therapy

## 2022-05-08 ENCOUNTER — Encounter (INDEPENDENT_AMBULATORY_CARE_PROVIDER_SITE_OTHER): Payer: 59

## 2022-05-08 ENCOUNTER — Ambulatory Visit (INDEPENDENT_AMBULATORY_CARE_PROVIDER_SITE_OTHER): Payer: 59 | Admitting: Orthopaedic Surgery

## 2022-05-08 DIAGNOSIS — S83241A Other tear of medial meniscus, current injury, right knee, initial encounter: Secondary | ICD-10-CM | POA: Diagnosis not present

## 2022-05-08 DIAGNOSIS — M79662 Pain in left lower leg: Secondary | ICD-10-CM

## 2022-05-08 NOTE — Progress Notes (Signed)
Post Operative Evaluation    Procedure/Date of Surgery: left knee medial root 1/31  Interval History:   Presents today status post left knee medial root repair done 5 months prior.  Overall she is having no knee pain at today's visit.  She is having some left calf tenderness which she states was initially swollen.  Her husband has been working on mobilizing this.  She is here today for further assessment.  PMH/PSH/Family History/Social History/Meds/Allergies:    Past Medical History:  Diagnosis Date   Dyspnea    Dysrhythmia    Endometrial polyp    Family history of adverse reaction to anesthesia    Patient states her mother had an adverse reaction to anesthesia but she is not sure exactly what it was   Fibromyalgia    GAD (generalized anxiety disorder)    History of panic attacks    Hypertension    followed by pcp   Medical history non-contributory    Neuropathy    OSA on CPAP    followed by dr dohmeier--- per study 02-02-2018 moderate to severe osa   Right bundle branch block    Sjogren's disease ([place])    Systemic lupus ([place])    rheumonotologist-- dr Page Spiro   Past Surgical History:  Procedure Laterality Date   [place];  1989   CHONDROPLASTY Left 12/09/2021   Procedure: LEFT KNEE MEDIAL ROOT REPAIR, FEMORAL CHONDROPLASTY;  Surgeon: Vanetta Mulders, MD;  Location: [facility];  Service: Orthopedics;  Laterality: Left;  general with regional block   COLONOSCOPY WITH PROPOFOL  10/2012   DILATATION & CURETTAGE/HYSTEROSCOPY WITH MYOSURE N/A 11/05/2020   Procedure: [place];  Surgeon: Princess Bruins, MD;  Location: [facility];  Service: Gynecology;  Laterality: N/A;  request to follow in [place] block time ~10:00am requests one hour   FIDUCIAL MARKER PLACEMENT  09/11/2021   Procedure: FIDUCIAL DYE MARKING;  Surgeon: Garner Nash, DO;  Location:  [facility];  Service: Pulmonary;;   INTERCOSTAL NERVE BLOCK Right 03/26/2021   Procedure: INTERCOSTAL NERVE BLOCK;  Surgeon: Lajuana Matte, MD;  Location: [facility];  Service: Thoracic;  Laterality: Right;   INTERCOSTAL NERVE BLOCK Right 09/11/2021   Procedure: INTERCOSTAL NERVE BLOCK;  Surgeon: Lajuana Matte, MD;  Location: [facility];  Service: Thoracic;  Laterality: Right;   LYMPH NODE DISSECTION  03/26/2021   Procedure: LYMPH NODE DISSECTION;  Surgeon: Lajuana Matte, MD;  Location: [facility];  Service: Thoracic;;   LYMPH NODE DISSECTION Right 09/11/2021   Procedure: LYMPH NODE DISSECTION;  Surgeon: Lajuana Matte, MD;  Location: [facility];  Service: Thoracic;  Laterality: Right;   thorascopy Right    Robotic Assisted   VIDEO BRONCHOSCOPY WITH ENDOBRONCHIAL NAVIGATION N/A 09/11/2021   Procedure: VIDEO BRONCHOSCOPY WITH ENDOBRONCHIAL NAVIGATION;  Surgeon: Garner Nash, DO;  Location: [facility];  Service: Pulmonary;  Laterality: N/A;   Social History   Socioeconomic History   Marital status: Married    Spouse name: Not on file   Number of children: 2   Years of education: post-grad   Highest education level: Not on file  Occupational History   Not on file  Tobacco Use   Smoking status: Former    Years: 10.00    Types: Cigarettes  Quit date: 13    Years since quitting: 34.5   Smokeless tobacco: Never  Vaping Use   Vaping Use: Never used  Substance and Sexual Activity   Alcohol use: Yes    Comment: occasional- social   Drug use: Never   Sexual activity: Yes    Partners: Male    Birth control/protection: Post-menopausal    Comment: 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44, PARTNERS- 2, MARRIED- 47 YRS   Other Topics Concern   Not on file  Social History Narrative   Lives at home with her husband   Right handed   2 cups of caffeine daily   Social Determinants of Health   Financial Resource Strain: Not on file  Food Insecurity: Not on file  Transportation Needs: Not on  file  Physical Activity: Not on file  Stress: Not on file  Social Connections: Not on file   Family History  Problem Relation Age of Onset   Hypertension Mother    Osteoarthritis Mother    Cancer Father        PROSTATE   Hypertension Father    Heart failure Father    Lung disease Father    Breast cancer Sister    Hypertension Sister    Lupus Sister    Sarcoidosis Paternal Aunt    Cancer Brother        PROSTATE   Lupus Brother    Hypertension Brother    Hypertension Brother    Hypertension Brother    Allergies  Allergen Reactions   Penicillins Anaphylaxis   Current Outpatient Medications  Medication Sig Dispense Refill   acetaminophen (TYLENOL) 500 MG tablet Take 1,000 mg by mouth every 6 (six) hours as needed for moderate pain or headache.     ALPRAZolam (XANAX) 0.25 MG tablet Take 0.25 mg by mouth 2 (two) times daily as needed for anxiety.     aspirin EC 325 MG tablet Take 1 tablet (325 mg total) by mouth daily. 30 tablet 0   Cyanocobalamin (B-12) 3000 MCG CAPS Take 3,000 mg by mouth daily.     DULoxetine (CYMBALTA) 30 MG capsule Take 1 capsule (30 mg total) by mouth 2 (two) times daily. 180 capsule 3   fluticasone (FLOVENT HFA) 110 MCG/ACT inhaler Inhale 2 puffs into the lungs daily. (Patient not taking: Reported on 05/01/2022) 12 g 3   HYDROcodone-acetaminophen (NORCO/VICODIN) 5-325 MG tablet Take 1 tablet by mouth every 4 (four) hours as needed for moderate pain. (Patient not taking: Reported on 05/01/2022) 30 tablet 0   linaclotide (LINZESS) 145 MCG CAPS capsule Take 1 capsule (145 mcg total) by mouth daily before breakfast. (Patient not taking: Reported on 05/01/2022) 30 capsule 2   losartan-hydrochlorothiazide (HYZAAR) 50-12.5 MG tablet Take 1 tablet by mouth daily. 90 tablet 3   MELATONIN PO Take 1 capsule by mouth at bedtime as needed (sleep).     METAMUCIL FIBER PO Take 1 Scoop by mouth daily as needed (constipation).     OVER THE COUNTER MEDICATION Take 2 tablets by  mouth daily. Olly digestive enzyme blend     oxyCODONE (OXY IR/ROXICODONE) 5 MG immediate release tablet Take 1 tablet (5 mg total) by mouth every 4 (four) hours as needed (severe pain). (Patient not taking: Reported on 05/01/2022) 20 tablet 0   Respiratory Therapy Supplies (CARETOUCH 2 CPAP HOSE HANGER) MISC by Does not apply route.     rosuvastatin (CRESTOR) 10 MG tablet Take 1 tablet (10 mg total) by mouth daily. 90 tablet 3   No  current facility-administered medications for this visit.   No results found.  Review of Systems:   A ROS was performed including pertinent positives and negatives as documented in the HPI.   Musculoskeletal Exam:    There were no vitals taken for this visit.  Incisions well-healed.  Range of motion is from 0 to 135 degrees without pain.  No joint line tenderness.  Sensation intact all distributions.  There is fullness about the left leg and swelling compared to the contralateral side.  Imaging:    None  I personally reviewed and interpreted the radiographs.   Assessment:   61 year old female 5 months status post left meniscal root repair overall doing well from a root standpoint.  She is having some calf swelling and tenderness.  To that effect I recommended that we perform DVT ultrasound today.  If this is negative we will plan on resuming physical therapy so that they can help mobilize the tightness in her calf. Plan :    -Return to clinic in 4 weeks    I personally saw and evaluated the patient, and participated in the management and treatment plan.  Vanetta Mulders, MD Attending Physician, Orthopedic Surgery  This document was dictated using Dragon voice recognition software. A reasonable attempt at proof reading has been made to minimize errors.

## 2022-05-08 NOTE — Addendum Note (Signed)
Addended by: Raynelle Fanning A on: 05/08/2022 09:34 AM   Modules accepted: Orders

## 2022-05-11 ENCOUNTER — Ambulatory Visit (HOSPITAL_BASED_OUTPATIENT_CLINIC_OR_DEPARTMENT_OTHER): Payer: 59 | Attending: Orthopaedic Surgery | Admitting: Physical Therapy

## 2022-05-11 ENCOUNTER — Encounter (HOSPITAL_BASED_OUTPATIENT_CLINIC_OR_DEPARTMENT_OTHER): Payer: Self-pay | Admitting: Physical Therapy

## 2022-05-11 DIAGNOSIS — R262 Difficulty in walking, not elsewhere classified: Secondary | ICD-10-CM | POA: Diagnosis not present

## 2022-05-11 DIAGNOSIS — M79662 Pain in left lower leg: Secondary | ICD-10-CM | POA: Insufficient documentation

## 2022-05-11 NOTE — Therapy (Signed)
OUTPATIENT PHYSICAL THERAPY LOWER EXTREMITY EVALUATION   Patient Name: Patricia Mathews MRN: [id number] DOB:1961/03/25, 60 y.o., female Today's Date: 05/11/2022   PT End of Session - 05/11/22 1519     Visit Number 1    Number of Visits 8    Date for PT Re-Evaluation 06/12/22    Authorization Type [facility]    PT Start Time 1345    PT Stop Time 1425    PT Time Calculation (min) 40 min    Activity Tolerance Patient tolerated treatment well    Behavior During Therapy WFL for tasks assessed/performed             Past Medical History:  Diagnosis Date   Dyspnea    Dysrhythmia    Endometrial polyp    Family history of adverse reaction to anesthesia    Patient states her mother had an adverse reaction to anesthesia but she is not sure exactly what it was   Fibromyalgia    GAD (generalized anxiety disorder)    History of panic attacks    Hypertension    followed by pcp   Medical history non-contributory    Neuropathy    OSA on CPAP    followed by dr dohmeier--- per study 02-02-2018 moderate to severe osa   Right bundle branch block    Sjogren's disease ([place])    Systemic lupus ([place])    rheumonotologist-- dr Page Spiro   Past Surgical History:  Procedure Laterality Date   [place];  1989   CHONDROPLASTY Left 12/09/2021   Procedure: LEFT KNEE MEDIAL ROOT REPAIR, FEMORAL CHONDROPLASTY;  Surgeon: Vanetta Mulders, MD;  Location: [facility];  Service: Orthopedics;  Laterality: Left;  general with regional block   COLONOSCOPY WITH PROPOFOL  10/2012   DILATATION & CURETTAGE/HYSTEROSCOPY WITH MYOSURE N/A 11/05/2020   Procedure: [place];  Surgeon: Princess Bruins, MD;  Location: [facility];  Service: Gynecology;  Laterality: N/A;  request to follow in [place] block time ~10:00am requests one hour   FIDUCIAL MARKER PLACEMENT  09/11/2021   Procedure: FIDUCIAL DYE MARKING;  Surgeon: Garner Nash, DO;  Location: [facility];  Service: Pulmonary;;   INTERCOSTAL NERVE BLOCK Right 03/26/2021   Procedure: INTERCOSTAL NERVE BLOCK;  Surgeon: Lajuana Matte, MD;  Location: [facility];  Service: Thoracic;  Laterality: Right;   INTERCOSTAL NERVE BLOCK Right 09/11/2021   Procedure: INTERCOSTAL NERVE BLOCK;  Surgeon: Lajuana Matte, MD;  Location: [facility];  Service: Thoracic;  Laterality: Right;   LYMPH NODE DISSECTION  03/26/2021   Procedure: LYMPH NODE DISSECTION;  Surgeon: Lajuana Matte, MD;  Location: [facility];  Service: Thoracic;;   LYMPH NODE DISSECTION Right 09/11/2021   Procedure: LYMPH NODE DISSECTION;  Surgeon: Lajuana Matte, MD;  Location: [facility];  Service: Thoracic;  Laterality: Right;   thorascopy Right    Robotic Assisted   VIDEO BRONCHOSCOPY WITH ENDOBRONCHIAL NAVIGATION N/A 09/11/2021   Procedure: VIDEO BRONCHOSCOPY WITH ENDOBRONCHIAL NAVIGATION;  Surgeon: Garner Nash, DO;  Location: [facility];  Service: Pulmonary;  Laterality: N/A;   Patient Active Problem List   Diagnosis Date Noted   Acute medial meniscus tear of right knee    Plica of knee, left    Dyslipidemia 10/24/2021   Hypertension 10/24/2021   Neuropathic pain 10/24/2021   S/P partial lobectomy of lung 09/11/2021   Carcinoid tumor    Malignant carcinoid tumor of the bronchus and lung ([place])  04/18/2021   Drug induced constipation 04/18/2021   Right lower lobe pulmonary nodule 03/26/2021   Essential hypertension, benign 12/25/2019   Other forms of systemic lupus erythematosus ([place]) 12/25/2019   Sjogren's syndrome ([place]) 12/25/2019   GAD (generalized anxiety disorder) 12/25/2019   Obstructive sleep apnea treated with continuous positive airway pressure (CPAP) 09/01/2018   Sleep related headaches 01/03/2018   Snoring 01/03/2018   Hypersomnia with sleep apnea 01/03/2018   Arthralgia 01/03/2018   Multiple neurological symptoms 12/28/2017    PCP: Agustina Caroli, MD  REFERRING  PROVIDER: Vanetta Mulders, MD  REFERRING DIAG: [phone] (ICD-10-CM) - Tenderness of left calf Program for L calf/gastroc tightness // negative for DVT  THERAPY DIAG:  Pain in left lower leg  Difficulty in walking, not elsewhere classified  Rationale for Evaluation and Treatment Rehabilitation  ONSET DATE: just about 3 weeks ago (mid June)  SUBJECTIVE:   SUBJECTIVE STATEMENT: 3 weeks ago, like a charlie horse that won't go away.   PERTINENT HISTORY: Knee surgery 6 mo ago, fibromyalgia, neuropathy, periodic limb movement disorder  PAIN:  Are you having pain? Yes: NPRS scale: moderate/10 Pain location: Lt lateral gastroc Pain description: tight Aggravating factors: walking/stretching Relieving factors: massage  PRECAUTIONS: ICD/Pacemaker  WEIGHT BEARING RESTRICTIONS No  FALLS:  Has patient fallen in last 6 months? No  LIVING ENVIRONMENT: Lives with: lives with their family Lives in: House/apartment   OCCUPATION: not working  PLOF: Independent  PATIENT GOALS decrease pain   OBJECTIVE:   DIAGNOSTIC FINDINGS: negative for DVT  EDEMA:  No pitting edema  MUSCLE LENGTH: Good length of gastroc into DF but notable tightness in Lt lateral muscle belly- allowing 10+ degrees of DF  PALPATION: Palpated trigger points recreated concordant pain  GAIT: Distance walked: within clinic Assistive device utilized: None Level of assistance: Complete Independence Comments: presents with heel toe strike but lateral calcaneus to 1st met rather than midline weight progression. Pt also notes some pain in Rt heel with wearing flat shoes.     TODAY'S TREATMENT: EVAL Trigger Point Dry Needling, Manual Therapy Treatment:  Initial or subsequent education regarding Trigger Point Dry Needling: Initial Did patient give consent to treatment with Trigger Point Dry Needling: Yes TPDN with skilled palpation and monitoring followed by STM to the following muscles: gastroc/soleus  approached from lateral aspect Standing gastroc stretch- cues to reduce LE ER Retro stepping with wide stance Ended with manual therapy with leg elevated in supine      PATIENT EDUCATION:  Education details: Anatomy of condition, POC, HEP, exercise form/rationale  Person educated: Patient and Spouse Education method: Explanation, Demonstration, Tactile cues, Verbal cues, and Handouts Education comprehension: verbalized understanding, returned demonstration, verbal cues required, tactile cues required, and needs further education   HOME EXERCISE PROGRAM: [id number]  Will trial elevation, compression stocking & DF support at night  ASSESSMENT:  CLINICAL IMPRESSION: Patient is a 61 y.o. F who was seen today for physical therapy evaluation and treatment for Lt calf pain of insidious onset.    OBJECTIVE IMPAIRMENTS Abnormal gait, decreased activity tolerance, difficulty walking, increased edema, increased muscle spasms, improper body mechanics, and pain.   ACTIVITY LIMITATIONS standing, squatting, and locomotion level  PARTICIPATION LIMITATIONS: meal prep, cleaning, and community activity  PERSONAL FACTORS  PLMS, h/o knee surgery  are also affecting patient's functional outcome.   REHAB POTENTIAL: Good  CLINICAL DECISION MAKING: Stable/uncomplicated  EVALUATION COMPLEXITY: Low   GOALS: Goals reviewed with patient? Yes  SHORT TERM GOALS: Target date: 05/29/22 Will trial DF  support at night to determine if this helps Baseline: possible contribution of PLMS to tightness Goal status: INITIAL   LONG TERM GOALS: Target date: 06/13/22  Able to ambulate without pain Baseline:  Goal status: INITIAL  2.  Resolution of "charlie horse" sensation Baseline:  Goal status: INITIAL  3.  Decreased tension of lower leg Baseline: very tight and hardened at eval Goal status: INITIAL     PLAN: PT FREQUENCY: 2x/week  PT DURATION: 4 weeks  PLANNED INTERVENTIONS: Therapeutic  exercises, Therapeutic activity, Neuromuscular re-education, Balance training, Gait training, Patient/Family education, Joint mobilization, Dry Needling, Cryotherapy, Moist heat, Taping, Ionotophoresis 62m/ml Dexamethasone, and Manual therapy  PLAN FOR NEXT SESSION: outcome of compression, DF assist? Continue manual therapy- DN PRN  Emillie Chasen C. Farrell Pantaleo PT, DPT 05/11/22 3:38 PM

## 2022-05-13 ENCOUNTER — Ambulatory Visit (HOSPITAL_BASED_OUTPATIENT_CLINIC_OR_DEPARTMENT_OTHER): Payer: 59 | Admitting: Physical Therapy

## 2022-05-13 DIAGNOSIS — R262 Difficulty in walking, not elsewhere classified: Secondary | ICD-10-CM

## 2022-05-13 DIAGNOSIS — M79662 Pain in left lower leg: Secondary | ICD-10-CM

## 2022-05-13 NOTE — Therapy (Signed)
OUTPATIENT PHYSICAL THERAPY LOWER EXTREMITY Treatment   Patient Name: Patricia Mathews MRN: [id number] DOB:23-Oct-1961, 61 y.o., female Today's Date: 05/14/2022   PT End of Session - 05/13/22 1515     Visit Number 2    Number of Visits 8    Date for PT Re-Evaluation 06/12/22    Authorization Type [facility]    PT Start Time 1433    PT Stop Time 1514    PT Time Calculation (min) 41 min    Activity Tolerance Patient tolerated treatment well    Behavior During Therapy WFL for tasks assessed/performed              Past Medical History:  Diagnosis Date   Dyspnea    Dysrhythmia    Endometrial polyp    Family history of adverse reaction to anesthesia    Patient states her mother had an adverse reaction to anesthesia but she is not sure exactly what it was   Fibromyalgia    GAD (generalized anxiety disorder)    History of panic attacks    Hypertension    followed by pcp   Medical history non-contributory    Neuropathy    OSA on CPAP    followed by dr dohmeier--- per study 02-02-2018 moderate to severe osa   Right bundle branch block    Sjogren's disease ([place])    Systemic lupus ([place])    rheumonotologist-- dr Page Spiro   Past Surgical History:  Procedure Laterality Date   [place];  1989   CHONDROPLASTY Left 12/09/2021   Procedure: LEFT KNEE MEDIAL ROOT REPAIR, FEMORAL CHONDROPLASTY;  Surgeon: Vanetta Mulders, MD;  Location: [facility];  Service: Orthopedics;  Laterality: Left;  general with regional block   COLONOSCOPY WITH PROPOFOL  10/2012   DILATATION & CURETTAGE/HYSTEROSCOPY WITH MYOSURE N/A 11/05/2020   Procedure: [place];  Surgeon: Princess Bruins, MD;  Location: [facility];  Service: Gynecology;  Laterality: N/A;  request to follow in [place] block time ~10:00am requests one hour   FIDUCIAL MARKER PLACEMENT  09/11/2021   Procedure: FIDUCIAL DYE MARKING;  Surgeon: Garner Nash, DO;  Location: [facility];  Service: Pulmonary;;   INTERCOSTAL NERVE BLOCK Right 03/26/2021   Procedure: INTERCOSTAL NERVE BLOCK;  Surgeon: Lajuana Matte, MD;  Location: [facility];  Service: Thoracic;  Laterality: Right;   INTERCOSTAL NERVE BLOCK Right 09/11/2021   Procedure: INTERCOSTAL NERVE BLOCK;  Surgeon: Lajuana Matte, MD;  Location: [facility];  Service: Thoracic;  Laterality: Right;   LYMPH NODE DISSECTION  03/26/2021   Procedure: LYMPH NODE DISSECTION;  Surgeon: Lajuana Matte, MD;  Location: [facility];  Service: Thoracic;;   LYMPH NODE DISSECTION Right 09/11/2021   Procedure: LYMPH NODE DISSECTION;  Surgeon: Lajuana Matte, MD;  Location: [facility];  Service: Thoracic;  Laterality: Right;   thorascopy Right    Robotic Assisted   VIDEO BRONCHOSCOPY WITH ENDOBRONCHIAL NAVIGATION N/A 09/11/2021   Procedure: VIDEO BRONCHOSCOPY WITH ENDOBRONCHIAL NAVIGATION;  Surgeon: Garner Nash, DO;  Location: [facility];  Service: Pulmonary;  Laterality: N/A;   Patient Active Problem List   Diagnosis Date Noted   Acute medial meniscus tear of right knee    Plica of knee, left    Dyslipidemia 10/24/2021   Hypertension 10/24/2021   Neuropathic pain 10/24/2021   S/P partial lobectomy of lung 09/11/2021   Carcinoid tumor    Malignant carcinoid tumor of the bronchus and lung (  [place]) 04/18/2021   Drug induced constipation 04/18/2021   Right lower lobe pulmonary nodule 03/26/2021   Essential hypertension, benign 12/25/2019   Other forms of systemic lupus erythematosus ([place]) 12/25/2019   Sjogren's syndrome ([place]) 12/25/2019   GAD (generalized anxiety disorder) 12/25/2019   Obstructive sleep apnea treated with continuous positive airway pressure (CPAP) 09/01/2018   Sleep related headaches 01/03/2018   Snoring 01/03/2018   Hypersomnia with sleep apnea 01/03/2018   Arthralgia 01/03/2018   Multiple neurological symptoms 12/28/2017    PCP: Agustina Caroli, MD  REFERRING  PROVIDER: Vanetta Mulders, MD  REFERRING DIAG: [phone] (ICD-10-CM) - Tenderness of left calf Program for L calf/gastroc tightness // negative for DVT  THERAPY DIAG:  Pain in left lower leg  Difficulty in walking, not elsewhere classified  Rationale for Evaluation and Treatment Rehabilitation  ONSET DATE: just about 3 weeks ago (mid June)  SUBJECTIVE:   SUBJECTIVE STATEMENT: The next day was brutal after DN but it was worth it. It is better.   PERTINENT HISTORY: Knee surgery 6 mo ago, fibromyalgia, neuropathy, periodic limb movement disorder  PAIN:  Are you having pain? Yes: NPRS scale: moderate/10 Pain location: Lt lateral gastroc Pain description: tight Aggravating factors: walking/stretching Relieving factors: massage  PRECAUTIONS: ICD/Pacemaker  WEIGHT BEARING RESTRICTIONS No  FALLS:  Has patient fallen in last 6 months? No  LIVING ENVIRONMENT: Lives with: lives with their family Lives in: House/apartment   OCCUPATION: not working  PLOF: Independent  PATIENT GOALS decrease pain   OBJECTIVE:   DIAGNOSTIC FINDINGS: negative for DVT  EDEMA:  No pitting edema  MUSCLE LENGTH: Good length of gastroc into DF but notable tightness in Lt lateral muscle belly- allowing 10+ degrees of DF  PALPATION: Palpated trigger points recreated concordant pain  GAIT: Distance walked: within clinic Assistive device utilized: None Level of assistance: Complete Independence Comments: presents with heel toe strike but lateral calcaneus to 1st met rather than midline weight progression. Pt also notes some pain in Rt heel with wearing flat shoes.     TODAY'S TREATMENT:  7/5 Trigger Point Dry Needling, Manual Therapy Treatment:  Initial or subsequent education regarding Trigger Point Dry Needling: Subsequent Did patient give consent to treatment with Trigger Point Dry Needling: Yes TPDN with skilled palpation and monitoring followed by STM to the following muscles: Lt  gastroc/soleus  Roller to hamstrings and into gastroc Eccentric single leg lower on steps Slant board Backward walking Fwd walking large step with heel strike Standing gastroc stretch End of session STM to lower leg with leg elevated, trigger point release in ant tib.    EVAL Trigger Point Dry Needling, Manual Therapy Treatment:  Initial or subsequent education regarding Trigger Point Dry Needling: Initial Did patient give consent to treatment with Trigger Point Dry Needling: Yes TPDN with skilled palpation and monitoring followed by STM to the following muscles: gastroc/soleus approached from lateral aspect Standing gastroc stretch- cues to reduce LE ER Retro stepping with wide stance Ended with manual therapy with leg elevated in supine      PATIENT EDUCATION:  Education details: Anatomy of condition, POC, HEP, exercise form/rationale  Person educated: Patient and Spouse Education method: Explanation, Demonstration, Tactile cues, Verbal cues, and Handouts Education comprehension: verbalized understanding, returned demonstration, verbal cues required, tactile cues required, and needs further education   HOME EXERCISE PROGRAM: [id number]  Will trial elevation, compression stocking & DF support at night  ASSESSMENT:  CLINICAL IMPRESSION: Improvements with DN noted. Found tightness into lateral hamstrings as well.  OBJECTIVE IMPAIRMENTS Abnormal gait, decreased activity tolerance, difficulty walking, increased edema, increased muscle spasms, improper body mechanics, and pain.   ACTIVITY LIMITATIONS standing, squatting, and locomotion level  PARTICIPATION LIMITATIONS: meal prep, cleaning, and community activity  PERSONAL FACTORS  PLMS, h/o knee surgery  are also affecting patient's functional outcome.   REHAB POTENTIAL: Good  CLINICAL DECISION MAKING: Stable/uncomplicated  EVALUATION COMPLEXITY: Low   GOALS: Goals reviewed with patient? Yes  SHORT TERM GOALS:  Target date: 05/29/22 Will trial DF support at night to determine if this helps Baseline: possible contribution of PLMS to tightness Goal status: INITIAL   LONG TERM GOALS: Target date: 06/13/22  Able to ambulate without pain Baseline:  Goal status: INITIAL  2.  Resolution of "charlie horse" sensation Baseline:  Goal status: INITIAL  3.  Decreased tension of lower leg Baseline: very tight and hardened at eval Goal status: INITIAL     PLAN: PT FREQUENCY: 2x/week  PT DURATION: 4 weeks  PLANNED INTERVENTIONS: Therapeutic exercises, Therapeutic activity, Neuromuscular re-education, Balance training, Gait training, Patient/Family education, Joint mobilization, Dry Needling, Cryotherapy, Moist heat, Taping, Ionotophoresis 17m/ml Dexamethasone, and Manual therapy  PLAN FOR NEXT SESSION: continue eccentric lengthening and DN PRN  Jaimarie Rapozo C. Ashton Belote PT, DPT 05/14/22 8:46 AM

## 2022-05-14 ENCOUNTER — Encounter (HOSPITAL_BASED_OUTPATIENT_CLINIC_OR_DEPARTMENT_OTHER): Payer: Self-pay | Admitting: Physical Therapy

## 2022-05-18 ENCOUNTER — Encounter (HOSPITAL_BASED_OUTPATIENT_CLINIC_OR_DEPARTMENT_OTHER): Payer: Self-pay

## 2022-05-18 ENCOUNTER — Ambulatory Visit (HOSPITAL_BASED_OUTPATIENT_CLINIC_OR_DEPARTMENT_OTHER): Payer: 59 | Admitting: Physical Therapy

## 2022-05-20 ENCOUNTER — Other Ambulatory Visit: Payer: Self-pay | Admitting: Emergency Medicine

## 2022-05-21 ENCOUNTER — Other Ambulatory Visit (HOSPITAL_COMMUNITY): Payer: Self-pay

## 2022-05-21 MED ORDER — ALPRAZOLAM 0.25 MG PO TABS
0.2500 mg | ORAL_TABLET | Freq: Two times a day (BID) | ORAL | 3 refills | Status: DC | PRN
Start: 2022-05-21 — End: 2023-02-25
  Filled 2022-05-21: qty 30, 15d supply, fill #0
  Filled 2022-08-26: qty 30, 15d supply, fill #1
  Filled 2022-10-26: qty 30, 15d supply, fill #2

## 2022-05-22 ENCOUNTER — Ambulatory Visit (HOSPITAL_BASED_OUTPATIENT_CLINIC_OR_DEPARTMENT_OTHER): Payer: 59 | Admitting: Physical Therapy

## 2022-05-22 ENCOUNTER — Encounter (HOSPITAL_BASED_OUTPATIENT_CLINIC_OR_DEPARTMENT_OTHER): Payer: Self-pay | Admitting: Physical Therapy

## 2022-05-22 DIAGNOSIS — R262 Difficulty in walking, not elsewhere classified: Secondary | ICD-10-CM | POA: Diagnosis not present

## 2022-05-22 DIAGNOSIS — M79662 Pain in left lower leg: Secondary | ICD-10-CM | POA: Diagnosis not present

## 2022-05-22 NOTE — Therapy (Signed)
OUTPATIENT PHYSICAL THERAPY LOWER EXTREMITY Treatment   Patient Name: Patricia Mathews MRN: [id number] DOB:[birth year], 61 y.o., female Today's Date: 05/22/2022   PT End of Session - 05/22/22 0931     Visit Number 3    Number of Visits 8    Date for PT Re-Evaluation 06/12/22    Authorization Type [facility]    PT Start Time 0930    PT Stop Time 1003    PT Time Calculation (min) 33 min    Activity Tolerance Patient tolerated treatment well    Behavior During Therapy WFL for tasks assessed/performed              Past Medical History:  Diagnosis Date   Dyspnea    Dysrhythmia    Endometrial polyp    Family history of adverse reaction to anesthesia    Patient states her mother had an adverse reaction to anesthesia but she is not sure exactly what it was   Fibromyalgia    GAD (generalized anxiety disorder)    History of panic attacks    Hypertension    followed by pcp   Medical history non-contributory    Neuropathy    OSA on CPAP    followed by dr dohmeier--- per study 02-02-2018 moderate to severe osa   Right bundle branch block    Sjogren's disease ([place])    Systemic lupus ([place])    rheumonotologist-- dr Page Spiro   Past Surgical History:  Procedure Laterality Date   Onset;  1989   CHONDROPLASTY Left 12/09/2021   Procedure: LEFT KNEE MEDIAL ROOT REPAIR, FEMORAL CHONDROPLASTY;  Surgeon: Vanetta Mulders, MD;  Location: [facility];  Service: Orthopedics;  Laterality: Left;  general with regional block   COLONOSCOPY WITH PROPOFOL  10/2012   DILATATION & CURETTAGE/HYSTEROSCOPY WITH MYOSURE N/A 11/05/2020   Procedure: [place];  Surgeon: Princess Bruins, MD;  Location: [facility];  Service: Gynecology;  Laterality: N/A;  request to follow in [place] block time ~10:00am requests one hour   FIDUCIAL MARKER PLACEMENT  09/11/2021   Procedure: FIDUCIAL DYE MARKING;  Surgeon: Garner Nash, DO;  Location: [facility];  Service: Pulmonary;;   INTERCOSTAL NERVE BLOCK Right 03/26/2021   Procedure: INTERCOSTAL NERVE BLOCK;  Surgeon: Lajuana Matte, MD;  Location: [facility];  Service: Thoracic;  Laterality: Right;   INTERCOSTAL NERVE BLOCK Right 09/11/2021   Procedure: INTERCOSTAL NERVE BLOCK;  Surgeon: Lajuana Matte, MD;  Location: [facility];  Service: Thoracic;  Laterality: Right;   LYMPH NODE DISSECTION  03/26/2021   Procedure: LYMPH NODE DISSECTION;  Surgeon: Lajuana Matte, MD;  Location: [facility];  Service: Thoracic;;   LYMPH NODE DISSECTION Right 09/11/2021   Procedure: LYMPH NODE DISSECTION;  Surgeon: Lajuana Matte, MD;  Location: [facility];  Service: Thoracic;  Laterality: Right;   thorascopy Right    Robotic Assisted   VIDEO BRONCHOSCOPY WITH ENDOBRONCHIAL NAVIGATION N/A 09/11/2021   Procedure: VIDEO BRONCHOSCOPY WITH ENDOBRONCHIAL NAVIGATION;  Surgeon: Garner Nash, DO;  Location: [facility];  Service: Pulmonary;  Laterality: N/A;   Patient Active Problem List   Diagnosis Date Noted   Acute medial meniscus tear of right knee    [place] of knee, left    Dyslipidemia 10/24/2021   Hypertension 10/24/2021   Neuropathic pain 10/24/2021   S/P partial lobectomy of lung 09/11/2021   Carcinoid tumor    Malignant carcinoid tumor of the bronchus and lung (  [place]) 04/18/2021   Drug induced constipation 04/18/2021   Right lower lobe pulmonary nodule 03/26/2021   Essential hypertension, benign 12/25/2019   Other forms of systemic lupus erythematosus ([place]) 12/25/2019   Sjogren's syndrome ([place]) 12/25/2019   GAD (generalized anxiety disorder) 12/25/2019   Obstructive sleep apnea treated with continuous positive airway pressure (CPAP) 09/01/2018   Sleep related headaches 01/03/2018   Snoring 01/03/2018   Hypersomnia with sleep apnea 01/03/2018   Arthralgia 01/03/2018   Multiple neurological symptoms 12/28/2017    PCP: Agustina Caroli, MD  REFERRING  PROVIDER: Vanetta Mulders, MD  REFERRING DIAG: [phone] (ICD-10-CM) - Tenderness of left calf Program for L calf/gastroc tightness // negative for DVT  THERAPY DIAG:  Pain in left lower leg  Difficulty in walking, not elsewhere classified  Rationale for Evaluation and Treatment Rehabilitation  ONSET DATE: just about 3 weeks ago (mid June)  SUBJECTIVE:   SUBJECTIVE STATEMENT: I got stung by a bee so had to cancel last appointment. My calf feels better but I have been getting random sharp pains in my knee.   PERTINENT HISTORY: Knee surgery 6 mo ago, fibromyalgia, neuropathy, periodic limb movement disorder  PAIN:  Are you having pain? Yes: NPRS scale: moderate/10 Pain location: Lt lateral gastroc Pain description: tight Aggravating factors: walking/stretching Relieving factors: massage  PRECAUTIONS: ICD/Pacemaker  WEIGHT BEARING RESTRICTIONS No  FALLS:  Has patient fallen in last 6 months? No  LIVING ENVIRONMENT: Lives with: lives with their family Lives in: House/apartment   OCCUPATION: not working  PLOF: Independent  PATIENT GOALS decrease pain   OBJECTIVE:   DIAGNOSTIC FINDINGS: negative for DVT  EDEMA:  No pitting edema  MUSCLE LENGTH: Good length of gastroc into DF but notable tightness in Lt lateral muscle belly- allowing 10+ degrees of DF  PALPATION: Palpated trigger points recreated concordant pain  GAIT: Distance walked: within clinic Assistive device utilized: None Level of assistance: Complete Independence Comments: presents with heel toe strike but lateral calcaneus to 1st met rather than midline weight progression. Pt also notes some pain in Rt heel with wearing flat shoes.     TODAY'S TREATMENT:  7/14 Trigger Point Dry Needling, Manual Therapy Treatment:  Initial or subsequent education regarding Trigger Point Dry Needling: Subsequent Did patient give consent to treatment with Trigger Point Dry Needling: Yes TPDN with skilled  palpation and monitoring followed by STM to the following muscles: Lt lateral gastroc  Further STM to gastroc/soleus Edema mob at post knee, ant-lateral patellar tendon fluid mob with patellar mobs ktape spider over lateral patellar tendon region    7/5 Trigger Point Dry Needling, Manual Therapy Treatment:  Initial or subsequent education regarding Trigger Point Dry Needling: Subsequent Did patient give consent to treatment with Trigger Point Dry Needling: Yes TPDN with skilled palpation and monitoring followed by STM to the following muscles: Lt gastroc/soleus  Roller to hamstrings and into gastroc Eccentric single leg lower on steps Slant board Backward walking Fwd walking large step with heel strike Standing gastroc stretch End of session STM to lower leg with leg elevated, trigger point release in ant tib.    EVAL Trigger Point Dry Needling, Manual Therapy Treatment:  Initial or subsequent education regarding Trigger Point Dry Needling: Initial Did patient give consent to treatment with Trigger Point Dry Needling: Yes TPDN with skilled palpation and monitoring followed by STM to the following muscles: gastroc/soleus approached from lateral aspect Standing gastroc stretch- cues to reduce LE ER Retro stepping with wide stance Ended with manual therapy with  leg elevated in supine      PATIENT EDUCATION:  Education details: Anatomy of condition, POC, HEP, exercise form/rationale  Person educated: Patient and Spouse Education method: Explanation, Demonstration, Tactile cues, Verbal cues, and Handouts Education comprehension: verbalized understanding, returned demonstration, verbal cues required, tactile cues required, and needs further education   HOME EXERCISE PROGRAM: [id number]  Will trial elevation, compression stocking & DF support at night  ASSESSMENT:  CLINICAL IMPRESSION: Less tension in lower leg noted but still had trigger point in lateral gastroc that created  concordant pain & was treated with DN & manual STM today. New pocket of fluid lateral to patellar tendon noted and will keep an eye on.    OBJECTIVE IMPAIRMENTS Abnormal gait, decreased activity tolerance, difficulty walking, increased edema, increased muscle spasms, improper body mechanics, and pain.   ACTIVITY LIMITATIONS standing, squatting, and locomotion level  PARTICIPATION LIMITATIONS: meal prep, cleaning, and community activity  PERSONAL FACTORS  PLMS, h/o knee surgery  are also affecting patient's functional outcome.   REHAB POTENTIAL: Good  CLINICAL DECISION MAKING: Stable/uncomplicated  EVALUATION COMPLEXITY: Low   GOALS: Goals reviewed with patient? Yes  SHORT TERM GOALS: Target date: 05/29/22 Will trial DF support at night to determine if this helps Baseline: possible contribution of PLMS to tightness Goal status: INITIAL   LONG TERM GOALS: Target date: 06/13/22  Able to ambulate without pain Baseline:  Goal status: INITIAL  2.  Resolution of "charlie horse" sensation Baseline:  Goal status: INITIAL  3.  Decreased tension of lower leg Baseline: very tight and hardened at eval Goal status: INITIAL     PLAN: PT FREQUENCY: 2x/week  PT DURATION: 4 weeks  PLANNED INTERVENTIONS: Therapeutic exercises, Therapeutic activity, Neuromuscular re-education, Balance training, Gait training, Patient/Family education, Joint mobilization, Dry Needling, Cryotherapy, Moist heat, Taping, Ionotophoresis 79m/ml Dexamethasone, and Manual therapy  PLAN FOR NEXT SESSION: continue eccentric lengthening and DN PRN  Danean Marner C. Carmeron Heady PT, DPT 05/22/22 10:07 AM

## 2022-06-01 ENCOUNTER — Ambulatory Visit (HOSPITAL_BASED_OUTPATIENT_CLINIC_OR_DEPARTMENT_OTHER): Payer: 59 | Admitting: Physical Therapy

## 2022-06-01 ENCOUNTER — Encounter (HOSPITAL_BASED_OUTPATIENT_CLINIC_OR_DEPARTMENT_OTHER): Payer: Self-pay | Admitting: Physical Therapy

## 2022-06-01 ENCOUNTER — Ambulatory Visit (INDEPENDENT_AMBULATORY_CARE_PROVIDER_SITE_OTHER): Payer: 59 | Admitting: Orthopaedic Surgery

## 2022-06-01 DIAGNOSIS — R262 Difficulty in walking, not elsewhere classified: Secondary | ICD-10-CM

## 2022-06-01 DIAGNOSIS — M79662 Pain in left lower leg: Secondary | ICD-10-CM | POA: Diagnosis not present

## 2022-06-01 DIAGNOSIS — S83241A Other tear of medial meniscus, current injury, right knee, initial encounter: Secondary | ICD-10-CM

## 2022-06-01 MED ORDER — LIDOCAINE HCL 1 % IJ SOLN
4.0000 mL | INTRAMUSCULAR | Status: AC | PRN
  Administered 2022-06-01: 4 mL

## 2022-06-01 MED ORDER — TRIAMCINOLONE ACETONIDE 40 MG/ML IJ SUSP
80.0000 mg | INTRAMUSCULAR | Status: AC | PRN
  Administered 2022-06-01: 80 mg via INTRA_ARTICULAR

## 2022-06-01 NOTE — Progress Notes (Signed)
Post Operative Evaluation    Procedure/Date of Surgery: left knee medial root 1/31  Interval History:   Presents today status post left knee medial root repair presents today with anterior knee pain.  Overall she has been working with physical therapy and her Pain is much improved.  Today the majority of her pain is in the anterior aspect of the knee as opposed to the joint line. PMH/PSH/Family History/Social History/Meds/Allergies:    Past Medical History:  Diagnosis Date  . Dyspnea   . Dysrhythmia   . Endometrial polyp   . Family history of adverse reaction to anesthesia    Patient states her mother had an adverse reaction to anesthesia but she is not sure exactly what it was  . Fibromyalgia   . GAD (generalized anxiety disorder)   . History of panic attacks   . Hypertension    followed by pcp  . Medical history non-contributory   . Neuropathy   . OSA on CPAP    followed by dr dohmeier--- per study 02-02-2018 moderate to severe osa  . Right bundle branch block   . Sjogren's disease ([place])   . Systemic lupus ([place])    rheumonotologist-- dr Page Spiro   Past Surgical History:  Procedure Laterality Date  . [place];  1989  . CHONDROPLASTY Left 12/09/2021   Procedure: LEFT KNEE MEDIAL ROOT REPAIR, FEMORAL CHONDROPLASTY;  Surgeon: Vanetta Mulders, MD;  Location: [facility];  Service: Orthopedics;  Laterality: Left;  general with regional block  . COLONOSCOPY WITH PROPOFOL  10/2012  . DILATATION & CURETTAGE/HYSTEROSCOPY WITH MYOSURE N/A 11/05/2020   Procedure: DILATATION & CURETTAGE/HYSTEROSCOPY WITH MYOSURE;  Surgeon: Princess Bruins, MD;  Location: [facility];  Service: Gynecology;  Laterality: N/A;  request to follow in [place] block time ~10:00am requests one hour  . FIDUCIAL MARKER PLACEMENT  09/11/2021   Procedure: FIDUCIAL DYE MARKING;  Surgeon: Garner Nash, DO;  Location: [facility];  Service: Pulmonary;;  . INTERCOSTAL NERVE BLOCK Right 03/26/2021   Procedure: INTERCOSTAL NERVE BLOCK;  Surgeon: Lajuana Matte, MD;  Location: [facility];  Service: Thoracic;  Laterality: Right;  . INTERCOSTAL NERVE BLOCK Right 09/11/2021   Procedure: INTERCOSTAL NERVE BLOCK;  Surgeon: Lajuana Matte, MD;  Location: [facility];  Service: Thoracic;  Laterality: Right;  . LYMPH NODE DISSECTION  03/26/2021   Procedure: LYMPH NODE DISSECTION;  Surgeon: Lajuana Matte, MD;  Location: [facility];  Service: Thoracic;;  . LYMPH NODE DISSECTION Right 09/11/2021   Procedure: LYMPH NODE DISSECTION;  Surgeon: Lajuana Matte, MD;  Location: [facility];  Service: Thoracic;  Laterality: Right;  . thorascopy Right    Robotic Assisted  . VIDEO BRONCHOSCOPY WITH ENDOBRONCHIAL NAVIGATION N/A 09/11/2021   Procedure: VIDEO BRONCHOSCOPY WITH ENDOBRONCHIAL NAVIGATION;  Surgeon: Garner Nash, DO;  Location: [facility];  Service: Pulmonary;  Laterality: N/A;   Social History   Socioeconomic History  . Marital status: Married    Spouse name: Not on file  . Number of children: 2  . Years of education: post-grad  . Highest education level: Not on file  Occupational History  . Not on file  Tobacco Use  . Smoking status: Former    Years: 10.00    Types: Cigarettes    Quit date:  1989    Years since quitting: 34.5  . Smokeless tobacco: Never  Vaping Use  . Vaping Use: Never used  Substance and Sexual Activity  . Alcohol use: Yes    Comment: occasional- social  . Drug use: Never  . Sexual activity: Yes    Partners: Male    Birth control/protection: Post-menopausal    Comment: 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17, PARTNERS- 3, MARRIED- 8 YRS   Other Topics Concern  . Not on file  Social History Narrative   Lives at home with her husband   Right handed   2 cups of caffeine daily   Social Determinants of Health   Financial Resource Strain: Not on file  Food Insecurity: Not on file  Transportation  Needs: Not on file  Physical Activity: Not on file  Stress: Not on file  Social Connections: Not on file   Family History  Problem Relation Age of Onset  . Hypertension Mother   . Osteoarthritis Mother   . Cancer Father        PROSTATE  . Hypertension Father   . Heart failure Father   . Lung disease Father   . Breast cancer Sister   . Hypertension Sister   . Lupus Sister   . Sarcoidosis Paternal Aunt   . Cancer Brother        PROSTATE  . Lupus Brother   . Hypertension Brother   . Hypertension Brother   . Hypertension Brother    Allergies  Allergen Reactions  . Penicillins Anaphylaxis   Current Outpatient Medications  Medication Sig Dispense Refill  . acetaminophen (TYLENOL) 500 MG tablet Take 1,000 mg by mouth every 6 (six) hours as needed for moderate pain or headache.    . ALPRAZolam (XANAX) 0.25 MG tablet Take 1 tablet (0.25 mg total) by mouth 2 (two) times daily as needed for anxiety. 30 tablet 3  . aspirin EC 325 MG tablet Take 1 tablet (325 mg total) by mouth daily. 30 tablet 0  . Cyanocobalamin (B-12) 3000 MCG CAPS Take 3,000 mg by mouth daily.    . DULoxetine (CYMBALTA) 30 MG capsule Take 1 capsule (30 mg total) by mouth 2 (two) times daily. 180 capsule 3  . fluticasone (FLOVENT HFA) 110 MCG/ACT inhaler Inhale 2 puffs into the lungs daily. (Patient not taking: Reported on 05/01/2022) 12 g 3  . HYDROcodone-acetaminophen (NORCO/VICODIN) 5-325 MG tablet Take 1 tablet by mouth every 4 (four) hours as needed for moderate pain. (Patient not taking: Reported on 05/01/2022) 30 tablet 0  . linaclotide (LINZESS) 145 MCG CAPS capsule Take 1 capsule (145 mcg total) by mouth daily before breakfast. (Patient not taking: Reported on 05/01/2022) 30 capsule 2  . losartan-hydrochlorothiazide (HYZAAR) 50-12.5 MG tablet Take 1 tablet by mouth daily. 90 tablet 3  . MELATONIN PO Take 1 capsule by mouth at bedtime as needed (sleep).    . METAMUCIL FIBER PO Take 1 Scoop by mouth daily as needed  (constipation).    Marland Kitchen OVER THE COUNTER MEDICATION Take 2 tablets by mouth daily. [place] digestive enzyme blend    . oxyCODONE (OXY IR/ROXICODONE) 5 MG immediate release tablet Take 1 tablet (5 mg total) by mouth every 4 (four) hours as needed (severe pain). (Patient not taking: Reported on 05/01/2022) 20 tablet 0  . Respiratory Therapy Supplies (CARETOUCH 2 CPAP HOSE HANGER) MISC by Does not apply route.    . rosuvastatin (CRESTOR) 10 MG tablet Take 1 tablet (10 mg total) by mouth daily. 90 tablet 3  No current facility-administered medications for this visit.   No results found.  Review of Systems:   A ROS was performed including pertinent positives and negatives as documented in the HPI.   Musculoskeletal Exam:    There were no vitals taken for this visit.  Incisions well-healed.  Range of motion is from 0 to 135 degrees without pain.  Tenderness around Hoffa's fat pad.  S3nsation intact all distributions.  There is fullness about the left leg and swelling compared to the contralateral side.  Imaging:    None  I personally reviewed and interpreted the radiographs.   Assessment:   61 year old female status post left medial meniscal root repair doing well.  Overall today she does have some fat pad irritation.  That effect I have recommended ultrasound-guided injection of the knee.  We will plan to proceed with this.  I will see her back in 1 month for reassessment. Plan :    -Return to clinic in 4 weeks  Ultrasound-guided right knee injection performed today after verbal consent obtained    Procedure Note  Patient: Patricia Mathews             Date of Birth: 1961/02/10           MRN: [id number]             Visit Date: 06/01/2022  Procedures: Visit Diagnoses: No diagnosis found.  Large Joint Inj: R knee on 06/01/2022 11:28 AM Indications: pain Details: 22 G 1.5 in needle, ultrasound-guided anterior approach  Arthrogram: No  Medications: 4 mL lidocaine 1 %; 80 mg  triamcinolone acetonide 40 MG/ML Outcome: tolerated well, no immediate complications Procedure, treatment alternatives, risks and benefits explained, specific risks discussed. Consent was given by the patient. Immediately prior to procedure a time out was called to verify the correct patient, procedure, equipment, support staff and site/side marked as required. Patient was prepped and draped in the usual sterile fashion.        I personally saw and evaluated the patient, and participated in the management and treatment plan.  Vanetta Mulders, MD Attending Physician, Orthopedic Surgery  This document was dictated using Dragon voice recognition software. A reasonable attempt at proof reading has been made to minimize errors.

## 2022-06-01 NOTE — Therapy (Signed)
OUTPATIENT PHYSICAL THERAPY LOWER EXTREMITY Treatment   Patient Name: Patricia Mathews MRN: [id number] DOB:1961-09-18, 61 y.o., female Today's Date: 06/01/2022   PT End of Session - 06/01/22 0851     Visit Number 4    Number of Visits 8    Date for PT Re-Evaluation 06/12/22    Authorization Type [facility]    PT Start Time 0850    PT Stop Time 0930    PT Time Calculation (min) 40 min    Activity Tolerance Patient tolerated treatment well    Behavior During Therapy [facility] for tasks assessed/performed              Past Medical History:  Diagnosis Date   Dyspnea    Dysrhythmia    Endometrial polyp    Family history of adverse reaction to anesthesia    Patient states her mother had an adverse reaction to anesthesia but she is not sure exactly what it was   Fibromyalgia    GAD (generalized anxiety disorder)    History of panic attacks    Hypertension    followed by pcp   Medical history non-contributory    Neuropathy    OSA on CPAP    followed by dr dohmeier--- per study 02-02-2018 moderate to severe osa   Right bundle branch block    Sjogren's disease ([place])    Systemic lupus ([place])    rheumonotologist-- dr Page Spiro   Past Surgical History:  Procedure Laterality Date   [place];  1989   CHONDROPLASTY Left 12/09/2021   Procedure: LEFT KNEE MEDIAL ROOT REPAIR, FEMORAL CHONDROPLASTY;  Surgeon: Vanetta Mulders, MD;  Location: [place];  Service: Orthopedics;  Laterality: Left;  general with regional block   COLONOSCOPY WITH PROPOFOL  10/2012   DILATATION & CURETTAGE/HYSTEROSCOPY WITH MYOSURE N/A 11/05/2020   Procedure: [place];  Surgeon: Princess Bruins, MD;  Location: [place];  Service: Gynecology;  Laterality: N/A;  request to follow in [place] block time ~10:00am requests one hour   FIDUCIAL MARKER PLACEMENT  09/11/2021   Procedure: FIDUCIAL DYE MARKING;  Surgeon: Garner Nash, DO;  Location: [place] [facility];  Service: Pulmonary;;   INTERCOSTAL NERVE BLOCK Right 03/26/2021   Procedure: INTERCOSTAL NERVE BLOCK;  Surgeon: Lajuana Matte, MD;  Location: [place];  Service: Thoracic;  Laterality: Right;   INTERCOSTAL NERVE BLOCK Right 09/11/2021   Procedure: INTERCOSTAL NERVE BLOCK;  Surgeon: Lajuana Matte, MD;  Location: [place];  Service: Thoracic;  Laterality: Right;   LYMPH NODE DISSECTION  03/26/2021   Procedure: LYMPH NODE DISSECTION;  Surgeon: Lajuana Matte, MD;  Location: [place];  Service: Thoracic;;   LYMPH NODE DISSECTION Right 09/11/2021   Procedure: LYMPH NODE DISSECTION;  Surgeon: Lajuana Matte, MD;  Location: [place];  Service: Thoracic;  Laterality: Right;   thorascopy Right    Robotic Assisted   VIDEO BRONCHOSCOPY WITH ENDOBRONCHIAL NAVIGATION N/A 09/11/2021   Procedure: VIDEO BRONCHOSCOPY WITH ENDOBRONCHIAL NAVIGATION;  Surgeon: Garner Nash, DO;  Location: [place];  Service: Pulmonary;  Laterality: N/A;   Patient Active Problem List   Diagnosis Date Noted   Acute medial meniscus tear of right knee    Plica of knee, left    Dyslipidemia 10/24/2021   Hypertension 10/24/2021   Neuropathic pain 10/24/2021   S/P partial lobectomy of lung 09/11/2021   Carcinoid tumor    Malignant carcinoid tumor of the bronchus and lung (  [place]) 04/18/2021   Drug induced constipation 04/18/2021   Right lower lobe pulmonary nodule 03/26/2021   Essential hypertension, benign 12/25/2019   Other forms of systemic lupus erythematosus ([place]) 12/25/2019   Sjogren's syndrome ([place]) 12/25/2019   GAD (generalized anxiety disorder) 12/25/2019   Obstructive sleep apnea treated with continuous positive airway pressure (CPAP) 09/01/2018   Sleep related headaches 01/03/2018   Snoring 01/03/2018   Hypersomnia with sleep apnea 01/03/2018   Arthralgia 01/03/2018   Multiple neurological symptoms 12/28/2017    PCP: Agustina Caroli, MD  REFERRING  PROVIDER: Vanetta Mulders, MD  REFERRING DIAG: [phone] (ICD-10-CM) - Tenderness of left calf Program for L calf/gastroc tightness // negative for DVT  THERAPY DIAG:  Pain in left lower leg  Difficulty in walking, not elsewhere classified  Rationale for Evaluation and Treatment Rehabilitation  ONSET DATE: just about 3 weeks ago (mid June)  SUBJECTIVE:   SUBJECTIVE STATEMENT: After I do my 2 mile walk it hurts so bad. In pain for hours after about 7k steps. I can go up stairs fine before my walk but it hurts after.   PERTINENT HISTORY: Knee surgery 6 mo ago, fibromyalgia, neuropathy, periodic limb movement disorder  PAIN:  Are you having pain? Yes: NPRS scale: moderate/10 Pain location: Lt lateral gastroc Pain description: tight Aggravating factors: walking/stretching Relieving factors: massage  PRECAUTIONS: ICD/Pacemaker  WEIGHT BEARING RESTRICTIONS No  FALLS:  Has patient fallen in last 6 months? No  LIVING ENVIRONMENT: Lives with: lives with their family Lives in: House/apartment   OCCUPATION: not working  PLOF: Independent  PATIENT GOALS decrease pain   OBJECTIVE:   DIAGNOSTIC FINDINGS: negative for DVT  EDEMA:  No pitting edema  MUSCLE LENGTH: Good length of gastroc into DF but notable tightness in Lt lateral muscle belly- allowing 10+ degrees of DF  PALPATION: Palpated trigger points recreated concordant pain  GAIT: Distance walked: within clinic Assistive device utilized: None Level of assistance: Complete Independence Comments: presents with heel toe strike but lateral calcaneus to 1st met rather than midline weight progression. Pt also notes some pain in Rt heel with wearing flat shoes.     TODAY'S TREATMENT: 7/24 Gastroc stretch on slant board  Seated HSS MANUAL: IASTM Lt gastroc- lateral belly; edema mob around knee.  Sidelying arcs Clams with feet touching & with foot elevated    7/14 Trigger Point Dry Needling, Manual Therapy  Treatment:  Initial or subsequent education regarding Trigger Point Dry Needling: Subsequent Did patient give consent to treatment with Trigger Point Dry Needling: Yes TPDN with skilled palpation and monitoring followed by STM to the following muscles: Lt lateral gastroc  Further STM to gastroc/soleus Edema mob at post knee, ant-lateral patellar tendon fluid mob with patellar mobs ktape spider over lateral patellar tendon region     PATIENT EDUCATION:  Education details: Anatomy of condition, POC, HEP, exercise form/rationale  Person educated: Patient and Spouse Education method: Explanation, Demonstration, Tactile cues, Verbal cues, and Handouts Education comprehension: verbalized understanding, returned demonstration, verbal cues required, tactile cues required, and needs further education   HOME EXERCISE PROGRAM: [id number]  Will trial elevation, compression stocking & DF support at night  ASSESSMENT:  CLINICAL IMPRESSION: Able to reduce gastroc tightness with IASTM today. No change in knee pain with manual therapy, stretching and exercise. Pt did see MD following treatment & will see how she feels next visit.    OBJECTIVE IMPAIRMENTS Abnormal gait, decreased activity tolerance, difficulty walking, increased edema, increased muscle spasms, improper body mechanics, and pain.  ACTIVITY LIMITATIONS standing, squatting, and locomotion level  PARTICIPATION LIMITATIONS: meal prep, cleaning, and community activity  PERSONAL FACTORS  PLMS, h/o knee surgery  are also affecting patient's functional outcome.   REHAB POTENTIAL: Good  CLINICAL DECISION MAKING: Stable/uncomplicated  EVALUATION COMPLEXITY: Low   GOALS: Goals reviewed with patient? Yes  SHORT TERM GOALS: Target date: 05/29/22 Will trial DF support at night to determine if this helps Baseline: reports it does seem to help Goal status:achieved   LONG TERM GOALS: Target date: 06/13/22  Able to ambulate without  pain Baseline:  Goal status: INITIAL  2.  Resolution of "charlie horse" sensation Baseline:  Goal status: INITIAL  3.  Decreased tension of lower leg Baseline: very tight and hardened at eval Goal status: INITIAL     PLAN: PT FREQUENCY: 2x/week  PT DURATION: 4 weeks  PLANNED INTERVENTIONS: Therapeutic exercises, Therapeutic activity, Neuromuscular re-education, Balance training, Gait training, Patient/Family education, Joint mobilization, Dry Needling, Cryotherapy, Moist heat, Taping, Ionotophoresis 78m/ml Dexamethasone, and Manual therapy  PLAN FOR NEXT SESSION: continue eccentric lengthening and DN PRN  Emersyn Kotarski C. Dohn Stclair PT, DPT 06/01/22 8:42 PM

## 2022-06-05 ENCOUNTER — Ambulatory Visit (HOSPITAL_BASED_OUTPATIENT_CLINIC_OR_DEPARTMENT_OTHER): Payer: 59 | Admitting: Orthopaedic Surgery

## 2022-06-09 ENCOUNTER — Encounter (HOSPITAL_BASED_OUTPATIENT_CLINIC_OR_DEPARTMENT_OTHER): Payer: 59 | Admitting: Physical Therapy

## 2022-06-15 ENCOUNTER — Ambulatory Visit (HOSPITAL_BASED_OUTPATIENT_CLINIC_OR_DEPARTMENT_OTHER): Payer: 59 | Admitting: Physical Therapy

## 2022-06-17 ENCOUNTER — Telehealth: Payer: Self-pay | Admitting: *Deleted

## 2022-06-17 ENCOUNTER — Other Ambulatory Visit: Payer: Self-pay | Admitting: Emergency Medicine

## 2022-06-17 ENCOUNTER — Other Ambulatory Visit (HOSPITAL_COMMUNITY): Payer: Self-pay

## 2022-06-17 ENCOUNTER — Encounter (INDEPENDENT_AMBULATORY_CARE_PROVIDER_SITE_OTHER): Payer: Self-pay

## 2022-06-17 DIAGNOSIS — E8881 Metabolic syndrome: Secondary | ICD-10-CM

## 2022-06-17 MED ORDER — OZEMPIC (0.25 OR 0.5 MG/DOSE) 2 MG/3ML ~~LOC~~ SOPN
0.5000 mg | PEN_INJECTOR | SUBCUTANEOUS | 7 refills | Status: DC
  Filled 2022-06-17 (×2): qty 3, 28d supply, fill #0

## 2022-06-17 NOTE — Telephone Encounter (Signed)
PA for Ozempic submitted, awaiting response Key: [id number]

## 2022-06-22 ENCOUNTER — Other Ambulatory Visit: Payer: Self-pay | Admitting: Emergency Medicine

## 2022-06-22 MED ORDER — METRONIDAZOLE 500 MG PO TABS: 500.0000 mg | ORAL_TABLET | Freq: Two times a day (BID) | ORAL | 0 refills | Status: AC

## 2022-06-24 ENCOUNTER — Other Ambulatory Visit (HOSPITAL_COMMUNITY): Payer: Self-pay

## 2022-06-24 ENCOUNTER — Other Ambulatory Visit: Payer: Self-pay | Admitting: Emergency Medicine

## 2022-06-24 DIAGNOSIS — E8881 Metabolic syndrome: Secondary | ICD-10-CM

## 2022-06-24 MED ORDER — WEGOVY 0.5 MG/0.5ML ~~LOC~~ SOAJ
0.5000 mg | SUBCUTANEOUS | 10 refills | Status: DC
Start: 2022-06-24 — End: 2022-11-13
  Filled 2022-06-24 – 2022-07-28 (×5): qty 2, 28d supply, fill #0
  Filled 2022-08-26: qty 2, 28d supply, fill #1

## 2022-06-26 ENCOUNTER — Ambulatory Visit (HOSPITAL_BASED_OUTPATIENT_CLINIC_OR_DEPARTMENT_OTHER): Payer: 59 | Attending: Orthopaedic Surgery | Admitting: Physical Therapy

## 2022-06-26 ENCOUNTER — Encounter (HOSPITAL_BASED_OUTPATIENT_CLINIC_OR_DEPARTMENT_OTHER): Payer: Self-pay | Admitting: Physical Therapy

## 2022-06-26 ENCOUNTER — Other Ambulatory Visit: Payer: Self-pay | Admitting: Emergency Medicine

## 2022-06-26 DIAGNOSIS — M79662 Pain in left lower leg: Secondary | ICD-10-CM | POA: Insufficient documentation

## 2022-06-26 DIAGNOSIS — M25562 Pain in left knee: Secondary | ICD-10-CM | POA: Insufficient documentation

## 2022-06-26 DIAGNOSIS — B3731 Acute candidiasis of vulva and vagina: Secondary | ICD-10-CM

## 2022-06-26 DIAGNOSIS — R262 Difficulty in walking, not elsewhere classified: Secondary | ICD-10-CM | POA: Diagnosis not present

## 2022-06-26 MED ORDER — FLUCONAZOLE 150 MG PO TABS: 150.0000 mg | ORAL_TABLET | Freq: Once | ORAL | 0 refills | Status: AC

## 2022-06-26 NOTE — Therapy (Signed)
OUTPATIENT PHYSICAL THERAPY LOWER EXTREMITY Treatment   Patient Name: Patricia Mathews MRN: [id number] DOB:October 23, 1961, 61 y.o., female Today's Date: 06/26/2022   PT End of Session - 06/26/22 1102     Visit Number 5    Number of Visits 8    Date for PT Re-Evaluation 07/24/22    Authorization Type [facility]    PT Start Time 1103    PT Stop Time 1145    PT Time Calculation (min) 42 min    Activity Tolerance Patient tolerated treatment well    Behavior During Therapy WFL for tasks assessed/performed              Past Medical History:  Diagnosis Date   Dyspnea    Dysrhythmia    Endometrial polyp    Family history of adverse reaction to anesthesia    Patient states her mother had an adverse reaction to anesthesia but she is not sure exactly what it was   Fibromyalgia    GAD (generalized anxiety disorder)    History of panic attacks    Hypertension    followed by pcp   Medical history non-contributory    Neuropathy    OSA on CPAP    followed by dr dohmeier--- per study 02-02-2018 moderate to severe osa   Right bundle branch block    Sjogren's disease ([place])    Systemic lupus ([place])    rheumonotologist-- dr Page Spiro   Past Surgical History:  Procedure Laterality Date   [place];  1989   CHONDROPLASTY Left 12/09/2021   Procedure: LEFT KNEE MEDIAL ROOT REPAIR, FEMORAL CHONDROPLASTY;  Surgeon: Vanetta Mulders, MD;  Location: [place];  Service: Orthopedics;  Laterality: Left;  general with regional block   COLONOSCOPY WITH PROPOFOL  10/2012   DILATATION & CURETTAGE/HYSTEROSCOPY WITH MYOSURE N/A 11/05/2020   Procedure: [place];  Surgeon: Princess Bruins, MD;  Location: [place];  Service: Gynecology;  Laterality: N/A;  request to follow in [place] block time ~10:00am requests one hour   FIDUCIAL MARKER PLACEMENT  09/11/2021   Procedure: FIDUCIAL DYE MARKING;  Surgeon: Garner Nash, DO;  Location: [place] [facility];  Service: Pulmonary;;   INTERCOSTAL NERVE BLOCK Right 03/26/2021   Procedure: INTERCOSTAL NERVE BLOCK;  Surgeon: Lajuana Matte, MD;  Location: [place];  Service: Thoracic;  Laterality: Right;   INTERCOSTAL NERVE BLOCK Right 09/11/2021   Procedure: INTERCOSTAL NERVE BLOCK;  Surgeon: Lajuana Matte, MD;  Location: [place];  Service: Thoracic;  Laterality: Right;   LYMPH NODE DISSECTION  03/26/2021   Procedure: LYMPH NODE DISSECTION;  Surgeon: Lajuana Matte, MD;  Location: [place];  Service: Thoracic;;   LYMPH NODE DISSECTION Right 09/11/2021   Procedure: LYMPH NODE DISSECTION;  Surgeon: Lajuana Matte, MD;  Location: [place];  Service: Thoracic;  Laterality: Right;   thorascopy Right    Robotic Assisted   VIDEO BRONCHOSCOPY WITH ENDOBRONCHIAL NAVIGATION N/A 09/11/2021   Procedure: VIDEO BRONCHOSCOPY WITH ENDOBRONCHIAL NAVIGATION;  Surgeon: Garner Nash, DO;  Location: [place];  Service: Pulmonary;  Laterality: N/A;   Patient Active Problem List   Diagnosis Date Noted   Acute medial meniscus tear of right knee    Plica of knee, left    Dyslipidemia 10/24/2021   Hypertension 10/24/2021   Neuropathic pain 10/24/2021   S/P partial lobectomy of lung 09/11/2021   Carcinoid tumor    Malignant carcinoid tumor of the bronchus and lung (  [place]) 04/18/2021   Drug induced constipation 04/18/2021   Right lower lobe pulmonary nodule 03/26/2021   Essential hypertension, benign 12/25/2019   Other forms of systemic lupus erythematosus ([place]) 12/25/2019   Sjogren's syndrome ([place]) 12/25/2019   GAD (generalized anxiety disorder) 12/25/2019   Obstructive sleep apnea treated with continuous positive airway pressure (CPAP) 09/01/2018   Sleep related headaches 01/03/2018   Snoring 01/03/2018   Hypersomnia with sleep apnea 01/03/2018   Arthralgia 01/03/2018   Multiple neurological symptoms 12/28/2017    PCP: Agustina Caroli, MD  REFERRING  PROVIDER: Vanetta Mulders, MD  REFERRING DIAG: [phone] (ICD-10-CM) - Tenderness of left calf Program for L calf/gastroc tightness // negative for DVT  THERAPY DIAG:  Pain in left lower leg  Difficulty in walking, not elsewhere classified  Rationale for Evaluation and Treatment Rehabilitation  ONSET DATE: just about 3 weeks ago (mid June)  SUBJECTIVE:   SUBJECTIVE STATEMENT: I was sick after injection so I was unable to try my walk again. Back of the leg is not hurting at all. The pain is in the front of my knee like on stairs but not all the time. I do the seated elliptical for about an hour.    PERTINENT HISTORY: Knee surgery 6 mo ago, fibromyalgia, neuropathy, periodic limb movement disorder  PAIN:  Are you having pain? Yes: NPRS scale: moderate/10 Pain location: Lt lateral gastroc Pain description: tight Aggravating factors: walking/stretching Relieving factors: massage  PRECAUTIONS: ICD/Pacemaker  WEIGHT BEARING RESTRICTIONS No  FALLS:  Has patient fallen in last 6 months? No  LIVING ENVIRONMENT: Lives with: lives with their family Lives in: House/apartment   OCCUPATION: not working  PLOF: Independent  PATIENT GOALS decrease pain   OBJECTIVE:   DIAGNOSTIC FINDINGS: negative for DVT  EDEMA:  No pitting edema  MUSCLE LENGTH: Good length of gastroc into DF but notable tightness in Lt lateral muscle belly- allowing 10+ degrees of DF  PALPATION: Palpated trigger points recreated concordant pain  GAIT: Distance walked: within clinic Assistive device utilized: None Level of assistance: Complete Independence Comments: presents with heel toe strike but lateral calcaneus to 1st met rather than midline weight progression. Pt also notes some pain in Rt heel with wearing flat shoes.     TODAY'S TREATMENT: 8/18:  MANUAL: patellar inf mobs with fat pad mobility LAQ with later to medial patellar glide by PT Sidelying hip flexion/ext Sidelying hip  circles, arcs 2" Lt lateral step downs- mirror with tactile cues Squat to chair tap, blue tband around knee Mcconnell tape placed end of session for lateral to medial patellar tracking.    PATIENT EDUCATION:  Education details: Geophysicist/field seismologist of condition, POC, HEP, exercise form/rationale  Person educated: Patient and Spouse Education method: Explanation, Demonstration, Tactile cues, Verbal cues, and Handouts Education comprehension: verbalized understanding, returned demonstration, verbal cues required, tactile cues required, and needs further education   HOME EXERCISE PROGRAM: [id number]  Will trial elevation, compression stocking & DF support at night  ASSESSMENT:  CLINICAL IMPRESSION: Reduced fat pad protrusion with applied lateral to medial pressure in patella. 4/5 hip strength in abd allowing for valgus collapse and poor tracking. Will require further training of squat form and stair navigation.    OBJECTIVE IMPAIRMENTS Abnormal gait, decreased activity tolerance, difficulty walking, increased edema, increased muscle spasms, improper body mechanics, and pain.   ACTIVITY LIMITATIONS standing, squatting, and locomotion level  PARTICIPATION LIMITATIONS: meal prep, cleaning, and community activity  PERSONAL FACTORS  PLMS, h/o knee surgery  are also affecting patient's functional  outcome.   REHAB POTENTIAL: Good  CLINICAL DECISION MAKING: Stable/uncomplicated  EVALUATION COMPLEXITY: Low   GOALS: Goals reviewed with patient? Yes  SHORT TERM GOALS: Target date: 05/29/22 Will trial DF support at night to determine if this helps Baseline: reports it does seem to help Goal status:achieved   LONG TERM GOALS: Target date: 06/13/22  Able to ambulate without pain Baseline: pain every few days  Goal status: ongoing  2.  Resolution of "charlie horse" sensation Baseline: feels this when she tried to stretch, discussed stretching slower/more gentle Goal status:ongoing  3.   Decreased tension of lower leg Goal status: achieved  4.  Able to navigate stairs without knee pain Baseline:  Goal status: INITIAL      PLAN: PT FREQUENCY: 2x/week  PT DURATION: 4 weeks  PLANNED INTERVENTIONS: Therapeutic exercises, Therapeutic activity, Neuromuscular re-education, Balance training, Gait training, Patient/Family education, Joint mobilization, Dry Needling, Cryotherapy, Moist heat, Taping, Ionotophoresis 66m/ml Dexamethasone, and Manual therapy  PLAN FOR NEXT SESSION: outcome of tape??  Sevilla Murtagh C. Blayke Pinera PT, DPT 06/26/22 11:56 AM

## 2022-07-02 ENCOUNTER — Other Ambulatory Visit (HOSPITAL_COMMUNITY): Payer: Self-pay

## 2022-07-03 ENCOUNTER — Ambulatory Visit (INDEPENDENT_AMBULATORY_CARE_PROVIDER_SITE_OTHER): Payer: 59 | Admitting: Orthopaedic Surgery

## 2022-07-03 ENCOUNTER — Ambulatory Visit (HOSPITAL_BASED_OUTPATIENT_CLINIC_OR_DEPARTMENT_OTHER): Payer: 59 | Admitting: Physical Therapy

## 2022-07-03 ENCOUNTER — Other Ambulatory Visit (HOSPITAL_COMMUNITY): Payer: Self-pay

## 2022-07-03 ENCOUNTER — Encounter (HOSPITAL_BASED_OUTPATIENT_CLINIC_OR_DEPARTMENT_OTHER): Payer: Self-pay | Admitting: Physical Therapy

## 2022-07-03 DIAGNOSIS — M25562 Pain in left knee: Secondary | ICD-10-CM | POA: Diagnosis not present

## 2022-07-03 DIAGNOSIS — R262 Difficulty in walking, not elsewhere classified: Secondary | ICD-10-CM

## 2022-07-03 DIAGNOSIS — M79662 Pain in left lower leg: Secondary | ICD-10-CM

## 2022-07-03 DIAGNOSIS — G8929 Other chronic pain: Secondary | ICD-10-CM

## 2022-07-03 NOTE — Therapy (Signed)
OUTPATIENT PHYSICAL THERAPY LOWER EXTREMITY Treatment   Patient Name: Patricia Mathews MRN: [id number] DOB:09-09-1961, 61 y.o., female Today's Date: 07/03/2022   PT End of Session - 07/03/22 1321     Visit Number 6    Number of Visits 8    Date for PT Re-Evaluation 07/24/22    Authorization Type [facility] U[facility]    PT Start Time 1320    PT Stop Time 1350    PT Time Calculation (min) 30 min    Activity Tolerance Patient tolerated treatment well    Behavior During Therapy WFL for tasks assessed/performed              Past Medical History:  Diagnosis Date   Dyspnea    Dysrhythmia    Endometrial polyp    Family history of adverse reaction to anesthesia    Patient states her mother had an adverse reaction to anesthesia but she is not sure exactly what it was   Fibromyalgia    GAD (generalized anxiety disorder)    History of panic attacks    Hypertension    followed by pcp   Medical history non-contributory    Neuropathy    OSA on CPAP    followed by dr dohmeier--- per study 02-02-2018 moderate to severe osa   Right bundle branch block    Sjogren's disease ([place])    Systemic lupus ([place])    rheumonotologist-- dr Page Spiro   Past Surgical History:  Procedure Laterality Date   [place];  1989   CHONDROPLASTY Left 12/09/2021   Procedure: LEFT KNEE MEDIAL ROOT REPAIR, FEMORAL CHONDROPLASTY;  Surgeon: Vanetta Mulders, MD;  Location: [facility];  Service: Orthopedics;  Laterality: Left;  general with regional block   COLONOSCOPY WITH PROPOFOL  10/2012   DILATATION & CURETTAGE/HYSTEROSCOPY WITH MYOSURE N/A 11/05/2020   Procedure: [place];  Surgeon: Princess Bruins, MD;  Location: [facility];  Service: Gynecology;  Laterality: N/A;  request to follow in [place] block time ~10:00am requests one hour   FIDUCIAL MARKER PLACEMENT  09/11/2021   Procedure: FIDUCIAL DYE MARKING;  Surgeon: Garner Nash, DO;  Location: [facility];  Service: Pulmonary;;   INTERCOSTAL NERVE BLOCK Right 03/26/2021   Procedure: INTERCOSTAL NERVE BLOCK;  Surgeon: Lajuana Matte, MD;  Location: [place] [facility];  Service: Thoracic;  Laterality: Right;   INTERCOSTAL NERVE BLOCK Right 09/11/2021   Procedure: INTERCOSTAL NERVE BLOCK;  Surgeon: Lajuana Matte, MD;  Location: [facility];  Service: Thoracic;  Laterality: Right;   LYMPH NODE DISSECTION  03/26/2021   Procedure: LYMPH NODE DISSECTION;  Surgeon: Lajuana Matte, MD;  Location: [place];  Service: Thoracic;;   LYMPH NODE DISSECTION Right 09/11/2021   Procedure: LYMPH NODE DISSECTION;  Surgeon: Lajuana Matte, MD;  Location: [facility];  Service: Thoracic;  Laterality: Right;   thorascopy Right    Robotic Assisted   VIDEO BRONCHOSCOPY WITH ENDOBRONCHIAL NAVIGATION N/A 09/11/2021   Procedure: VIDEO BRONCHOSCOPY WITH ENDOBRONCHIAL NAVIGATION;  Surgeon: Garner Nash, DO;  Location: [facility];  Service: Pulmonary;  Laterality: N/A;   Patient Active Problem List   Diagnosis Date Noted   Acute medial meniscus tear of right knee    Plica of knee, left    Dyslipidemia 10/24/2021   Hypertension 10/24/2021   Neuropathic pain 10/24/2021   S/P partial lobectomy of lung 09/11/2021   Carcinoid tumor    Malignant carcinoid tumor of the bronchus and lung (  [place]) 04/18/2021   Drug induced constipation 04/18/2021   Right lower lobe pulmonary nodule 03/26/2021   Essential hypertension, benign 12/25/2019   Other forms of systemic lupus erythematosus ([place]) 12/25/2019   Sjogren's syndrome ([place]) 12/25/2019   GAD (generalized anxiety disorder) 12/25/2019   Obstructive sleep apnea treated with continuous positive airway pressure (CPAP) 09/01/2018   Sleep related headaches 01/03/2018   Snoring 01/03/2018   Hypersomnia with sleep apnea 01/03/2018   Arthralgia 01/03/2018   Multiple neurological symptoms 12/28/2017    PCP: Agustina Caroli, MD  REFERRING  PROVIDER: Vanetta Mulders, MD  REFERRING DIAG: [phone] (ICD-10-CM) - Tenderness of left calf Program for L calf/gastroc tightness // negative for DVT  THERAPY DIAG:  Pain in left lower leg  Difficulty in walking, not elsewhere classified  Acute pain of left knee  Rationale for Evaluation and Treatment Rehabilitation  ONSET DATE: just about 3 weeks ago (mid June)  SUBJECTIVE:   SUBJECTIVE STATEMENT: Tape was helpful, going down the stairs straight.    PERTINENT HISTORY: Knee surgery 6 mo ago, fibromyalgia, neuropathy, periodic limb movement disorder  PAIN:  Are you having pain? Yes: NPRS scale: 0/10 Pain location: Lt lateral gastroc Pain description: tight Aggravating factors: walking/stretching Relieving factors: massage  PRECAUTIONS: ICD/Pacemaker  WEIGHT BEARING RESTRICTIONS No  FALLS:  Has patient fallen in last 6 months? No  LIVING ENVIRONMENT: Lives with: lives with their family Lives in: House/apartment   OCCUPATION: not working  PLOF: Independent  PATIENT GOALS decrease pain   OBJECTIVE:   DIAGNOSTIC FINDINGS: negative for DVT  EDEMA:  No pitting edema  MUSCLE LENGTH: Good length of gastroc into DF but notable tightness in Lt lateral muscle belly- allowing 10+ degrees of DF  PALPATION: Palpated trigger points recreated concordant pain  GAIT: Distance walked: within clinic Assistive device utilized: None Level of assistance: Complete Independence Comments: presents with heel toe strike but lateral calcaneus to 1st met rather than midline weight progression. Pt also notes some pain in Rt heel with wearing flat shoes.     TODAY'S TREATMENT: 8/25:  Single leg step up- PT applied lateral to medial pressure which reduced pain Single leg stance with small bend Tandem with head turns LAQ 4lb with PT lateral to medial pressure Sidelying hip abd 4lb, abd+flexion 4lb    8/18:  MANUAL: patellar inf mobs with fat pad mobility LAQ with  later to medial patellar glide by PT Sidelying hip flexion/ext Sidelying hip circles, arcs 2" Lt lateral step downs- mirror with tactile cues Squat to chair tap, blue tband around knee Mcconnell tape placed end of session for lateral to medial patellar tracking.    PATIENT EDUCATION:  Education details: Geophysicist/field seismologist of condition, POC, HEP, exercise form/rationale  Person educated: Patient and Spouse Education method: Explanation, Demonstration, Tactile cues, Verbal cues, and Handouts Education comprehension: verbalized understanding, returned demonstration, verbal cues required, tactile cues required, and needs further education   HOME EXERCISE PROGRAM: [id number]  Will trial elevation, compression stocking & DF support at night  ASSESSMENT:  CLINICAL IMPRESSION: Continues to have fluid just lateral to patellar tendon but tracking is significantly improved. Noted discomfort when challenging step height but was fine after some guided tracking. Plans to progress into lower body workouts at the gym and asked her to try the first 10 min of her workout on standing elliptical and finish in seated for progressed challenge.    OBJECTIVE IMPAIRMENTS Abnormal gait, decreased activity tolerance, difficulty walking, increased edema, increased muscle spasms, improper body mechanics, and pain.  ACTIVITY LIMITATIONS standing, squatting, and locomotion level  PARTICIPATION LIMITATIONS: meal prep, cleaning, and community activity  PERSONAL FACTORS  PLMS, h/o knee surgery  are also affecting patient's functional outcome.   REHAB POTENTIAL: Good  CLINICAL DECISION MAKING: Stable/uncomplicated  EVALUATION COMPLEXITY: Low   GOALS: Goals reviewed with patient? Yes  SHORT TERM GOALS: Target date: 05/29/22 Will trial DF support at night to determine if this helps Baseline: reports it does seem to help Goal status:achieved   LONG TERM GOALS: Target date: end of session date  Able to ambulate  without pain Baseline: pain every few days  Goal status: ongoing  2.  Resolution of "charlie horse" sensation Baseline: feels this when she tried to stretch, discussed stretching slower/more gentle Goal status:ongoing  3.  Decreased tension of lower leg Goal status: achieved  4.  Able to navigate stairs without knee pain Baseline:  Goal status: INITIAL      PLAN: PT FREQUENCY: 2x/week  PT DURATION: 4 weeks  PLANNED INTERVENTIONS: Therapeutic exercises, Therapeutic activity, Neuromuscular re-education, Balance training, Gait training, Patient/Family education, Joint mobilization, Dry Needling, Cryotherapy, Moist heat, Taping, Ionotophoresis 28m/ml Dexamethasone, and Manual therapy  PLAN FOR NEXT SESSION: review stairs  Veera Stapleton C. Nishanth Mccaughan PT, DPT 07/03/22 2:02 PM

## 2022-07-03 NOTE — Progress Notes (Signed)
Post Operative Evaluation    Procedure/Date of Surgery: left knee medial root 1/31  Interval History:   Presents today for follow-up overall doing extremely well.  She will only occasionally have soreness when she has been on elliptical for 40 minutes.  Overall she is very happy with her recovery.   PMH/PSH/Family History/Social History/Meds/Allergies:    Past Medical History:  Diagnosis Date   Dyspnea    Dysrhythmia    Endometrial polyp    Family history of adverse reaction to anesthesia    Patient states her mother had an adverse reaction to anesthesia but she is not sure exactly what it was   Fibromyalgia    GAD (generalized anxiety disorder)    History of panic attacks    Hypertension    followed by pcp   Medical history non-contributory    Neuropathy    OSA on CPAP    followed by dr dohmeier--- per study 02-02-2018 moderate to severe osa   Right bundle branch block    Sjogren's disease ([place])    Systemic lupus ([place])    rheumonotologist-- dr Page Spiro   Past Surgical History:  Procedure Laterality Date   [place];  1989   CHONDROPLASTY Left 12/09/2021   Procedure: LEFT KNEE MEDIAL ROOT REPAIR, FEMORAL CHONDROPLASTY;  Surgeon: Vanetta Mulders, MD;  Location: [facility];  Service: Orthopedics;  Laterality: Left;  general with regional block   COLONOSCOPY WITH PROPOFOL  10/2012   DILATATION & CURETTAGE/HYSTEROSCOPY WITH MYOSURE N/A 11/05/2020   Procedure: [place];  Surgeon: Princess Bruins, MD;  Location: [facility];  Service: Gynecology;  Laterality: N/A;  request to follow in [place] block time ~10:00am requests one hour   FIDUCIAL MARKER PLACEMENT  09/11/2021   Procedure: FIDUCIAL DYE MARKING;  Surgeon: Garner Nash, DO;  Location: [facility];  Service: Pulmonary;;   INTERCOSTAL NERVE BLOCK Right 03/26/2021   Procedure: INTERCOSTAL  NERVE BLOCK;  Surgeon: Lajuana Matte, MD;  Location: [facility];  Service: Thoracic;  Laterality: Right;   INTERCOSTAL NERVE BLOCK Right 09/11/2021   Procedure: INTERCOSTAL NERVE BLOCK;  Surgeon: Lajuana Matte, MD;  Location: [facility];  Service: Thoracic;  Laterality: Right;   LYMPH NODE DISSECTION  03/26/2021   Procedure: LYMPH NODE DISSECTION;  Surgeon: Lajuana Matte, MD;  Location: [facility];  Service: Thoracic;;   LYMPH NODE DISSECTION Right 09/11/2021   Procedure: LYMPH NODE DISSECTION;  Surgeon: Lajuana Matte, MD;  Location: [facility];  Service: Thoracic;  Laterality: Right;   thorascopy Right    Robotic Assisted   VIDEO BRONCHOSCOPY WITH ENDOBRONCHIAL NAVIGATION N/A 09/11/2021   Procedure: VIDEO BRONCHOSCOPY WITH ENDOBRONCHIAL NAVIGATION;  Surgeon: Garner Nash, DO;  Location: [facility];  Service: Pulmonary;  Laterality: N/A;   Social History   Socioeconomic History   Marital status: Married    Spouse name: Not on file   Number of children: 2   Years of education: post-grad   Highest education level: Not on file  Occupational History   Not on file  Tobacco Use   Smoking status: Former    Years: 10.00    Types: Cigarettes    Quit date: 1989    Years since quitting: 34.6   Smokeless tobacco: Never  Vaping Use  Vaping Use: Never used  Substance and Sexual Activity   Alcohol use: Yes    Comment: occasional- social   Drug use: Never   Sexual activity: Yes    Partners: Male    Birth control/protection: Post-menopausal    Comment: 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56, PARTNERS- 3, MARRIED- 5 YRS   Other Topics Concern   Not on file  Social History Narrative   Lives at home with her husband   Right handed   2 cups of caffeine daily   Social Determinants of Health   Financial Resource Strain: Not on file  Food Insecurity: Not on file  Transportation Needs: Not on file  Physical Activity: Not on file  Stress: Not on file  Social Connections: Not on file   Family  History  Problem Relation Age of Onset   Hypertension Mother    Osteoarthritis Mother    Cancer Father        PROSTATE   Hypertension Father    Heart failure Father    Lung disease Father    Breast cancer Sister    Hypertension Sister    Lupus Sister    Sarcoidosis Paternal Aunt    Cancer Brother        PROSTATE   Lupus Brother    Hypertension Brother    Hypertension Brother    Hypertension Brother    Allergies  Allergen Reactions   Penicillins Anaphylaxis   Current Outpatient Medications  Medication Sig Dispense Refill   acetaminophen (TYLENOL) 500 MG tablet Take 1,000 mg by mouth every 6 (six) hours as needed for moderate pain or headache.     ALPRAZolam (XANAX) 0.25 MG tablet Take 1 tablet (0.25 mg total) by mouth 2 (two) times daily as needed for anxiety. 30 tablet 3   aspirin EC 325 MG tablet Take 1 tablet (325 mg total) by mouth daily. 30 tablet 0   Cyanocobalamin (B-12) 3000 MCG CAPS Take 3,000 mg by mouth daily.     DULoxetine (CYMBALTA) 30 MG capsule Take 1 capsule (30 mg total) by mouth 2 (two) times daily. 180 capsule 3   fluticasone (FLOVENT HFA) 110 MCG/ACT inhaler Inhale 2 puffs into the lungs daily. (Patient not taking: Reported on 05/01/2022) 12 g 3   HYDROcodone-acetaminophen (NORCO/VICODIN) 5-325 MG tablet Take 1 tablet by mouth every 4 (four) hours as needed for moderate pain. (Patient not taking: Reported on 05/01/2022) 30 tablet 0   linaclotide (LINZESS) 145 MCG CAPS capsule Take 1 capsule (145 mcg total) by mouth daily before breakfast. (Patient not taking: Reported on 05/01/2022) 30 capsule 2   losartan-hydrochlorothiazide (HYZAAR) 50-12.5 MG tablet Take 1 tablet by mouth daily. 90 tablet 3   MELATONIN PO Take 1 capsule by mouth at bedtime as needed (sleep).     METAMUCIL FIBER PO Take 1 Scoop by mouth daily as needed (constipation).     OVER THE COUNTER MEDICATION Take 2 tablets by mouth daily. Olly digestive enzyme blend     oxyCODONE (OXY IR/ROXICODONE) 5  MG immediate release tablet Take 1 tablet (5 mg total) by mouth every 4 (four) hours as needed (severe pain). (Patient not taking: Reported on 05/01/2022) 20 tablet 0   Respiratory Therapy Supplies (CARETOUCH 2 CPAP HOSE HANGER) MISC by Does not apply route.     rosuvastatin (CRESTOR) 10 MG tablet Take 1 tablet (10 mg total) by mouth daily. 90 tablet 3   Semaglutide-Weight Management (WEGOVY) 0.5 MG/0.5ML SOAJ Inject 0.5 mg into the skin once a week. 2 mL  10   No current facility-administered medications for this visit.   No results found.  Review of Systems:   A ROS was performed including pertinent positives and negatives as documented in the HPI.   Musculoskeletal Exam:    There were no vitals taken for this visit.  Incisions well-healed.  Range of motion is from 0 to 135 degrees without pain.  Improved tenderness around the Hoffa fat pad..  There is fullness about the left leg and swelling compared to the contralateral side.  Imaging:    None  I personally reviewed and interpreted the radiographs.   Assessment:   60 year old female status post left medial meniscal root repair doing well.  She is continuing to make very good progress with rehab.  At this time we will plan to see her back as needed Plan :    -Return to clinic as needed    I personally saw and evaluated the patient, and participated in the management and treatment plan.  Vanetta Mulders, MD Attending Physician, Orthopedic Surgery  This document was dictated using Dragon voice recognition software. A reasonable attempt at proof reading has been made to minimize errors.

## 2022-07-10 ENCOUNTER — Encounter (HOSPITAL_BASED_OUTPATIENT_CLINIC_OR_DEPARTMENT_OTHER): Payer: 59 | Admitting: Physical Therapy

## 2022-07-14 ENCOUNTER — Other Ambulatory Visit (HOSPITAL_COMMUNITY): Payer: Self-pay

## 2022-07-17 ENCOUNTER — Encounter (HOSPITAL_BASED_OUTPATIENT_CLINIC_OR_DEPARTMENT_OTHER): Payer: Self-pay | Admitting: Physical Therapy

## 2022-07-17 ENCOUNTER — Ambulatory Visit (HOSPITAL_BASED_OUTPATIENT_CLINIC_OR_DEPARTMENT_OTHER): Payer: 59 | Attending: Orthopaedic Surgery | Admitting: Physical Therapy

## 2022-07-17 DIAGNOSIS — M79662 Pain in left lower leg: Secondary | ICD-10-CM | POA: Diagnosis not present

## 2022-07-17 DIAGNOSIS — R262 Difficulty in walking, not elsewhere classified: Secondary | ICD-10-CM | POA: Diagnosis not present

## 2022-07-17 NOTE — Therapy (Signed)
OUTPATIENT PHYSICAL THERAPY LOWER EXTREMITY Treatment   Patient Name: Patricia Mathews MRN: [id number] DOB:[birth year], 61 y.o., female Today's Date: 07/17/2022   PT End of Session - 07/17/22 1316     Visit Number 7    Number of Visits 8    Date for PT Re-Evaluation 07/24/22    Authorization Type [facility] U[facility]    PT Start Time 1315    PT Stop Time 1337    PT Time Calculation (min) 22 min    Activity Tolerance Patient tolerated treatment well    Behavior During Therapy WFL for tasks assessed/performed              Past Medical History:  Diagnosis Date   Dyspnea    Dysrhythmia    Endometrial polyp    Family history of adverse reaction to anesthesia    Patient states her mother had an adverse reaction to anesthesia but she is not sure exactly what it was   Fibromyalgia    GAD (generalized anxiety disorder)    History of panic attacks    Hypertension    followed by pcp   Medical history non-contributory    Neuropathy    OSA on CPAP    followed by dr dohmeier--- per study 02-02-2018 moderate to severe osa   Right bundle branch block    Sjogren's disease ([place])    Systemic lupus ([place])    rheumonotologist-- dr Page Spiro   Past Surgical History:  Procedure Laterality Date   [place];  1989   CHONDROPLASTY Left 12/09/2021   Procedure: LEFT KNEE MEDIAL ROOT REPAIR, FEMORAL CHONDROPLASTY;  Surgeon: Vanetta Mulders, MD;  Location: [facility];  Service: Orthopedics;  Laterality: Left;  general with regional block   COLONOSCOPY WITH PROPOFOL  10/2012   DILATATION & CURETTAGE/HYSTEROSCOPY WITH MYOSURE N/A 11/05/2020   Procedure: [place];  Surgeon: Princess Bruins, MD;  Location: [facility];  Service: Gynecology;  Laterality: N/A;  request to follow in [place] block time ~10:00am requests one hour   FIDUCIAL MARKER PLACEMENT  09/11/2021   Procedure: FIDUCIAL DYE MARKING;  Surgeon: Garner Nash, DO;  Location: [facility];  Service: Pulmonary;;   INTERCOSTAL NERVE BLOCK Right 03/26/2021   Procedure: INTERCOSTAL NERVE BLOCK;  Surgeon: Lajuana Matte, MD;  Location: [facility];  Service: Thoracic;  Laterality: Right;   INTERCOSTAL NERVE BLOCK Right 09/11/2021   Procedure: INTERCOSTAL NERVE BLOCK;  Surgeon: Lajuana Matte, MD;  Location: [facility];  Service: Thoracic;  Laterality: Right;   LYMPH NODE DISSECTION  03/26/2021   Procedure: LYMPH NODE DISSECTION;  Surgeon: Lajuana Matte, MD;  Location: [facility];  Service: Thoracic;;   LYMPH NODE DISSECTION Right 09/11/2021   Procedure: LYMPH NODE DISSECTION;  Surgeon: Lajuana Matte, MD;  Location: [facility];  Service: Thoracic;  Laterality: Right;   thorascopy Right    Robotic Assisted   VIDEO BRONCHOSCOPY WITH ENDOBRONCHIAL NAVIGATION N/A 09/11/2021   Procedure: VIDEO BRONCHOSCOPY WITH ENDOBRONCHIAL NAVIGATION;  Surgeon: Garner Nash, DO;  Location: [facility];  Service: Pulmonary;  Laterality: N/A;   Patient Active Problem List   Diagnosis Date Noted   Acute medial meniscus tear of right knee    Plica of knee, left    Dyslipidemia 10/24/2021   Hypertension 10/24/2021   Neuropathic pain 10/24/2021   S/P partial lobectomy of lung 09/11/2021   Carcinoid tumor    Malignant carcinoid tumor of the bronchus and lung (  [place]) 04/18/2021   Drug induced constipation 04/18/2021   Right lower lobe pulmonary nodule 03/26/2021   Essential hypertension, benign 12/25/2019   Other forms of systemic lupus erythematosus ([place]) 12/25/2019   Sjogren's syndrome ([place]) 12/25/2019   GAD (generalized anxiety disorder) 12/25/2019   Obstructive sleep apnea treated with continuous positive airway pressure (CPAP) 09/01/2018   Sleep related headaches 01/03/2018   Snoring 01/03/2018   Hypersomnia with sleep apnea 01/03/2018   Arthralgia 01/03/2018   Multiple neurological symptoms 12/28/2017    PCP: Agustina Caroli, MD  REFERRING  PROVIDER: Vanetta Mulders, MD  REFERRING DIAG: [phone] (ICD-10-CM) - Tenderness of left calf Program for L calf/gastroc tightness // negative for DVT  THERAPY DIAG:  Pain in left lower leg  Difficulty in walking, not elsewhere classified  Rationale for Evaluation and Treatment Rehabilitation  ONSET DATE: just about 3 weeks ago (mid June)  SUBJECTIVE:   SUBJECTIVE STATEMENT: I have no pain whatsoever. I have been doing weights at the gym. Was able to do the stairs at my daughters house without issues.    PERTINENT HISTORY: Knee surgery 6 mo ago, fibromyalgia, neuropathy, periodic limb movement disorder  PAIN:  Are you having pain? Yes: NPRS scale: 0/10 Pain location: Lt lateral gastroc Pain description: tight Aggravating factors: walking/stretching Relieving factors: massage  PRECAUTIONS: ICD/Pacemaker  WEIGHT BEARING RESTRICTIONS No  FALLS:  Has patient fallen in last 6 months? No  LIVING ENVIRONMENT: Lives with: lives with their family Lives in: House/apartment   OCCUPATION: not working  PLOF: Independent  PATIENT GOALS decrease pain   OBJECTIVE:   DIAGNOSTIC FINDINGS: negative for DVT  EDEMA:  No pitting edema  MUSCLE LENGTH: Good length of gastroc into DF but notable tightness in Lt lateral muscle belly- allowing 10+ degrees of DF  PALPATION: Denied TTP  GAIT: WFL    TODAY'S TREATMENT: 8/25:  Single leg step up- PT applied lateral to medial pressure which reduced pain Single leg stance with small bend Tandem with head turns LAQ 4lb with PT lateral to medial pressure Sidelying hip abd 4lb, abd+flexion 4lb    8/18:  MANUAL: patellar inf mobs with fat pad mobility LAQ with later to medial patellar glide by PT Sidelying hip flexion/ext Sidelying hip circles, arcs 2" Lt lateral step downs- mirror with tactile cues Squat to chair tap, blue tband around knee Mcconnell tape placed end of session for lateral to medial patellar  tracking.    PATIENT EDUCATION:  Education details: Geophysicist/field seismologist of condition, POC, HEP, exercise form/rationale  Person educated: Patient and Spouse Education method: Explanation, Demonstration, Tactile cues, Verbal cues, and Handouts Education comprehension: verbalized understanding, returned demonstration, verbal cues required, tactile cues required, and needs further education   HOME EXERCISE PROGRAM: [id number]  Will trial elevation, compression stocking & DF support at night  ASSESSMENT:  CLINICAL IMPRESSION: Pt has met all of her goals at this time and is prepared for d/c to independent program. Still hesitant with stairs so we reviewed control exercises to reduce uncontrolled descent. Encouraged her to contact me with any further questions.     OBJECTIVE IMPAIRMENTS Abnormal gait, decreased activity tolerance, difficulty walking, increased edema, increased muscle spasms, improper body mechanics, and pain.   ACTIVITY LIMITATIONS standing, squatting, and locomotion level  PARTICIPATION LIMITATIONS: meal prep, cleaning, and community activity  PERSONAL FACTORS  PLMS, h/o knee surgery  are also affecting patient's functional outcome.   REHAB POTENTIAL: Good  CLINICAL DECISION MAKING: Stable/uncomplicated  EVALUATION COMPLEXITY: Low   GOALS: Goals reviewed with  patient? Yes  SHORT TERM GOALS: Target date: 05/29/22 Will trial DF support at night to determine if this helps Baseline: reports it does seem to help Goal status:achieved   LONG TERM GOALS: Target date: end of session date  Able to ambulate without pain Baseline: pain every few days  Goal status: achieved  2.  Resolution of "charlie horse" sensation Baseline: feels this when she tried to stretch, discussed stretching slower/more gentle Goal status:achieved  3.  Decreased tension of lower leg Goal status: achieved  4.  Able to navigate stairs without knee pain Baseline:  Goal  status:achieved      PLAN: PT FREQUENCY: 2x/week  PT DURATION: 4 weeks  PLANNED INTERVENTIONS: Therapeutic exercises, Therapeutic activity, Neuromuscular re-education, Balance training, Gait training, Patient/Family education, Joint mobilization, Dry Needling, Cryotherapy, Moist heat, Taping, Ionotophoresis 51m/ml Dexamethasone, and Manual therapy  PLAN FOR NEXT SESSION: review stairs  PHYSICAL THERAPY DISCHARGE SUMMARY  Visits from Start of Care: 7  Current functional level related to goals / functional outcomes: See above   Remaining deficits: See above   Education / Equipment: Anatomy of condition, POC, HEP, exercise form/rationale    Patient agrees to discharge. Patient goals were met. Patient is being discharged due to meeting the stated rehab goals.   Janyce Ellinger C. Saliou Barnier PT, DPT 07/17/22 1:45 PM

## 2022-07-28 ENCOUNTER — Other Ambulatory Visit (HOSPITAL_COMMUNITY): Payer: Self-pay

## 2022-08-04 ENCOUNTER — Other Ambulatory Visit (HOSPITAL_COMMUNITY): Payer: Self-pay

## 2022-09-01 ENCOUNTER — Other Ambulatory Visit (HOSPITAL_COMMUNITY): Payer: Self-pay

## 2022-09-01 ENCOUNTER — Other Ambulatory Visit: Payer: Self-pay | Admitting: Emergency Medicine

## 2022-09-01 MED ORDER — WEGOVY 1.7 MG/0.75ML ~~LOC~~ SOAJ
1.7000 mg | SUBCUTANEOUS | 10 refills | Status: DC
  Filled 2022-09-01: qty 3, 28d supply, fill #0
  Filled 2022-09-27: qty 3, 28d supply, fill #1
  Filled 2022-10-26: qty 3, 28d supply, fill #2

## 2022-09-25 ENCOUNTER — Other Ambulatory Visit: Payer: Self-pay | Admitting: Thoracic Surgery (Cardiothoracic Vascular Surgery)

## 2022-09-25 DIAGNOSIS — D3A090 Benign carcinoid tumor of the bronchus and lung: Secondary | ICD-10-CM

## 2022-09-28 ENCOUNTER — Other Ambulatory Visit (HOSPITAL_COMMUNITY): Payer: Self-pay

## 2022-09-29 ENCOUNTER — Encounter (HOSPITAL_COMMUNITY): Payer: Self-pay

## 2022-09-29 ENCOUNTER — Other Ambulatory Visit (HOSPITAL_COMMUNITY): Payer: Self-pay

## 2022-10-16 ENCOUNTER — Other Ambulatory Visit: Payer: Self-pay | Admitting: Emergency Medicine

## 2022-10-16 MED ORDER — ONDANSETRON 4 MG PO TBDP
4.0000 mg | ORAL_TABLET | Freq: Three times a day (TID) | ORAL | 5 refills | Status: DC | PRN
Start: 2022-10-16 — End: 2023-03-01

## 2022-10-26 ENCOUNTER — Other Ambulatory Visit: Payer: Self-pay

## 2022-10-27 ENCOUNTER — Other Ambulatory Visit (HOSPITAL_COMMUNITY): Payer: Self-pay

## 2022-10-28 ENCOUNTER — Other Ambulatory Visit (HOSPITAL_COMMUNITY): Payer: Self-pay

## 2022-10-30 ENCOUNTER — Other Ambulatory Visit: Payer: Self-pay | Admitting: Emergency Medicine

## 2022-10-30 DIAGNOSIS — E8881 Metabolic syndrome: Secondary | ICD-10-CM | POA: Diagnosis not present

## 2022-10-30 DIAGNOSIS — I10 Essential (primary) hypertension: Secondary | ICD-10-CM | POA: Diagnosis not present

## 2022-10-30 DIAGNOSIS — E785 Hyperlipidemia, unspecified: Secondary | ICD-10-CM | POA: Diagnosis not present

## 2022-10-31 LAB — COMPREHENSIVE METABOLIC PANEL
ALT: 34 IU/L — ABNORMAL HIGH (ref 0–32)
AST: 19 IU/L (ref 0–40)
Albumin/Globulin Ratio: 1.6 (ref 1.2–2.2)
Albumin: 4.6 g/dL (ref 3.9–4.9)
Alkaline Phosphatase: 96 IU/L (ref 44–121)
BUN/Creatinine Ratio: 16 (ref 12–28)
BUN: 14 mg/dL (ref 8–27)
Bilirubin Total: 0.6 mg/dL (ref 0.0–1.2)
CO2: 23 mmol/L (ref 20–29)
Calcium: 9.6 mg/dL (ref 8.7–10.3)
Chloride: 98 mmol/L (ref 96–106)
Creatinine, Ser: 0.89 mg/dL (ref 0.57–1.00)
Globulin, Total: 2.9 g/dL (ref 1.5–4.5)
Glucose: 92 mg/dL (ref 70–99)
Potassium: 4.1 mmol/L (ref 3.5–5.2)
Sodium: 139 mmol/L (ref 134–144)
Total Protein: 7.5 g/dL (ref 6.0–8.5)
eGFR: 73 mL/min/{1.73_m2} (ref >59)

## 2022-10-31 LAB — SPECIMEN STATUS REPORT

## 2022-10-31 LAB — LIPID PANEL W/O CHOL/HDL RATIO
Cholesterol, Total: 153 mg/dL (ref 100–199)
HDL: 84 mg/dL (ref >39)
LDL Chol Calc (NIH): 52 mg/dL (ref 0–99)
Triglycerides: 97 mg/dL (ref 0–149)
VLDL Cholesterol Cal: 17 mg/dL (ref 5–40)

## 2022-10-31 LAB — CBC WITH DIFFERENTIAL/PLATELET
Basophils Absolute: 0 10*3/uL (ref 0.0–0.2)
Basos: 1 %
EOS (ABSOLUTE): 0.2 10*3/uL (ref 0.0–0.4)
Eos: 2 %
Hematocrit: 40.3 % (ref 34.0–46.6)
Hemoglobin: 13.2 g/dL (ref 11.1–15.9)
Immature Grans (Abs): 0 10*3/uL (ref 0.0–0.1)
Immature Granulocytes: 0 %
Lymphocytes Absolute: 1.8 10*3/uL (ref 0.7–3.1)
Lymphs: 27 %
MCH: 30.6 pg (ref 26.6–33.0)
MCHC: 32.8 g/dL (ref 31.5–35.7)
MCV: 93 fL (ref 79–97)
Monocytes Absolute: 0.5 10*3/uL (ref 0.1–0.9)
Monocytes: 7 %
Neutrophils Absolute: 4.3 10*3/uL (ref 1.4–7.0)
Neutrophils: 63 %
Platelets: 361 10*3/uL (ref 150–450)
RBC: 4.32 x10E6/uL (ref 3.77–5.28)
RDW: 12.7 % (ref 11.7–15.4)
WBC: 6.8 10*3/uL (ref 3.4–10.8)

## 2022-10-31 LAB — HGB A1C W/O EAG: Hgb A1c MFr Bld: 5.7 % — ABNORMAL HIGH (ref 4.8–5.6)

## 2022-10-31 LAB — VITAMIN D 25 HYDROXY (VIT D DEFICIENCY, FRACTURES): Vit D, 25-Hydroxy: 9.2 ng/mL — ABNORMAL LOW (ref 30.0–100.0)

## 2022-11-06 ENCOUNTER — Other Ambulatory Visit (HOSPITAL_COMMUNITY): Payer: Self-pay

## 2022-11-06 ENCOUNTER — Ambulatory Visit
Admission: RE | Admit: 2022-11-06 | Discharge: 2022-11-06 | Disposition: A | Discharge status: Home / Self Care | Payer: 59 | Source: Ambulatory Visit | Attending: Thoracic Surgery (Cardiothoracic Vascular Surgery) | Admitting: Thoracic Surgery (Cardiothoracic Vascular Surgery)

## 2022-11-06 ENCOUNTER — Ambulatory Visit: Payer: 59 | Admitting: Thoracic Surgery (Cardiothoracic Vascular Surgery)

## 2022-11-06 DIAGNOSIS — D3A090 Benign carcinoid tumor of the bronchus and lung: Secondary | ICD-10-CM

## 2022-11-06 DIAGNOSIS — G4733 Obstructive sleep apnea (adult) (pediatric): Secondary | ICD-10-CM | POA: Diagnosis not present

## 2022-11-12 ENCOUNTER — Other Ambulatory Visit (HOSPITAL_COMMUNITY): Payer: Self-pay

## 2022-11-13 ENCOUNTER — Encounter: Payer: Self-pay | Admitting: Thoracic Surgery (Cardiothoracic Vascular Surgery)

## 2022-11-13 ENCOUNTER — Ambulatory Visit: Payer: Commercial Managed Care - PPO | Admitting: Thoracic Surgery (Cardiothoracic Vascular Surgery)

## 2022-11-13 VITALS — BP 125/80 | HR 103 | Resp 20 | Ht 65.0 in | Wt 195.0 lb

## 2022-11-13 DIAGNOSIS — Z902 Acquired absence of lung [part of]: Secondary | ICD-10-CM | POA: Diagnosis not present

## 2022-11-13 NOTE — Progress Notes (Signed)
      [address]       [place] [address]             [phone]        Patricia Mathews [place] Medical Record #[id number] Date of Birth: 08-05-1961  Referring: Horald Pollen, * Primary Care: Horald Pollen, MD Primary Cardiologist:Traci Radford Pax, MD  Reason for visit:   follow-up  History of Present Illness:     Patricia Mathews presents for 66-month follow-up.  Overall she is doing well.  She did have a repair surgery, and has lost over 35 pounds since recovering from this.  Her grandchildren are doing well, both live in [place].  Physical Exam: BP 125/80 (BP Location: Left Arm, Patient Position: Sitting, Cuff Size: Normal)   Pulse (!) 103   Resp 20   Ht 5\' 5"  (1.651 m)   Wt 195 lb (88.5 kg)   SpO2 96% Comment: RA  BMI 32.45 kg/m   Alert NAD EWOB tachy   Diagnostic Studies & Laboratory data: CT chest:   IMPRESSION: Postoperative scarring in the right lower lobe. No evidence of recurrent or metastatic disease.  Assessment / Plan:   62 year old female status post wedge resection from 2 separate occasions for carcinoid tumor, and carcinoid tumorlet.  Overall she is doing well she will follow-up in 1 year with another chest CT.   Lajuana Matte 11/13/2022 12:46 PM

## 2022-11-18 ENCOUNTER — Other Ambulatory Visit: Payer: Self-pay | Admitting: Emergency Medicine

## 2022-11-18 ENCOUNTER — Other Ambulatory Visit (HOSPITAL_COMMUNITY): Payer: Self-pay

## 2022-11-18 MED ORDER — WEGOVY 2.4 MG/0.75ML ~~LOC~~ SOAJ
2.4000 mg | SUBCUTANEOUS | 11 refills | Status: DC
  Filled 2022-11-18: qty 3, 28d supply, fill #0
  Filled 2022-12-09: qty 3, 28d supply, fill #1
  Filled 2023-01-05: qty 3, 28d supply, fill #2
  Filled 2023-01-28: qty 3, 28d supply, fill #3
  Filled 2023-02-24: qty 3, 28d supply, fill #4

## 2022-11-23 ENCOUNTER — Other Ambulatory Visit (HOSPITAL_COMMUNITY): Payer: Self-pay

## 2022-11-23 ENCOUNTER — Other Ambulatory Visit: Payer: Self-pay | Admitting: Emergency Medicine

## 2022-11-23 ENCOUNTER — Other Ambulatory Visit: Payer: Self-pay

## 2022-11-23 DIAGNOSIS — I10 Essential (primary) hypertension: Secondary | ICD-10-CM

## 2022-11-23 MED ORDER — ROSUVASTATIN CALCIUM 10 MG PO TABS
10.0000 mg | ORAL_TABLET | Freq: Every day | ORAL | 3 refills | Status: DC
  Filled 2022-11-23: qty 90, 90d supply, fill #0
  Filled 2023-02-03: qty 90, 90d supply, fill #1
  Filled 2023-05-26: qty 90, 90d supply, fill #2
  Filled 2023-08-20: qty 90, 90d supply, fill #3

## 2022-11-23 MED ORDER — LOSARTAN POTASSIUM-HCTZ 50-12.5 MG PO TABS
1.0000 | ORAL_TABLET | Freq: Every day | ORAL | 3 refills | Status: DC
  Filled 2022-11-23: qty 90, 90d supply, fill #0
  Filled 2023-02-03: qty 90, 90d supply, fill #1
  Filled 2023-05-26: qty 90, 90d supply, fill #2
  Filled 2023-08-20: qty 90, 90d supply, fill #3

## 2022-12-07 ENCOUNTER — Other Ambulatory Visit: Payer: Self-pay | Admitting: Emergency Medicine

## 2022-12-07 DIAGNOSIS — K1370 Unspecified lesions of oral mucosa: Secondary | ICD-10-CM

## 2022-12-07 NOTE — Progress Notes (Signed)
Patient has developed soft ablation on 2 inner side of right lower lip that started a couple weeks ago. Physical exam shows soft rubbery nontender mass to right lower lip. Needs oral surgery evaluation.  Referral placed today.

## 2022-12-09 ENCOUNTER — Other Ambulatory Visit: Payer: Self-pay

## 2022-12-10 ENCOUNTER — Other Ambulatory Visit (HOSPITAL_COMMUNITY): Payer: Self-pay

## 2023-01-16 ENCOUNTER — Other Ambulatory Visit (HOSPITAL_COMMUNITY): Payer: Self-pay

## 2023-01-22 ENCOUNTER — Other Ambulatory Visit (HOSPITAL_COMMUNITY): Payer: Self-pay

## 2023-01-28 ENCOUNTER — Other Ambulatory Visit (HOSPITAL_COMMUNITY): Payer: Self-pay

## 2023-02-03 ENCOUNTER — Other Ambulatory Visit: Payer: Self-pay | Admitting: Emergency Medicine

## 2023-02-03 MED ORDER — DULOXETINE HCL 30 MG PO CPEP
30.0000 mg | ORAL_CAPSULE | Freq: Two times a day (BID) | ORAL | 3 refills | Status: DC
  Filled 2023-02-03 – 2023-02-24 (×2): qty 180, 90d supply, fill #0
  Filled 2023-05-26: qty 180, 90d supply, fill #1
  Filled 2023-11-23: qty 180, 90d supply, fill #2

## 2023-02-04 ENCOUNTER — Other Ambulatory Visit (HOSPITAL_COMMUNITY): Payer: Self-pay

## 2023-02-04 ENCOUNTER — Other Ambulatory Visit: Payer: Self-pay

## 2023-02-05 ENCOUNTER — Other Ambulatory Visit: Payer: Self-pay

## 2023-02-11 ENCOUNTER — Ambulatory Visit: Payer: Commercial Managed Care - PPO | Admitting: Cardiology

## 2023-02-11 ENCOUNTER — Other Ambulatory Visit: Payer: Self-pay | Admitting: Emergency Medicine

## 2023-02-11 MED ORDER — PREDNISONE 20 MG PO TABS: 40.0000 mg | ORAL_TABLET | Freq: Every day | ORAL | 3 refills | Status: AC

## 2023-02-19 ENCOUNTER — Other Ambulatory Visit: Payer: Self-pay | Admitting: Emergency Medicine

## 2023-02-19 MED ORDER — TAMSULOSIN HCL 0.4 MG PO CAPS: 0.4000 mg | ORAL_CAPSULE | Freq: Every day | ORAL | 3 refills | Status: DC

## 2023-02-25 ENCOUNTER — Other Ambulatory Visit: Payer: Self-pay

## 2023-02-25 ENCOUNTER — Other Ambulatory Visit: Payer: Self-pay | Admitting: Emergency Medicine

## 2023-02-25 ENCOUNTER — Other Ambulatory Visit (HOSPITAL_COMMUNITY): Payer: Self-pay

## 2023-02-25 MED ORDER — ALPRAZOLAM 0.25 MG PO TABS
0.2500 mg | ORAL_TABLET | Freq: Two times a day (BID) | ORAL | 3 refills | Status: DC | PRN
  Filled 2023-02-25: qty 30, 15d supply, fill #0
  Filled 2023-05-19: qty 30, 15d supply, fill #1
  Filled 2023-07-06: qty 30, 15d supply, fill #2
  Filled 2023-08-19: qty 30, 15d supply, fill #3

## 2023-03-01 ENCOUNTER — Encounter: Payer: Self-pay | Admitting: Cardiology

## 2023-03-01 ENCOUNTER — Ambulatory Visit: Payer: Commercial Managed Care - PPO | Attending: Cardiology | Admitting: Cardiology

## 2023-03-01 VITALS — BP 130/86 | HR 92 | Ht 65.0 in | Wt 185.0 lb

## 2023-03-01 DIAGNOSIS — I10 Essential (primary) hypertension: Secondary | ICD-10-CM | POA: Diagnosis not present

## 2023-03-01 DIAGNOSIS — E7800 Pure hypercholesterolemia, unspecified: Secondary | ICD-10-CM

## 2023-03-01 DIAGNOSIS — R002 Palpitations: Secondary | ICD-10-CM

## 2023-03-01 DIAGNOSIS — I4510 Unspecified right bundle-branch block: Secondary | ICD-10-CM

## 2023-03-01 DIAGNOSIS — R079 Chest pain, unspecified: Secondary | ICD-10-CM

## 2023-03-01 NOTE — Addendum Note (Signed)
Addended by: Luellen Pucker on: 03/01/2023 11:32 AM   Modules accepted: Orders

## 2023-03-01 NOTE — Progress Notes (Addendum)
Cardiology Office Note    Date:  03/01/2023   ID:  Dorthie, Santini 01-09-1961, MRN [id number]  PCP:  Georgina Quint, MD  Cardiologist:  Armanda Magic, MD   Chief Complaint  Patient presents with   Follow-up    Chronic right bundle branch block, hyperlipidemia, hypertension     History of Present Illness:  Patricia Mathews is a 62 y.o. female with a hx of HTN, chronic RBBB, GAD and panic attacks, OSA on CPAP, SLE with Sjogren's disease, Carcinoid stage 1A s/p wedge resection of right lung x 2 and fibromyalgia.  She is following with oncology She also has a fm hix of cardiac amyloidosis.  Her father had mitral valve repair.  Her brother has discoid lupus and 2 brothers with prostate CA an sister with breast Ca.    She does have a remote hx of tobacco use but no fm hx of CAD. She had a coronary Ca score in 2016 and 2022 that were normal with calcium score 0.  Her last EKG that showed no RBBB was 2014 and then showed up in 10/2020 at time of preop EKG for a uterine mass.  2D echo 08/2021 showed EF 60 to 65% with grade 1 diastolic dysfunction and trivial MR.  Event monitor 10/28/2021 for palpitations showed no arrhythmias.  She is here today for followup and is doing well.  She tells me that she thinks she has been having panic attacks.  She says that suddenly while watching TV she will get pain across her entire chest that is excruciating with no radiation to her neck, jaw or arms and stays there for a few minutes.  It has only  happened twice and both times while watching TV.  She had some discomfort in her stomach with it and nauseated as well.  No diaphoresis or SOB.  She had knee repair and gained 40lbs and since then has noticed DOE when going up stairs but can work out at [employer] with no problems.  She is under a lot of stress.  She denies any PND, orthopnea, LE edema, dizziness, palpitations or syncope. She is compliant with her meds and is tolerating meds with no SE.     Past Medical History:  Diagnosis Date   Dyspnea    Dysrhythmia    Endometrial polyp    Family history of adverse reaction to anesthesia    Patient states her mother had an adverse reaction to anesthesia but she is not sure exactly what it was   Fibromyalgia    GAD (generalized anxiety disorder)    History of panic attacks    Hypertension    followed by pcp   Medical history non-contributory    Neuropathy    OSA on CPAP    followed by dr dohmeier--- per study 02-02-2018 moderate to severe osa   Right bundle branch block    Sjogren's disease    Systemic lupus    rheumonotologist-- dr Fontaine No    Past Surgical History:  Procedure Laterality Date   CESAREAN SECTION  1986;  1989   CHONDROPLASTY Left 12/09/2021   Procedure: LEFT KNEE MEDIAL ROOT REPAIR, FEMORAL CHONDROPLASTY;  Surgeon: Huel Cote, MD;  Location: [facility];  Service: Orthopedics;  Laterality: Left;  general with regional block   COLONOSCOPY WITH PROPOFOL  10/2012   DILATATION & CURETTAGE/HYSTEROSCOPY WITH MYOSURE N/A 11/05/2020   Procedure: DILATATION & CURETTAGE/HYSTEROSCOPY WITH MYOSURE;  Surgeon: Genia Del, MD;  Location:  SURGERY CENTER;  Service: Gynecology;  Laterality: N/A;  request to follow in [place] [facility] block time ~10:00am requests one hour   FIDUCIAL MARKER PLACEMENT  09/11/2021   Procedure: FIDUCIAL DYE MARKING;  Surgeon: Josephine Igo, DO;  Location: [facility];  Service: Pulmonary;;   INTERCOSTAL NERVE BLOCK Right 03/26/2021   Procedure: INTERCOSTAL NERVE BLOCK;  Surgeon: Corliss Skains, MD;  Location: [facility];  Service: Thoracic;  Laterality: Right;   INTERCOSTAL NERVE BLOCK Right 09/11/2021   Procedure: INTERCOSTAL NERVE BLOCK;  Surgeon: Corliss Skains, MD;  Location: [facility];  Service: Thoracic;  Laterality: Right;   LYMPH NODE DISSECTION  03/26/2021   Procedure: LYMPH NODE DISSECTION;  Surgeon: Corliss Skains, MD;  Location: [facility];   Service: Thoracic;;   LYMPH NODE DISSECTION Right 09/11/2021   Procedure: LYMPH NODE DISSECTION;  Surgeon: Corliss Skains, MD;  Location: [facility];  Service: Thoracic;  Laterality: Right;   thorascopy Right    Robotic Assisted   VIDEO BRONCHOSCOPY WITH ENDOBRONCHIAL NAVIGATION N/A 09/11/2021   Procedure: VIDEO BRONCHOSCOPY WITH ENDOBRONCHIAL NAVIGATION;  Surgeon: Josephine Igo, DO;  Location: [facility];  Service: Pulmonary;  Laterality: N/A;    Current Medications: No outpatient medications have been marked as taking for the 03/01/23 encounter (Office Visit) with Quintella Reichert, MD.    Allergies:   Penicillins   Social History   Socioeconomic History   Marital status: Married    Spouse name: Not on file   Number of children: 2   Years of education: post-grad   Highest education level: Not on file  Occupational History   Not on file  Tobacco Use   Smoking status: Former    Years: 10    Types: Cigarettes    Quit date: 1989    Years since quitting: 35.3   Smokeless tobacco: Never  Vaping Use   Vaping Use: Never used  Substance and Sexual Activity   Alcohol use: Yes    Comment: occasional- social   Drug use: Never   Sexual activity: Yes    Partners: Male    Birth control/protection: Post-menopausal    Comment: 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17, PARTNERS- 3, MARRIED- 53 YRS   Other Topics Concern   Not on file  Social History Narrative   Lives at home with her husband   Right handed   2 cups of caffeine daily   Social Determinants of Health   Financial Resource Strain: Not on file  Food Insecurity: Not on file  Transportation Needs: Not on file  Physical Activity: Not on file  Stress: Not on file  Social Connections: Not on file     Family History:  The patient's family history includes Breast cancer in her sister; Cancer in her brother and father; Heart failure in her father; Hypertension in her brother, brother, brother, father, mother, and sister; Lung disease in her  father; Lupus in her brother and sister; Osteoarthritis in her mother; Sarcoidosis in her paternal aunt.   ROS:   Please see the history of present illness.    ROS All other systems reviewed and are negative.      No data to display             PHYSICAL EXAM:   VS:  There were no vitals taken for this visit.   GEN: Well nourished, well developed in no acute distress HEENT: Normal NECK: No JVD; No carotid bruits LYMPHATICS: No lymphadenopathy CARDIAC:RRR, no murmurs, rubs, gallops  RESPIRATORY:  Clear to auscultation without rales, wheezing or rhonchi  ABDOMEN: Soft, non-tender, non-distended MUSCULOSKELETAL:  No edema; No deformity  SKIN: Warm and dry NEUROLOGIC:  Alert and oriented x 3 PSYCHIATRIC:  Normal affect  Wt Readings from Last 3 Encounters:  11/13/22 195 lb (88.5 kg)  12/09/21 231 lb 4.2 oz (104.9 kg)  10/24/21 219 lb (99.3 kg)      Studies/Labs Reviewed:   EKG:  EKG is ordered today.  The ekg ordered demonstrates NSR with RBBB and LAFB  Recent Labs: 10/30/2022: ALT 34; BUN 14; Creatinine, Ser 0.89; Hemoglobin 13.2; Platelets 361; Potassium 4.1; Sodium 139   Lipid Panel    Component Value Date/Time   CHOL 153 10/30/2022 0816   TRIG 97 10/30/2022 0816   HDL 84 10/30/2022 0816   LDLCALC 52 10/30/2022 0816     Additional studies/ records that were reviewed today include:  none    ASSESSMENT:    1. RBBB   2. Essential hypertension, benign   3. Pure hypercholesterolemia   4. Palpitations      PLAN:  In order of problems listed above:  RBBB/Chest pain -this is chronic  -she has CRF including HTN, HLD, remote tobacco use and autoimmune disorder -2D echo in 2022 was normal -Coronary calcium for 2016 and 2022 was 0 -now had had 2 episodes of CP at rest that she thinks are related to panic attacks but are very severe -I will get a Norberta Keens to rule out ischemia -Shared Decision Making/Informed Consent The risks [chest pain, shortness  of breath, cardiac arrhythmias, dizziness, blood pressure fluctuations, myocardial infarction, stroke/transient ischemic attack, nausea, vomiting, allergic reaction, radiation exposure, metallic taste sensation and life-threatening complications (estimated to be 1 in 10,000)], benefits (risk stratification, diagnosing coronary artery disease, treatment guidance) and alternatives of a nuclear stress test were discussed in detail with Ms. Keadle and she agrees to proceed.  2.  HTN -BP controlled on exam today -Continue prescription drug management with losartan HCT 50-12.5 mg daily with as needed refills  3.  HLD -LDL goal < 100 -I have personally reviewed and interpreted outside labs performed by patient's PCP which showed LDL 52 and HDL 84 on 10/30/2022 -Continue prescription drug management with Crestor 10 mg daily with as needed refills  4.  Palpitations -No arrhythmias on event monitor 2022  Follow-up as needed  Time Spent: 20 minutes total time of encounter, including 15 minutes spent in face-to-face patient care on the date of this encounter. This time includes coordination of care and counseling regarding above mentioned problem list. Remainder of non-face-to-face time involved reviewing chart documents/testing relevant to the patient encounter and documentation in the medical record. I have independently reviewed documentation from referring provider  Medication Adjustments/Labs and Tests Ordered: Current medicines are reviewed at length with the patient today.  Concerns regarding medicines are outlined above.  Medication changes, Labs and Tests ordered today are listed in the Patient Instructions below.  There are no Patient Instructions on file for this visit.   Signed, Armanda Magic, MD  03/01/2023 10:38 AM    [facility] [address] [place], [place], [place]  [address] Phone: [phone]; Fax: [phone]

## 2023-03-01 NOTE — Patient Instructions (Signed)
Medication Instructions:  Your physician recommends that you continue on your current medications as directed. Please refer to the Current Medication list given to you today.  *If you need a refill on your cardiac medications before your next appointment, please call your pharmacy*   Lab Work: None.  If you have labs (blood work) drawn today and your tests are completely normal, you will receive your results only by: MyChart Message (if you have MyChart) OR A paper copy in the mail If you have any lab test that is abnormal or we need to change your treatment, we will call you to review the results.   Testing/Procedures: Your physician has requested that you have a lexiscan myoview. For further information please visit [email or web address] Please follow instruction sheet, as given.    Follow-Up: At [facility], you and your health needs are our priority.  As part of our continuing mission to provide you with exceptional heart care, we have created designated Provider Care Teams.  These Care Teams include your primary Cardiologist (physician) and Advanced Practice Providers (APPs -  Physician Assistants and Nurse Practitioners) who all work together to provide you with the care you need, when you need it.  We recommend signing up for the patient portal called "MyChart".  Sign up information is provided on this After Visit Summary.  MyChart is used to connect with patients for Virtual Visits (Telemedicine).  Patients are able to view lab/test results, encounter notes, upcoming appointments, etc.  Non-urgent messages can be sent to your provider as well.   To learn more about what you can do with MyChart, go to [email or web address].    Your next appointment will be dependent on your stress test results and it will be with:    Provider:    Dr. Armanda Magic, MD

## 2023-03-01 NOTE — Addendum Note (Signed)
Addended by: Rexene Edison L on: 03/01/2023 11:30 AM   Modules accepted: Orders

## 2023-03-08 ENCOUNTER — Encounter: Payer: Self-pay | Admitting: Cardiology

## 2023-03-08 ENCOUNTER — Other Ambulatory Visit (HOSPITAL_COMMUNITY): Payer: Self-pay

## 2023-03-08 ENCOUNTER — Other Ambulatory Visit: Payer: Self-pay | Admitting: Emergency Medicine

## 2023-03-08 DIAGNOSIS — R399 Unspecified symptoms and signs involving the genitourinary system: Secondary | ICD-10-CM

## 2023-03-08 DIAGNOSIS — G4733 Obstructive sleep apnea (adult) (pediatric): Secondary | ICD-10-CM

## 2023-03-08 MED ORDER — TAMSULOSIN HCL 0.4 MG PO CAPS
0.4000 mg | ORAL_CAPSULE | Freq: Every day | ORAL | 3 refills | Status: DC
Start: 2023-03-08 — End: 2023-04-21
  Filled 2023-03-08: qty 90, 90d supply, fill #0

## 2023-03-08 NOTE — Addendum Note (Signed)
Addended by: Quintella Reichert on: 03/08/2023 01:23 PM   Modules accepted: Orders

## 2023-03-10 ENCOUNTER — Telehealth (HOSPITAL_COMMUNITY): Payer: Self-pay | Admitting: *Deleted

## 2023-03-10 NOTE — Telephone Encounter (Signed)
Spoke with patient and she was given detailed instructions regarding her STRESS TEST on 03/12/23 at 8:00.

## 2023-03-11 ENCOUNTER — Telehealth: Payer: Self-pay | Admitting: Cardiology

## 2023-03-11 NOTE — Telephone Encounter (Signed)
Pt stated she's having a MYOCARDIAL PERFUSION tomorrow and would like to know if she's able to take her BP today or not. Please advise

## 2023-03-11 NOTE — Telephone Encounter (Signed)
Returned patient's call to clarify Lexiscan Myocardial Perfusion test prep. Advised patient she can take her blood pressure meds today and tomorrow, reminded patient no caffeine or decaf beverages 12 hrs prior to test, no food or beverages 3 hours prior except water. Patient verbalizes understanding.

## 2023-03-12 ENCOUNTER — Ambulatory Visit (HOSPITAL_COMMUNITY): Payer: Commercial Managed Care - PPO | Attending: Internal Medicine

## 2023-03-12 DIAGNOSIS — R079 Chest pain, unspecified: Secondary | ICD-10-CM | POA: Insufficient documentation

## 2023-03-12 LAB — MYOCARDIAL PERFUSION IMAGING
LV dias vol: 52 mL (ref 46–106)
LV sys vol: 21 mL
Nuc Stress EF: 60 %
Peak HR: 106 {beats}/min
Rest HR: 75 {beats}/min
Rest Nuclear Isotope Dose: 10.2 mCi
SDS: 1
SRS: 0
SSS: 1
ST Depression (mm): 0 mm
Stress Nuclear Isotope Dose: 32.5 mCi
TID: 0.98

## 2023-03-12 MED ORDER — REGADENOSON 0.4 MG/5ML IV SOLN
0.4000 mg | Freq: Once | INTRAVENOUS | Status: AC
Start: 2023-03-12 — End: 2023-03-12
  Administered 2023-03-12: 0.4 mg via INTRAVENOUS

## 2023-03-12 MED ORDER — TECHNETIUM TC 99M TETROFOSMIN IV KIT
10.2000 | PACK | Freq: Once | INTRAVENOUS | Status: AC | PRN
  Administered 2023-03-12: 10.2 via INTRAVENOUS

## 2023-03-12 MED ORDER — TECHNETIUM TC 99M TETROFOSMIN IV KIT
32.5000 | PACK | Freq: Once | INTRAVENOUS | Status: AC | PRN
  Administered 2023-03-12: 32.5 via INTRAVENOUS

## 2023-03-19 ENCOUNTER — Telehealth: Payer: Self-pay

## 2023-03-19 NOTE — Telephone Encounter (Signed)
Called and spoke with patient and spouse. Reviewed Dr. Norris Cross advice that because stress test was a lexiscan, HR was not a factor in testing for perfusion. Patient verbalizes understanding.

## 2023-03-19 NOTE — Telephone Encounter (Signed)
-----   Message from Quintella Reichert, MD sent at 03/12/2023  2:09 PM EDT ----- Please let patient know that stress test was fine

## 2023-03-19 NOTE — Telephone Encounter (Signed)
Called patient to see if she had any questions about recent stress test. Patient's spouse (DPR) answered phone. Reviewed results with Dr. Edwina Barth, he notes that her HR only got up to 102 on stress test and is asking if that HR is adequate to assess perfusion. Forwarded to Dr. Mayford Knife.

## 2023-04-09 ENCOUNTER — Encounter: Payer: Self-pay | Admitting: Emergency Medicine

## 2023-04-09 DIAGNOSIS — H5203 Hypermetropia, bilateral: Secondary | ICD-10-CM | POA: Diagnosis not present

## 2023-04-19 ENCOUNTER — Other Ambulatory Visit: Payer: Self-pay | Admitting: Emergency Medicine

## 2023-04-19 DIAGNOSIS — Z1231 Encounter for screening mammogram for malignant neoplasm of breast: Secondary | ICD-10-CM

## 2023-04-21 ENCOUNTER — Encounter: Payer: Self-pay | Admitting: Neurology

## 2023-04-21 ENCOUNTER — Ambulatory Visit (INDEPENDENT_AMBULATORY_CARE_PROVIDER_SITE_OTHER): Payer: Commercial Managed Care - PPO | Admitting: Neurology

## 2023-04-21 VITALS — BP 132/84 | HR 72 | Ht 65.0 in | Wt 185.0 lb

## 2023-04-21 DIAGNOSIS — G4733 Obstructive sleep apnea (adult) (pediatric): Secondary | ICD-10-CM | POA: Diagnosis not present

## 2023-04-21 DIAGNOSIS — C7A090 Malignant carcinoid tumor of the bronchus and lung: Secondary | ICD-10-CM

## 2023-04-21 NOTE — Patient Instructions (Addendum)
We will provide you woth a HST after insurance has agreed, and that night you will not need to use your CPAP.   Screening for Sleep Apnea  Sleep apnea is a condition in which breathing pauses or becomes shallow during sleep. Sleep apnea screening is a test to determine if you are at risk for sleep apnea. The test includes a series of questions. It will only takes a few minutes. Your health care provider may ask you to have this test in preparation for surgery or as part of a physical exam. What are the symptoms of sleep apnea? Common symptoms of sleep apnea include: Snoring. Waking up often at night. Daytime sleepiness. Pauses in breathing. Choking or gasping during sleep. Irritability. Forgetfulness. Trouble thinking clearly. Depression. Personality changes. Most people with sleep apnea do not know that they have it. What are the advantages of sleep apnea screening? Getting screened for sleep apnea can help: Ensure your safety. It is important for your health care providers to know whether or not you have sleep apnea, especially if you are having surgery or have other long-term (chronic) health conditions. Improve your health and allow you to get a better night's rest. Restful sleep can help you: Have more energy. Lose weight. Improve high blood pressure. Improve diabetes management. Prevent stroke. Prevent car accidents. What happens during the screening? Screening usually includes being asked a list of questions about your sleep quality. Some questions you may be asked include: Do you snore? Is your sleep restless? Do you have daytime sleepiness? Has a partner or spouse told you that you stop breathing during sleep? Have you had trouble concentrating or memory loss? What is your age? What is your neck circumference? To measure your neck, keep your back straight and gently wrap the tape measure around your neck. Put the tape measure at the middle of your neck, between your chin  and collarbone. What is your sex assigned at birth? Do you have or are you being treated for high blood pressure? If your screening test is positive, you are at risk for the condition. Further testing may be needed to confirm a diagnosis of sleep apnea. Where to find more information You can find screening tools online or at your health care clinic. For more information about sleep apnea screening and healthy sleep, visit these websites: Centers for Disease Control and Prevention: FootballExhibition.com.br American Sleep Apnea Association: www.sleepapnea.org Contact a health care provider if: You think that you may have sleep apnea. Summary Sleep apnea screening can help determine if you are at risk for sleep apnea. It is important for your health care providers to know whether or not you have sleep apnea, especially if you are having surgery or have other chronic health conditions. You may be asked to take a screening test for sleep apnea in preparation for surgery or as part of a physical exam. This information is not intended to replace advice given to you by your health care provider. Make sure you discuss any questions you have with your health care provider. Document Revised: 10/04/2020 Document Reviewed: 10/04/2020 Elsevier Patient Education  2024 [employer].

## 2023-04-21 NOTE — Progress Notes (Signed)
SLEEP MEDICINE CLINIC    Provider:  Melvyn Novas, MD  Primary Care Physician:  Georgina Quint, MD [address] [place] [address]     Referring Provider: Georgina Quint, [place] [address] [place],  [place] [address]          Chief Complaint according to patient   Patient presents with:     New Patient (Initial Visit)           HISTORY OF PRESENT ILLNESS:   04-21-2023:  Patricia Mathews is a 62 y.o. female patient who is seen upon referral on 04/21/2023 from Husband Dr Elna Breslow, after a hiatus of 5 years:   Chief concern according to patient : "I need a new machine after 5 years ".  DATE OF RECORDING:        02/02/2018 REFERRING M.D.:                 Naomie Dean, MD Study Performed:   Baseline Polysomnogram HISTORY:  Patricia Mathews is a 62 y.o. female patient, seen in referral from Dr. Lucia Gaskins for an evaluation of snoring and possible sleep apnea.   Dr. Lucia Gaskins is working her up for headaches with vertigo and nystagmus, stabbing pain sensation, feeling pain that radiates down her spine and occurs weekly. She was diagnosed with Lupus and Sjogren's syndrome over 10 years ago while living in [place]. Her husband, who is trained as an emergency room physician, has stated that she snores. She is post- menopausal, sleep quality was lower before she received hormone replacement therapy. The patient endorsed the Epworth Sleepiness Scale at 9/24 points and FSS at 26/63 points, GDS 2/ 15 points.  The patient's weight 194 pounds with a height of 65 (inches), resulting in a BMI of 32.3 kg/m2.The patient's neck circumference measured 14.5 inches.  Patricia Mathews had returned  for a CPAP titration study following a PSG from 02/02/2018. The study revealed an AHI of 27.9/h, REM AHI of 48.3, and SpO2 nadir of 79%. Severe PLMs were recorded.   The patient endorsed the Epworth Sleepiness Scale at 9/24 points and the Fatigue Score at 26 points.   The patient's weight 194  pounds with a height of 65 (inches), resulting in a BMI of 32.3 kg/m2. The patient's neck circumference measured 14.5 inches.  CURRENT MEDICATIONS: Xanax, Cymbalta, Prinivil, Prometrium  CPAP was initiated at 5 cmH20 with heated humidity per AASM split night standards and pressure was advanced to 14 cmH20 because of hypopneas, apneas and desaturations.  At a PAP pressure of 14 cmH20, there was a reduction of the AHI to 0.0 with improvement of the above symptoms of obstructive sleep apnea.     I have the pleasure of seeing Patricia Mathews 04/21/23 a right-handed female with an internal history of slow progressing lung cancer, several lung surgeries last 2023 carcinoid nodular tumor, , biopsies,  possible sleep disorder.  She has had one knee repair surgery 2023.   The patient had the first sleep study in the year 2019 with EDS- excessive daytime sleepiness.   with a result of an AHI ( Apnea Hypopnea index)  of OSA.    The patient sees my colleague Dr. Lucia Gaskins, who is working her up for headaches with vertigo and with established nystagmus.  She also has a stabbing pain sensation.  Is feeling pain that radiates down her spine and occurs weekly.  She has not identified triggers.  Her vertigo can also occur with and  without a headache.  There is no family history of migraines.  She has noted up to 18 headache days a month.  Her husband, who is trained as an emergency room physician, has stated that she snores significantly.    She has also suffered from extreme fatigue, sleepiness, malaise and she has had drop attacks.  She was diagnosed with lupus and Sjogren's syndrome over 10 years ago while living in [place].  She moved from [place], [place] about a year ago to the [place] area with her MD husband , who is practicing as a family Radio broadcast assistant.  She could not tolerate Plaquenil been first treated, prednisone Tapers have made her migraines worse, but she uses low-dose estrogen which helps hot flashes  and helps her sleep.  She had felt some relief with Cymbalta.  She also noted dry eyes and dry mouth- but this does not necessarily feel worse in the morning.   Sleep habits are as follows: Patricia Mathews reports that she may stay up watching TV until her bedtime around 10 PM if her husband goes to work next day.  If he does not have to go to work next day may actually watch Netflix up to 1 AM. She has no TV in the bedroom- she watches in her den.  Once in bed, she prays and goes promptly to sleep.  The patient's bedroom is cool, quiet and dark.  She has a bedroom with her husband, prefers to sleep on her right side and only uses one pillow.  The mattress is flat in a nonadjustable bed.  She usually is able to sleep through for several hours before she wakes,   often she does not even have a bathroom break at night. If she doesn't set her alarm and has not to get up in the morning she has no difficulties to sleep until 10 or 11 AM the next day.  She craves more sleep, wakes up achy but not tired.  Unfortunately she often has headaches in the morning this can be down the neck radiating sharp stabbing sensations or throbbing global headaches.  She usually is not nauseated, not dizzy.  Some headaches wake her out of sleep, sharp stabbing, migraine or cluster type.      Sleep medical history and family sleep history: no neck injury, normal cervical spine MRI. Brain MRI.  Brother with Sjoegrens, lupus and , 2 brothers and father with cancer ( prostate). Son had uveitis once while still in high school.  Social history:  [place] born Daughter of a cardiologist, married to Administrator, arts, parents living. 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   2 adult children with grandchildren,  She has been smoking until 27 years ago, 1992, while her daughter was in preschool.  Drinks wine, or whisky sour. 1-2 a week. Caffeine : coffee in AM and noon- 6  espressi doppio ! Soda - rarely,  Some times iced  tea, no energy drinks.      Sleep habits are as follows: The patient's dinner time is between 5.30- PM. The patient goes to bed at 7.30 PM and continues to sleep for 7-8 hours, wakes for 2-3 bathroom breaks, the first time at 12 midnight, 2.30 AM and again 5 AM.  Daughter calls her at 7.30 AM.   The preferred sleep position is laterally, with the support of 1-2 pillows.  Dreams are reportedly frequent/vivid.   7.30 AM is the usual rise time. She reports feeling refreshed and restored in  AM, without symptoms such as dry mouth, no longer having morning headaches, and no more residual fatigue on CPAP. Naps are taken very rarely, lasting from 15 to 40 minutes.   Review of Systems: Out of a complete 14 system review, the patient complains of only the following symptoms, and all other reviewed systems are negative.:  Snoring, headaches of several qualities, including sleep related, achiness, joint pain, less muscle pain. Hypersomnia    Epworth score 1/ 24  from 9/ 24 before CPAP Fatigue severity score 16/ 63  from 26 / 63  before CPAP.  , depression score 2/ 15      How likely are you to doze in the following situations: 0 = not likely, 1 = slight chance, 2 = moderate chance, 3 = high chance   Sitting and Reading? Watching Television? Sitting inactive in a public place (theater or meeting)? As a passenger in a car for an hour without a break? Lying down in the afternoon when circumstances permit? Sitting and talking to someone? Sitting quietly after lunch without alcohol? In a car, while stopped for a few minutes in traffic?   Social History   Socioeconomic History   Marital status: Married    Spouse name: Not on file   Number of children: 2   Years of education: post-grad   Highest education level: Not on file  Occupational History   Not on file  Tobacco Use   Smoking status: Former    Years: 10    Types: Cigarettes    Quit date: 1989    Years since quitting: 35.4   Smokeless  tobacco: Never  Vaping Use   Vaping Use: Never used  Substance and Sexual Activity   Alcohol use: Yes    Comment: occasional- social   Drug use: Never   Sexual activity: Yes    Partners: Male    Birth control/protection: Post-menopausal    Comment: 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17, PARTNERS- 3, MARRIED- 28 YRS   Other Topics Concern   Not on file  Social History Narrative   Lives at home with her husband   Right handed   2 cups of caffeine daily   Social Determinants of Health   Financial Resource Strain: Not on file  Food Insecurity: Not on file  Transportation Needs: Not on file  Physical Activity: Not on file  Stress: Not on file  Social Connections: Not on file    Family History  Problem Relation Age of Onset   Hypertension Mother    Osteoarthritis Mother    Cancer Father        PROSTATE   Hypertension Father    Heart failure Father    Lung disease Father    Breast cancer Sister    Hypertension Sister    Lupus Sister    Sarcoidosis Paternal Aunt    Cancer Brother        PROSTATE   Lupus Brother    Hypertension Brother    Hypertension Brother    Hypertension Brother     Past Medical History:  Diagnosis Date   Dyspnea    Dysrhythmia    Endometrial polyp    Family history of adverse reaction to anesthesia    Patient states her mother had an adverse reaction to anesthesia but she is not sure exactly what it was   Fibromyalgia    GAD (generalized anxiety disorder)    History of panic attacks    Hypertension    followed by pcp  Medical history non-contributory    Neuropathy    OSA on CPAP    followed by dr Gevon Markus--- per study 02-02-2018 moderate to severe osa   Right bundle branch block    Sjogren's disease (HCC)    Systemic lupus (HCC)    rheumonotologist-- dr Fontaine No    Past Surgical History:  Procedure Laterality Date   CESAREAN SECTION  1986;  1989   CHONDROPLASTY Left 12/09/2021   Procedure: LEFT KNEE MEDIAL ROOT REPAIR, FEMORAL CHONDROPLASTY;   Surgeon: Huel Cote, MD;  Location: [facility];  Service: Orthopedics;  Laterality: Left;  general with regional block   COLONOSCOPY WITH PROPOFOL  10/2012   DILATATION & CURETTAGE/HYSTEROSCOPY WITH MYOSURE N/A 11/05/2020   Procedure: DILATATION & CURETTAGE/HYSTEROSCOPY WITH MYOSURE;  Surgeon: Genia Del, MD;  Location: [facility];  Service: Gynecology;  Laterality: N/A;  request to follow in [place] [facility] block time ~10:00am requests one hour   FIDUCIAL MARKER PLACEMENT  09/11/2021   Procedure: FIDUCIAL DYE MARKING;  Surgeon: Josephine Igo, DO;  Location: [facility];  Service: Pulmonary;;   INTERCOSTAL NERVE BLOCK Right 03/26/2021   Procedure: INTERCOSTAL NERVE BLOCK;  Surgeon: Corliss Skains, MD;  Location: [facility];  Service: Thoracic;  Laterality: Right;   INTERCOSTAL NERVE BLOCK Right 09/11/2021   Procedure: INTERCOSTAL NERVE BLOCK;  Surgeon: Corliss Skains, MD;  Location: [facility];  Service: Thoracic;  Laterality: Right;   LYMPH NODE DISSECTION  03/26/2021   Procedure: LYMPH NODE DISSECTION;  Surgeon: Corliss Skains, MD;  Location: [facility];  Service: Thoracic;;   LYMPH NODE DISSECTION Right 09/11/2021   Procedure: LYMPH NODE DISSECTION;  Surgeon: Corliss Skains, MD;  Location: [facility];  Service: Thoracic;  Laterality: Right;   thorascopy Right    Robotic Assisted   VIDEO BRONCHOSCOPY WITH ENDOBRONCHIAL NAVIGATION N/A 09/11/2021   Procedure: VIDEO BRONCHOSCOPY WITH ENDOBRONCHIAL NAVIGATION;  Surgeon: Josephine Igo, DO;  Location: [facility];  Service: Pulmonary;  Laterality: N/A;     Current Outpatient Medications on File Prior to Visit  Medication Sig Dispense Refill   ALPRAZolam (XANAX) 0.25 MG tablet Take 1 tablet (0.25 mg total) by mouth 2 (two) times daily as needed for anxiety. 30 tablet 3   DULoxetine (CYMBALTA) 30 MG capsule Take 1 capsule (30 mg total) by mouth 2 (two) times daily. 180 capsule 3    losartan-hydrochlorothiazide (HYZAAR) 50-12.5 MG tablet Take 1 tablet by mouth daily. 90 tablet 3   MELATONIN PO Take 1 capsule by mouth at bedtime as needed (sleep).     rosuvastatin (CRESTOR) 10 MG tablet Take 1 tablet (10 mg total) by mouth daily. 90 tablet 3   No current facility-administered medications on file prior to visit.    Allergies  Allergen Reactions   Penicillins Anaphylaxis     DIAGNOSTIC DATA (LABS, IMAGING, TESTING) - I reviewed patient records, labs, notes, testing and imaging myself where available.  Lab Results  Component Value Date   WBC 6.8 10/30/2022   HGB 13.2 10/30/2022   HCT 40.3 10/30/2022   MCV 93 10/30/2022   PLT 361 10/30/2022      Component Value Date/Time   NA 139 10/30/2022 0816   K 4.1 10/30/2022 0816   CL 98 10/30/2022 0816   CO2 23 10/30/2022 0816   GLUCOSE 92 10/30/2022 0816   GLUCOSE 119 (H) 05/01/2022 0918   BUN 14 10/30/2022 0816   CREATININE 0.89 10/30/2022 0816   CREATININE 0.90 05/01/2022 6578  CALCIUM 9.6 10/30/2022 0816   PROT 7.5 10/30/2022 0816   ALBUMIN 4.6 10/30/2022 0816   AST 19 10/30/2022 0816   AST 18 05/01/2022 0918   ALT 34 (H) 10/30/2022 0816   ALT 24 05/01/2022 0918   ALKPHOS 96 10/30/2022 0816   BILITOT 0.6 10/30/2022 0816   BILITOT 0.5 05/01/2022 0918   GFRNONAA >60 05/01/2022 0918   GFRAA 104 10/11/2020 0834   GFRAA >60 01/26/2018 1245   Lab Results  Component Value Date   CHOL 153 10/30/2022   HDL 84 10/30/2022   LDLCALC 52 10/30/2022   TRIG 97 10/30/2022   Lab Results  Component Value Date   HGBA1C 5.7 (H) 10/30/2022   Lab Results  Component Value Date   VITAMINB12 537 10/11/2020   Lab Results  Component Value Date   TSH 1.150 10/11/2020    PHYSICAL EXAM:  Today's Vitals   04/21/23 0900  BP: 132/84  Pulse: 72  Weight: 185 lb (83.9 kg)  Height: 5\' 5"  (1.651 m)   Body mass index is 30.79 kg/m.   Wt Readings from Last 3 Encounters:  04/21/23 185 lb (83.9 kg)  03/12/23 185 lb  (83.9 kg)  03/01/23 185 lb (83.9 kg)     Ht Readings from Last 3 Encounters:  04/21/23 5\' 5"  (1.651 m)  03/12/23 5\' 5"  (1.651 m)  03/01/23 5\' 5"  (1.651 m)      General: General: The patient is awake, alert and appears not in acute distress. The patient is well groomed. Head: Normocephalic, atraumatic. Neck is supple. Mallampati 3,  neck circumference:14.5 . Nasal airflow patent , a TMJ click is not evident . Retrognathia is not seen.  All biological teeth and implant.  Cardiovascular:  Regular rate and rhythm , without  murmurs or carotid bruit, and without distended neck veins. Respiratory: Lungs are clear to auscultation. Skin:  Without evidence of edema, or rash Trunk: BMI is 32. The patient's posture is erect    Neurologic exam : The patient is awake and alert, oriented to place and time.   Memory subjective described as intact.  Attention span & concentration ability appears normal.  Speech is fluent,  without dysarthria, dysphonia or aphasia.  Mood and affect are appropriate.   Cranial nerves: Pupils are equal and briskly reactive to light. Funduscopic exam without evidence of pallor or edema.  Extraocular movements  in vertical and horizontal planes intact and without nystagmus. Visual fields by finger perimetry are intact. Hearing to finger rub intact.   Facial sensation intact to fine touch.  Facial motor strength is symmetric and tongue and uvula move midline. Shoulder shrug was symmetrical.    Motor exam:  Normal tone, muscle bulk and symmetric strength in all extremities. Sensory:  Fine touch, pinprick and vibration were tested in all extremities and  normal. Coordination: Rapid alternating movements /Finger-to-nose maneuver  normal without evidence of ataxia, dysmetria or tremor. Gait and station: Patient walks without assistive device and is able unassisted to climb up to the exam table. Strength within normal limits.Stance is stable and normal. Turns with 3  Steps.  Romberg testing is negative. Deep tendon reflexes: in the  upper and lower extremities are symmetric and intact. Babinski maneuver response is  downgoing.      ASSESSMENT AND PLAN 62 y.o. year old female  here with: Patricia Mathews has an interesting observation to share while fatigue and sleepiness have definitely improved on CPAP her symptoms of lupus have vanished once she had been treated  for a carcinoid lung tumor the symptoms disappeared and her breathing is much better.  She is here at this time after 5 years hiatus because her machine will be needing to be replaced    1)  OSA: she was last tested in 2019 5 years ago and her machine set up date was 03-07-2018.  She has been 100% compliant user of CPAP with a setting between 9 and 17 cmH2O, 3 cm expiratory relief function and a residual AHI of 5.8/h.  The 95th percentile pressure for this patient is at 14 cm water and she has very few central apneas arising.  I would suggest to increase the maximum pressure to 18 or 19 based on the residual AHI.  She does not have air leakage.  The 95th percentile for air leak is only 1.6 L/min and this is excellent.   Her current interface is a fullface mask and she will continue using it.  2) HST order one night without CPAP. She had recently slept well on a trip to her home state of [place] and did not have trouble.   3) I will order a new CPAP Based on new HST results,   I plan to follow up either personally or through our NP within 4-6 months.   I would like to thank Georgina Quint, MD and [place], [place],  [address] [place],  [place] [address] for allowing me to meet with and to take care of this pleasant patient.   CC: I will share my notes with PCP and Dr Lucia Gaskins, MD .  After spending a total time of  40  minutes face to face and additional time for physical and neurologic examination, review of laboratory studies,  personal review of imaging studies, reports and results  of other testing and review of referral information / records as far as provided in visit,   Electronically signed by: Melvyn Novas, MD 04/21/2023 9:24 AM  [facility] Board certified by The [employer] of Sleep Medicine and Diplomate of the [employer] of Sleep Medicine. Board certified In Neurology through the ABPN, Fellow of the [employer] of Neurology. Medical Director of [facility].

## 2023-04-23 ENCOUNTER — Ambulatory Visit
Admission: RE | Admit: 2023-04-23 | Discharge: 2023-04-23 | Disposition: A | Discharge status: Home / Self Care | Payer: Commercial Managed Care - PPO | Source: Ambulatory Visit | Attending: Emergency Medicine | Admitting: Emergency Medicine

## 2023-04-23 DIAGNOSIS — Z1231 Encounter for screening mammogram for malignant neoplasm of breast: Secondary | ICD-10-CM

## 2023-05-03 ENCOUNTER — Telehealth: Payer: Self-pay | Admitting: Neurology

## 2023-05-03 ENCOUNTER — Encounter: Payer: Self-pay | Admitting: Neurology

## 2023-05-03 NOTE — Telephone Encounter (Signed)
[facility]- [facility] aetna no auth req   Patient is scheduled at [facility] for 05/05/23.

## 2023-05-05 ENCOUNTER — Ambulatory Visit: Payer: Commercial Managed Care - PPO | Admitting: Neurology

## 2023-05-05 DIAGNOSIS — R911 Solitary pulmonary nodule: Secondary | ICD-10-CM

## 2023-05-05 DIAGNOSIS — G4733 Obstructive sleep apnea (adult) (pediatric): Secondary | ICD-10-CM

## 2023-05-05 DIAGNOSIS — C7A090 Malignant carcinoid tumor of the bronchus and lung: Secondary | ICD-10-CM

## 2023-05-05 DIAGNOSIS — R519 Headache, unspecified: Secondary | ICD-10-CM | POA: Diagnosis not present

## 2023-05-14 ENCOUNTER — Encounter: Payer: Self-pay | Admitting: Neurology

## 2023-05-17 NOTE — Progress Notes (Signed)
[facility]   HOME SLEEP TEST REPORT ( by Watch PAT)   STUDY DATE:  05-18-2023 :   Patricia Mathews 62 year old female April 24, 1961 ORDERING CLINICIAN: Melvyn Novas, MD  REFERRING CLINICIAN: Naomie Dean, MD    CLINICAL INFORMATION/HISTORY:  04-21-2023, patient with OSA on CPAP.  04-21-2023:  Patricia Mathews is a 62 y.o. female patient who is seen upon referral on 04/21/2023 from Husband Dr Elna Breslow, after a hiatus of 5 years:   Chief concern according to patient : "I need a new machine after 5 years ".   DATE OF last  RECORDING:        02/02/2018 REFERRING M.D.:                 Naomie Dean, MD Study Performed:   Baseline Polysomnogram HISTORY:  Patricia Mathews is a 62 y.o. female patient, seen in referral from Dr. Lucia Gaskins for an evaluation of snoring and possible sleep apnea.   Dr. Lucia Gaskins is working her up for headaches with vertigo and nystagmus, stabbing pain sensation, feeling pain that radiates down her spine and occurs weekly. She was diagnosed with Lupus and Sjogren's syndrome over 10 years ago while living in [place]. Her husband, who is trained as an emergency room physician, has stated that she snores. sleep quality was lower before she received hormone replacement therapy. The patient endorsed the Epworth Sleepiness Scale at 9/24 points and FSS at 26/63 points, GDS 2/ 15 points.  The patient's weight 194 pounds with a height of 65 (inches), resulting in a BMI of 32.3 kg/m2.The patient's neck circumference measured 14.5 inches.   Patricia Mathews had returned  for a CPAP titration study following a PSG from 02/02/2018. The study revealed an AHI of 27.9/h, REM AHI of 48.3, and SpO2 nadir of 79%. Severe PLMs were recorded.  The patient's weight 194 pounds with a height of 65 (inches), resulting in a BMI of 32.3 kg/m2. The patient's neck circumference measured 14.5 inches.    Epworth sleepiness score: 01 /24.  FSS now at  16/ 63 , GDS 2/ 15.   BMI: 30.8  kg/m   Neck Circumference: 14.5   FINDINGS:   Sleep Summary:   Total Recording Time (hours, min):   9 hours 59 minutes  Total Sleep Time (hours, min):    7 hours 42 minutes             Percent REM (%):     10.2%                                   Respiratory Indices by AASM criteria:   Calculated pAHI (per hour):       30.2/h                      REM pAHI:      45.4/h                                           NREM pAHI:    28.4/h                          Positional AHI: The AHI in supine position was 35.5/h and in nonsupine position  23.1/h.  Snoring reached a mean volume of 42 dB and was present for 38% of total sleep time at time this snoring volume above 60 dB.                                                 Oxygen Saturation Statistics:       O2 Saturation Range (%):     Between a nadir at 85% of the maximal saturation of 99% with a mean saturation of 95%                                  O2 Saturation (minutes) <89%:     0.8 minutes      Pulse Rate Statistics:   Pulse Mean (bpm):   68 bpm              Pulse Range:   Between 50 and 104 bpm              IMPRESSION:  This HST confirms the presence of severe sleep apnea of obstructive origin.  No central apneas were detected or calculated by this home sleep test device.  The patient would be asked to continue CPAP therapy her current machine is 63 years old and will be replaced with a machine with similar settings.  She can choose a new mask if she desires or continue the model she is currently using.   RECOMMENDATION: auto- titration capable CPAP device with a pressure between 6 and 16 cm water pressure, 2 cm ER, heated humidification and mask of  patient's choice.     INTERPRETING PHYSICIAN:   Melvyn Novas, MD

## 2023-05-19 ENCOUNTER — Other Ambulatory Visit: Payer: Self-pay

## 2023-05-24 ENCOUNTER — Other Ambulatory Visit (HOSPITAL_COMMUNITY)
Admission: RE | Admit: 2023-05-24 | Discharge: 2023-05-24 | Disposition: A | Discharge status: Home / Self Care | Payer: Commercial Managed Care - PPO | Source: Ambulatory Visit | Attending: Obstetrics & Gynecology | Admitting: Obstetrics & Gynecology

## 2023-05-24 ENCOUNTER — Ambulatory Visit (INDEPENDENT_AMBULATORY_CARE_PROVIDER_SITE_OTHER): Payer: Commercial Managed Care - PPO | Admitting: Obstetrics & Gynecology

## 2023-05-24 ENCOUNTER — Encounter: Payer: Self-pay | Admitting: Obstetrics & Gynecology

## 2023-05-24 VITALS — BP 116/76 | HR 84 | Ht 64.25 in | Wt 189.0 lb

## 2023-05-24 DIAGNOSIS — C7A090 Malignant carcinoid tumor of the bronchus and lung: Secondary | ICD-10-CM | POA: Diagnosis not present

## 2023-05-24 DIAGNOSIS — Z01419 Encounter for gynecological examination (general) (routine) without abnormal findings: Secondary | ICD-10-CM | POA: Diagnosis not present

## 2023-05-24 DIAGNOSIS — Z780 Asymptomatic menopausal state: Secondary | ICD-10-CM | POA: Diagnosis not present

## 2023-05-24 NOTE — Progress Notes (Signed)
Patricia Mathews 01-May-1961 [id number]   History:    62 y.o.  G2P2 Married.  Husband is a Development worker, community.  From [place]  Speaks both [place] and [place].   RP:  Established patient presenting for annual gyn exam    HPI:  Postmenopause, well on no HRT.  No PMB.  Had HSC Removal of Polyp/D+C 10/2020.  No pelvic pain.  No pain with IC.  Pap Neg 09/2020. Repeat Pap reflex today. Followed for Sjogren syndrome and Lupus. On cymbalta and Lisinopril.  Breasts wnl. Screening mammogram Neg 04/2023.  Mictions/BMs wnl.  BMI 32.19.  Changed to a healthier diet with lower sugar/cholest/salt.  Health labs with Fam MD. Alen Bleacher 2013, will schedule.  Bone Density normal in 2021. Carcinoid tumor of the Rt lung Dxed in 09/2021.      Past medical history,surgical history, family history and social history were all reviewed and documented in the EPIC chart.  Gynecologic History No LMP recorded. Patient is postmenopausal.  Obstetric History OB History  Gravida Para Term Preterm AB Living  2 2 2     2   SAB IAB Ectopic Multiple Live Births               # Outcome Date GA Lbr Len/2nd Weight Sex Type Anes PTL Lv  2 Term           1 Term              ROS: A ROS was performed and pertinent positives and negatives are included in the history. GENERAL: No fevers or chills. HEENT: No change in vision, no earache, sore throat or sinus congestion. NECK: No pain or stiffness. CARDIOVASCULAR: No chest pain or pressure. No palpitations. PULMONARY: No shortness of breath, cough or wheeze. GASTROINTESTINAL: No abdominal pain, nausea, vomiting or diarrhea, melena or bright red blood per rectum. GENITOURINARY: No urinary frequency, urgency, hesitancy or dysuria. MUSCULOSKELETAL: No joint or muscle pain, no back pain, no recent trauma. DERMATOLOGIC: No rash, no itching, no lesions. ENDOCRINE: No polyuria, polydipsia, no heat or cold intolerance. No recent change in weight. HEMATOLOGICAL: No anemia or easy bruising or  bleeding. NEUROLOGIC: No headache, seizures, numbness, tingling or weakness. PSYCHIATRIC: No depression, no loss of interest in normal activity or change in sleep pattern.     Exam:   BP 116/76   Pulse 84   Ht 5' 4.25" (1.632 m)   Wt 189 lb (85.7 kg)   SpO2 97%   BMI 32.19 kg/m   Body mass index is 32.19 kg/m.  General appearance : Well developed well nourished female. No acute distress HEENT: Eyes: no retinal hemorrhage or exudates,  Neck supple, trachea midline, no carotid bruits, no thyroidmegaly Lungs: Clear to auscultation, no rhonchi or wheezes, or rib retractions  Heart: Regular rate and rhythm, no murmurs or gallops Breast:Examined in sitting and supine position were symmetrical in appearance, no palpable masses or tenderness,  no skin retraction, no nipple inversion, no nipple discharge, no skin discoloration, no axillary or supraclavicular lymphadenopathy Abdomen: no palpable masses or tenderness, no rebound or guarding Extremities: no edema or skin discoloration or tenderness  Pelvic: Vulva: Normal             Vagina: No gross lesions or discharge  Cervix: No gross lesions or discharge.  Pap reflex done.  Uterus  AV, normal size, shape and consistency, non-tender and mobile  Adnexa  Without masses or tenderness  Anus: Normal   Assessment/Plan:  62 y.o. female for  annual exam   1. Encounter for routine gynecological examination with Papanicolaou smear of cervix Postmenopause, well on no HRT.  No PMB.  Had HSC Removal of Polyp/D+C 10/2020.  No pelvic pain.  No pain with IC.  Pap Neg 09/2020. Repeat Pap reflex today. Followed for Sjogren syndrome and Lupus. On cymbalta and Lisinopril.  Breasts wnl. Screening mammogram Neg 04/2023.  Mictions/BMs wnl.  BMI 32.19.  Changed to a healthier diet with lower sugar/cholest/salt.  Health labs with Fam MD. Alen Bleacher 2013, will schedule.  BD normal in 2021.  Carcinoid tumor of the Rt lung Dxed in 09/2021. - Cytology - PAP( [facility])  2. Postmenopause Postmenopause, well on no HRT.  No PMB.  Had HSC Removal of Polyp/D+C 10/2020, patho benign.  No pelvic pain.  No pain with IC.   3. Malignant carcinoid tumor of the bronchus and lung (HCC) Has had 2 Robotic surgeries.  Slow growing.  Other orders - VITAMIN D PO; Take by mouth. - UNABLE TO FIND; Med Name: advil/tylenol   Genia Del MD, 11:12 AM

## 2023-05-25 LAB — CYTOLOGY - PAP: Diagnosis: NEGATIVE

## 2023-05-25 NOTE — Procedures (Signed)
[facility]   HOME SLEEP TEST REPORT ( by Watch PAT)   STUDY DATE:  05-18-2023 :   Patricia Mathews 62 year old female 29-Jul-1961 ORDERING CLINICIAN: Melvyn Novas, MD  REFERRING CLINICIAN: Naomie Dean, MD    CLINICAL INFORMATION/HISTORY:  04-21-2023, patient with OSA on CPAP.  04-21-2023:  Patricia Mathews is a 62 y.o. female patient who is seen upon referral on 04/21/2023 from Husband Dr Elna Breslow, after a hiatus of 5 years:   Chief concern according to patient : "I need a new machine after 5 years ".   DATE OF last  RECORDING:        02/02/2018 REFERRING M.D.:                 Naomie Dean, MD Study Performed:   Baseline Polysomnogram HISTORY:  Patricia Mathews is a 62 y.o. female patient, seen in referral from Dr. Lucia Gaskins for an evaluation of snoring and possible sleep apnea.   Dr. Lucia Gaskins is working her up for headaches with vertigo and nystagmus, stabbing pain sensation, feeling pain that radiates down her spine and occurs weekly. She was diagnosed with Lupus and Sjogren's syndrome over 10 years ago while living in [place]. Her husband, who is trained as an emergency room physician, has stated that she snores. sleep quality was lower before she received hormone replacement therapy. The patient endorsed the Epworth Sleepiness Scale at 9/24 points and FSS at 26/63 points, GDS 2/ 15 points.  The patient's weight 194 pounds with a height of 65 (inches), resulting in a BMI of 32.3 kg/m2.The patient's neck circumference measured 14.5 inches.   Patricia Mathews had returned  for a CPAP titration study following a PSG from 02/02/2018. The study revealed an AHI of 27.9/h, REM AHI of 48.3, and SpO2 nadir of 79%. Severe PLMs were recorded.  The patient's weight 194 pounds with a height of 65 (inches), resulting in a BMI of 32.3 kg/m2. The patient's neck circumference measured 14.5 inches.    Epworth sleepiness score: 01 /24.  FSS now at  16/ 63 , GDS 2/ 15.   BMI: 30.8  kg/m   Neck Circumference: 14.5   FINDINGS:   Sleep Summary:   Total Recording Time (hours, min):   9 hours 59 minutes  Total Sleep Time (hours, min):    7 hours 42 minutes             Percent REM (%):     10.2%                                   Respiratory Indices by AASM criteria:   Calculated pAHI (per hour):       30.2/h                      REM pAHI:      45.4/h                                           NREM pAHI:    28.4/h                          Positional AHI: The AHI in supine position was 35.5/h and in nonsupine position 23.1/h.  Snoring reached a mean volume of 42 dB and was present for 38% of total sleep time at time this snoring volume above 60 dB.                                                 Oxygen Saturation Statistics:       O2 Saturation Range (%):     Between a nadir at 85% of the maximal saturation of 99% with a mean saturation of 95%                                  O2 Saturation (minutes) <89%:     0.8 minutes      Pulse Rate Statistics:   Pulse Mean (bpm):   68 bpm              Pulse Range:   Between 50 and 104 bpm              IMPRESSION:  This HST confirms the presence of severe sleep apnea of obstructive origin.  No central apneas were detected or calculated by this home sleep test device.  The patient would be asked to continue CPAP therapy her current machine is 63 years old and will be replaced with a machine with similar settings.  She can choose a new mask if she desires or continue the model she is currently using.   RECOMMENDATION: auto- titration capable CPAP device with a pressure between 6 and 16 cm water pressure, 2 cm ER, heated humidification and mask of  patient's choice.     INTERPRETING PHYSICIAN:   Melvyn Novas, MD

## 2023-05-25 NOTE — Addendum Note (Signed)
Addended by: Melvyn Novas on: 05/25/2023 06:22 PM   Modules accepted: Orders

## 2023-05-26 ENCOUNTER — Telehealth: Payer: Self-pay

## 2023-05-26 NOTE — Telephone Encounter (Signed)
-----   Message from [place] Dohmeier sent at 05/25/2023  6:22 PM EDT ----- Confirmed persistent severe OSA and need for CPAP therapy to continue. Order to DME is written.

## 2023-05-26 NOTE — Telephone Encounter (Signed)
Called patient and informed her per Dr Dohmeier "Confirmed persistent severe OSA and need for CPAP therapy to continue. Order to DME is written." Pt verbalized understanding. Pt had no questions at this time but was encouraged to call back if questions arise.

## 2023-06-18 DIAGNOSIS — G4733 Obstructive sleep apnea (adult) (pediatric): Secondary | ICD-10-CM | POA: Diagnosis not present

## 2023-06-24 ENCOUNTER — Other Ambulatory Visit (HOSPITAL_COMMUNITY): Payer: Self-pay

## 2023-06-24 ENCOUNTER — Other Ambulatory Visit: Payer: Self-pay | Admitting: Emergency Medicine

## 2023-06-24 MED ORDER — TAMSULOSIN HCL 0.4 MG PO CAPS
0.4000 mg | ORAL_CAPSULE | Freq: Every day | ORAL | 3 refills | Status: DC
  Filled 2023-06-24: qty 90, 90d supply, fill #0

## 2023-06-25 ENCOUNTER — Telehealth: Payer: Self-pay | Admitting: Neurology

## 2023-06-25 NOTE — Telephone Encounter (Signed)
Pt was scheduled for his initial CPAP on (08/25/23) Pt was informed to bring machine and power cord to the appointment.   DME and between dates are :[facility] between dates are:07/20/23-09/19/23

## 2023-07-07 ENCOUNTER — Other Ambulatory Visit: Payer: Self-pay

## 2023-07-19 DIAGNOSIS — G4733 Obstructive sleep apnea (adult) (pediatric): Secondary | ICD-10-CM | POA: Diagnosis not present

## 2023-07-23 ENCOUNTER — Ambulatory Visit (HOSPITAL_BASED_OUTPATIENT_CLINIC_OR_DEPARTMENT_OTHER): Payer: Commercial Managed Care - PPO | Admitting: Orthopaedic Surgery

## 2023-07-23 ENCOUNTER — Ambulatory Visit (INDEPENDENT_AMBULATORY_CARE_PROVIDER_SITE_OTHER): Payer: Commercial Managed Care - PPO

## 2023-07-23 DIAGNOSIS — M25562 Pain in left knee: Secondary | ICD-10-CM

## 2023-07-23 DIAGNOSIS — M1712 Unilateral primary osteoarthritis, left knee: Secondary | ICD-10-CM | POA: Diagnosis not present

## 2023-07-23 DIAGNOSIS — G8929 Other chronic pain: Secondary | ICD-10-CM

## 2023-07-23 MED ORDER — LIDOCAINE HCL 1 % IJ SOLN
4.0000 mL | INTRAMUSCULAR | Status: AC | PRN
Start: 2023-07-23 — End: 2023-07-23
  Administered 2023-07-23: 4 mL

## 2023-07-23 MED ORDER — TRIAMCINOLONE ACETONIDE 40 MG/ML IJ SUSP
80.0000 mg | INTRAMUSCULAR | Status: AC | PRN
Start: 2023-07-23 — End: 2023-07-23
  Administered 2023-07-23: 80 mg via INTRA_ARTICULAR

## 2023-07-23 NOTE — Progress Notes (Signed)
Post Operative Evaluation    Procedure/Date of Surgery: left knee medial root 1/31  Interval History:   Patricia Mathews presents today for follow-up of her meniscal root repair.  She is still experiencing pain particularly about the medial joint line.  This has been worse when she is getting more active.  She is here today for further discussion.  She is experiencing swelling in the knee.   PMH/PSH/Family History/Social History/Meds/Allergies:    Past Medical History:  Diagnosis Date  . Dyspnea   . Dysrhythmia   . Endometrial polyp   . Family history of adverse reaction to anesthesia    Patient states her mother had an adverse reaction to anesthesia but she is not sure exactly what it was  . Fibromyalgia   . GAD (generalized anxiety disorder)   . History of panic attacks   . Hypertension    followed by pcp  . Lung cancer [facility])    & surgery  . Medical history non-contributory   . Neuropathy   . OSA on CPAP    followed by dr dohmeier--- per study 02-02-2018 moderate to severe osa  . Right bundle branch block   . Sjogren's disease (HCC)   . Systemic lupus (HCC)    rheumonotologist-- dr Fontaine No   Past Surgical History:  Procedure Laterality Date  . CESAREAN SECTION  1986;  1989  . CHONDROPLASTY Left 12/09/2021   Procedure: LEFT KNEE MEDIAL ROOT REPAIR, FEMORAL CHONDROPLASTY;  Surgeon: Huel Cote, MD;  Location: [facility];  Service: Orthopedics;  Laterality: Left;  general with regional block  . COLONOSCOPY WITH PROPOFOL  10/2012  . DILATATION & CURETTAGE/HYSTEROSCOPY WITH MYOSURE N/A 11/05/2020   Procedure: DILATATION & CURETTAGE/HYSTEROSCOPY WITH MYOSURE;  Surgeon: Genia Del, MD;  Location: [facility];  Service: Gynecology;  Laterality: N/A;  request to follow in [place] [facility] block time ~10:00am requests one hour  . FIDUCIAL MARKER PLACEMENT  09/11/2021   Procedure: FIDUCIAL DYE MARKING;  Surgeon:  Josephine Igo, DO;  Location: [facility];  Service: Pulmonary;;  . INTERCOSTAL NERVE BLOCK Right 03/26/2021   Procedure: INTERCOSTAL NERVE BLOCK;  Surgeon: Corliss Skains, MD;  Location: [facility];  Service: Thoracic;  Laterality: Right;  . INTERCOSTAL NERVE BLOCK Right 09/11/2021   Procedure: INTERCOSTAL NERVE BLOCK;  Surgeon: Corliss Skains, MD;  Location: [facility];  Service: Thoracic;  Laterality: Right;  . LUNG SURGERY     twice, robotic  . LYMPH NODE DISSECTION  03/26/2021   Procedure: LYMPH NODE DISSECTION;  Surgeon: Corliss Skains, MD;  Location: [facility];  Service: Thoracic;;  . LYMPH NODE DISSECTION Right 09/11/2021   Procedure: LYMPH NODE DISSECTION;  Surgeon: Corliss Skains, MD;  Location: [facility];  Service: Thoracic;  Laterality: Right;  . thorascopy Right    Robotic Assisted  . VIDEO BRONCHOSCOPY WITH ENDOBRONCHIAL NAVIGATION N/A 09/11/2021   Procedure: VIDEO BRONCHOSCOPY WITH ENDOBRONCHIAL NAVIGATION;  Surgeon: Josephine Igo, DO;  Location: [facility];  Service: Pulmonary;  Laterality: N/A;   Social History   Socioeconomic History  . Marital status: Married    Spouse name: Not on file  . Number of children: 2  . Years of education: post-grad  . Highest education level: Not on file  Occupational History  . Not on file  Tobacco Use  .  Smoking status: Former    Current packs/day: 0.00    Types: Cigarettes    Start date: 54    Quit date: 1989    Years since quitting: 35.7  . Smokeless tobacco: Never  Vaping Use  . Vaping status: Never Used  Substance and Sexual Activity  . Alcohol use: Yes    Comment: occasional- social  . Drug use: Never  . Sexual activity: Yes    Partners: Male    Birth control/protection: Post-menopausal    Comment: 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17, PARTNERS- 3, MARRIED- 3 YRS   Other Topics Concern  . Not on file  Social History Narrative   Lives at home with her husband   Right handed   2 cups of caffeine daily   Social  Determinants of Health   Financial Resource Strain: Not on file  Food Insecurity: Not on file  Transportation Needs: Not on file  Physical Activity: Not on file  Stress: Not on file  Social Connections: Not on file   Family History  Problem Relation Age of Onset  . Hypertension Mother   . Osteoarthritis Mother   . Cancer Father        PROSTATE  . Hypertension Father   . Heart failure Father   . Lung disease Father   . Breast cancer Sister   . Hypertension Sister   . Lupus Sister   . Sarcoidosis Paternal Aunt   . Cancer Brother        PROSTATE  . Lupus Brother   . Hypertension Brother   . Hypertension Brother   . Hypertension Brother    Allergies  Allergen Reactions  . Penicillins Anaphylaxis   Current Outpatient Medications  Medication Sig Dispense Refill  . ALPRAZolam (XANAX) 0.25 MG tablet Take 1 tablet (0.25 mg total) by mouth 2 (two) times daily as needed for anxiety. 30 tablet 3  . DULoxetine (CYMBALTA) 30 MG capsule Take 1 capsule (30 mg total) by mouth 2 (two) times daily. 180 capsule 3  . losartan-hydrochlorothiazide (HYZAAR) 50-12.5 MG tablet Take 1 tablet by mouth daily. 90 tablet 3  . MELATONIN PO Take 1 capsule by mouth at bedtime as needed (sleep).    . rosuvastatin (CRESTOR) 10 MG tablet Take 1 tablet (10 mg total) by mouth daily. 90 tablet 3  . tamsulosin (FLOMAX) 0.4 MG CAPS capsule Take 1 capsule (0.4 mg total) by mouth daily. 90 capsule 3  . UNABLE TO FIND Med Name: advil/tylenol    . VITAMIN D PO Take by mouth.     No current facility-administered medications for this visit.   No results found.  Review of Systems:   A ROS was performed including pertinent positives and negatives as documented in the HPI.   Musculoskeletal Exam:    There were no vitals taken for this visit.  Incisions well-healed.  Range of motion is from 0 to 135 degrees without pain.  Tenderness about the medial joint line with positive McMurray's maneuver.  Trace  swelling.  Imaging:    X-ray 4 views left knee: Mild medial joint space narrowing with small peripheral osteophyte  I personally reviewed and interpreted the radiographs.   Assessment:   62 year old female status post left medial meniscal root repair now with some residual pain medially.  Given the fact that she would like some initial pain relief I have recommended ultrasound-guided medial knee injection on the left.  That being said I do believe an MRI would be indicated in order to rule out  any type of underlying retear of the meniscus versus progression of osteoarthritis.  I did briefly discuss the possibility of partial knee replacement although this is somewhat premature given her pending MRI. Plan :    -Left knee ultrasound-guided injection performed with verbal consent obtained    Procedure Note  Patient: Patricia Mathews             Date of Birth: Sep 27, 1961           MRN: [id number]             Visit Date: 07/23/2023  Procedures: Visit Diagnoses:  1. Chronic pain of left knee     Large Joint Inj: L knee on 07/23/2023 3:35 PM Indications: pain Details: 22 G 1.5 in needle, ultrasound-guided anterior approach  Arthrogram: No  Medications: 4 mL lidocaine 1 %; 80 mg triamcinolone acetonide 40 MG/ML Outcome: tolerated well, no immediate complications Procedure, treatment alternatives, risks and benefits explained, specific risks discussed. Consent was given by the patient. Immediately prior to procedure a time out was called to verify the correct patient, procedure, equipment, support staff and site/side marked as required. Patient was prepped and draped in the usual sterile fashion.         I personally saw and evaluated the patient, and participated in the management and treatment plan.  Huel Cote, MD Attending Physician, Orthopedic Surgery  This document was dictated using Dragon voice recognition software. A reasonable attempt at proof reading has been  made to minimize errors.

## 2023-08-18 DIAGNOSIS — G4733 Obstructive sleep apnea (adult) (pediatric): Secondary | ICD-10-CM | POA: Diagnosis not present

## 2023-08-19 ENCOUNTER — Other Ambulatory Visit: Payer: Self-pay

## 2023-08-20 ENCOUNTER — Other Ambulatory Visit: Payer: Self-pay

## 2023-08-20 ENCOUNTER — Ambulatory Visit
Admission: RE | Admit: 2023-08-20 | Discharge: 2023-08-20 | Disposition: A | Discharge status: Home / Self Care | Payer: Commercial Managed Care - PPO | Source: Ambulatory Visit | Attending: Orthopaedic Surgery | Admitting: Orthopaedic Surgery

## 2023-08-20 DIAGNOSIS — M25562 Pain in left knee: Secondary | ICD-10-CM | POA: Diagnosis not present

## 2023-08-20 DIAGNOSIS — G8929 Other chronic pain: Secondary | ICD-10-CM | POA: Diagnosis not present

## 2023-08-24 NOTE — Progress Notes (Unsigned)
Patient: Patricia Mathews Date of Birth: [birth year]  Reason for Visit: Follow up History from: Patient Primary Neurologist: Dohmeier   ASSESSMENT AND PLAN 62 y.o. year old female   1.  OSA on CPAP  -Superb compliance!  I will increase pressure range from 6-16 cm to 6-18 cm to allow for more pressure for patient comfort, AHI 5.4.  Continue nightly CPAP usage.  Recommend minimum of 4 hours nightly utilization.  Continue current settings.  Will send an order to DME to send supplies as needed.  I will follow-up with her in about 5 weeks via MyChart and pull a new CPAP download.  Otherwise I will see her back for annual visit in 1 year.  HISTORY OF PRESENT ILLNESS: Today 08/25/23 Here for initial CPAP visit.  Prior CPAP user, consultation with Dr. Vickey Huger in June 2024 to receive a new CPAP machine.  HST 05/18/2023 showed continued severe sleep apnea.  Calculated pAHI 30.2/H.  Doing excellent with new CPAP machine.  She loves it.  Goes to bed around 7 PM, sleeps till 6 AM.  100% compliance, 6-16 cm.  Leak 4.0, AHI 5.4.  Does think she may need more pressure. Using FFM.  ESS 2.  Official CPAP report is attached.  04/21/23 Dr. Vickey Huger HISTORY:  Patricia Mathews is a 62 y.o. female patient, seen in referral from Dr. Lucia Gaskins for an evaluation of snoring and possible sleep apnea.   Dr. Lucia Gaskins is working her up for headaches with vertigo and nystagmus, stabbing pain sensation, feeling pain that radiates down her spine and occurs weekly. She was diagnosed with Lupus and Sjogren's syndrome over 10 years ago while living in [place]. Her husband, who is trained as an emergency room physician, has stated that she snores. She is post- menopausal, sleep quality was lower before she received hormone replacement therapy. The patient endorsed the Epworth Sleepiness Scale at 9/24 points and FSS at 26/63 points, GDS 2/ 15 points.  The patient's weight 194 pounds with a height of 65 (inches), resulting in a BMI of  32.3 kg/m2.The patient's neck circumference measured 14.5 inches.   [name] had returned  for a CPAP titration study following a PSG from 02/02/2018. The study revealed an AHI of 27.9/h, REM AHI of 48.3, and SpO2 nadir of 79%. Severe PLMs were recorded.    The patient endorsed the Epworth Sleepiness Scale at 9/24 points and the Fatigue Score at 26 points.   The patient's weight 194 pounds with a height of 65 (inches), resulting in a BMI of 32.3 kg/m2. The patient's neck circumference measured 14.5 inches.   CURRENT MEDICATIONS: Xanax, Cymbalta, Prinivil, Prometrium   CPAP was initiated at 5 cmH20 with heated humidity per AASM split night standards and pressure was advanced to 14 cmH20 because of hypopneas, apneas and desaturations.  At a PAP pressure of 14 cmH20, there was a reduction of the AHI to 0.0 with improvement of the above symptoms of obstructive sleep apnea.     I have the pleasure of seeing Patricia Mathews 04/21/23 a right-handed female with an internal history of slow progressing lung cancer, several lung surgeries last 2023 carcinoid nodular tumor, , biopsies,  possible sleep disorder.  She has had one knee repair surgery 2023.   The patient had the first sleep study in the year 2019 with EDS- excessive daytime sleepiness.   with a result of an AHI ( Apnea Hypopnea index)  of OSA.    The patient sees my colleague  Dr. Lucia Gaskins, who is working her up for headaches with vertigo and with established nystagmus.  She also has a stabbing pain sensation.  Is feeling pain that radiates down her spine and occurs weekly.  She has not identified triggers.  Her vertigo can also occur with and without a headache.  There is no family history of migraines.  She has noted up to 18 headache days a month.  Her husband, who is trained as an emergency room physician, has stated that she snores significantly.     She has also suffered from extreme fatigue, sleepiness, malaise and she has had drop  attacks.  She was diagnosed with lupus and Sjogren's syndrome over 10 years ago while living in [place].  She moved from [place], [place] about a year ago to the [place] area with her MD husband , who is practicing as a family Radio broadcast assistant.  She could not tolerate Plaquenil been first treated, prednisone Tapers have made her migraines worse, but she uses low-dose estrogen which helps hot flashes and helps her sleep.  She had felt some relief with Cymbalta.  She also noted dry eyes and dry mouth- but this does not necessarily feel worse in the morning.   Sleep habits are as follows: [name] reports that she may stay up watching TV until her bedtime around 10 PM if her husband goes to work next day.  If he does not have to go to work next day may actually watch Netflix up to 1 AM. She has no TV in the bedroom- she watches in her den.  Once in bed, she prays and goes promptly to sleep.  The patient's bedroom is cool, quiet and dark.  She has a bedroom with her husband, prefers to sleep on her right side and only uses one pillow.  The mattress is flat in a nonadjustable bed.  She usually is able to sleep through for several hours before she wakes,   often she does not even have a bathroom break at night. If she doesn't set her alarm and has not to get up in the morning she has no difficulties to sleep until 10 or 11 AM the next day.  She craves more sleep, wakes up achy but not tired.  Unfortunately she often has headaches in the morning this can be down the neck radiating sharp stabbing sensations or throbbing global headaches.  She usually is not nauseated, not dizzy.  Some headaches wake her out of sleep, sharp stabbing, migraine or cluster type.      Sleep medical history and family sleep history: no neck injury, normal cervical spine MRI. Brain MRI.  Brother with Sjoegrens, lupus and , 2 brothers and father with cancer ( prostate). Son had uveitis once while still in high school.  Social history:   [place] born Daughter of a cardiologist, married to Administrator, arts, parents living. 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   2 adult children with grandchildren,  She has been smoking until 27 years ago, 1992, while her daughter was in preschool.  Drinks wine, or whisky sour. 1-2 a week. Caffeine : coffee in AM and noon- 6  espressi doppio ! Soda - rarely,  Some times iced tea, no energy drinks.      Sleep habits are as follows: The patient's dinner time is between 5.30- PM. The patient goes to bed at 7.30 PM and continues to sleep for 7-8 hours, wakes for 2-3 bathroom breaks, the first time at  12 midnight, 2.30 AM and again 5 AM.  Daughter calls her at 7.30 AM.   The preferred sleep position is laterally, with the support of 1-2 pillows.  Dreams are reportedly frequent/vivid.   7.30 AM is the usual rise time. She reports feeling refreshed and restored in AM, without symptoms such as dry mouth, no longer having morning headaches, and no more residual fatigue on CPAP. Naps are taken very rarely, lasting from 15 to 40 minutes.  REVIEW OF SYSTEMS: Out of a complete 14 system review of symptoms, the patient complains only of the following symptoms, and all other reviewed systems are negative.  See HPI  ALLERGIES: Allergies  Allergen Reactions   Penicillins Anaphylaxis    HOME MEDICATIONS: Outpatient Medications Prior to Visit  Medication Sig Dispense Refill   ALPRAZolam (XANAX) 0.25 MG tablet Take 1 tablet (0.25 mg total) by mouth 2 (two) times daily as needed for anxiety. 30 tablet 3   DULoxetine (CYMBALTA) 30 MG capsule Take 1 capsule (30 mg total) by mouth 2 (two) times daily. 180 capsule 3   losartan-hydrochlorothiazide (HYZAAR) 50-12.5 MG tablet Take 1 tablet by mouth daily. 90 tablet 3   MELATONIN PO Take 1 capsule by mouth at bedtime as needed (sleep).     rosuvastatin (CRESTOR) 10 MG tablet Take 1 tablet (10 mg total) by mouth daily. 90 tablet 3   VITAMIN D PO  Take by mouth.     tamsulosin (FLOMAX) 0.4 MG CAPS capsule Take 1 capsule (0.4 mg total) by mouth daily. 90 capsule 3   UNABLE TO FIND Med Name: advil/tylenol     No facility-administered medications prior to visit.    PAST MEDICAL HISTORY: Past Medical History:  Diagnosis Date   Dyspnea    Dysrhythmia    Endometrial polyp    Family history of adverse reaction to anesthesia    Patient states her mother had an adverse reaction to anesthesia but she is not sure exactly what it was   Fibromyalgia    GAD (generalized anxiety disorder)    History of panic attacks    Hypertension    followed by pcp   Lung cancer [facility])    & surgery   Medical history non-contributory    Neuropathy    OSA on CPAP    followed by dr dohmeier--- per study 02-02-2018 moderate to severe osa   Right bundle branch block    Sjogren's disease (HCC)    Systemic lupus (HCC)    rheumonotologist-- dr aNickola Major    PAST SURGICAL HISTORY: Past Surgical History:  Procedure Laterality Date   CESAREAN SECTION  1986;  1989   CHONDROPLASTY Left 12/09/2021   Procedure: LEFT KNEE MEDIAL ROOT REPAIR, FEMORAL CHONDROPLASTY;  Surgeon: Huel Cote, MD;  Location: [facility];  Service: Orthopedics;  Laterality: Left;  general with regional block   COLONOSCOPY WITH PROPOFOL  10/2012   DILATATION & CURETTAGE/HYSTEROSCOPY WITH MYOSURE N/A 11/05/2020   Procedure: DILATATION & CURETTAGE/HYSTEROSCOPY WITH MYOSURE;  Surgeon: Genia Del, MD;  Location: [facility];  Service: Gynecology;  Laterality: N/A;  request to follow in [place] [facility] block time ~10:00am requests one hour   FIDUCIAL MARKER PLACEMENT  09/11/2021   Procedure: FIDUCIAL DYE MARKING;  Surgeon: Josephine Igo, DO;  [facility];  Service: Pulmonary;;   INTERCOSTAL NERVE BLOCK Right 03/26/2021   Procedure: INTERCOSTAL NERVE BLOCK;  Surgeon: Corliss Skains, MD;  [facility]: [facility];  Service: Thoracic;   Laterality: Right;  INTERCOSTAL NERVE BLOCK Right 09/11/2021   Procedure: INTERCOSTAL NERVE BLOCK;  Surgeon: Corliss Skains, MD;  [facility]: [facility];  Service: Thoracic;  Laterality: Right;   LUNG SURGERY     twice, robotic   LYMPH NODE DISSECTION  03/26/2021   Procedure: LYMPH NODE DISSECTION;  Surgeon: Corliss Skains, MD;  [facility]: [facility];  Service: Thoracic;;   LYMPH NODE DISSECTION Right 09/11/2021   Procedure: LYMPH NODE DISSECTION;  Surgeon: Corliss Skains, MD;  [facility];  Service: Thoracic;  Laterality: Right;   thorascopy Right    Robotic Assisted   VIDEO BRONCHOSCOPY WITH ENDOBRONCHIAL NAVIGATION N/A 09/11/2021   Procedure: VIDEO BRONCHOSCOPY WITH ENDOBRONCHIAL NAVIGATION;  Surgeon: Josephine Igo, DO;  [facility];  Service: Pulmonary;  Laterality: N/A;    FAMILY HISTORY: Family History  Problem Relation Age of Onset   Hypertension Mother    Osteoarthritis Mother    Cancer Father        PROSTATE   Hypertension Father    Heart failure Father    Lung disease Father    Breast cancer Sister    Hypertension Sister    Lupus Sister    Sarcoidosis Paternal Aunt    Cancer Brother        PROSTATE   Lupus Brother    Hypertension Brother    Hypertension Brother    Hypertension Brother     SOCIAL HISTORY: Social History   Socioeconomic History   Marital status: Married    Spouse name: Not on file   Number of children: 2   Years of education: post-grad   Highest education level: Not on file  Occupational History   Not on file  Tobacco Use   Smoking status: Former    Current packs/day: 0.00    Types: Cigarettes    Start date: 1979    Quit date: 1989    Years since quitting: 35.8   Smokeless tobacco: Never  Vaping Use   Vaping status: Never Used  Substance and Sexual Activity   Alcohol use: Yes    Comment: occasional- social   Drug use: Never   Sexual activity: Yes    Partners: Male    Birth control/protection:  Post-menopausal    Comment: 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17, PARTNERS- 3, MARRIED- 34 YRS   Other Topics Concern   Not on file  Social History Narrative   Lives at home with her husband   Right handed   2 cups of caffeine daily   Social Determinants of Health   Financial Resource Strain: Not on file  Food Insecurity: Not on file  Transportation Needs: Not on file  Physical Activity: Not on file  Stress: Not on file  Social Connections: Not on file  Intimate Partner Violence: Not on file    PHYSICAL EXAM  Vitals:   08/25/23 1251  BP: (!) 147/79  Pulse: 85  Weight: 193 lb (87.5 kg)  Height: 5\' 5"  (1.651 m)   Body mass index is 32.12 kg/m.  Generalized: Well developed, in no acute distress  Neurological examination  Mentation: Alert oriented to time, place, history taking. Follows all commands speech and language fluent Motor: Moves all extremities independent Gait and station: Gait is normal.   DIAGNOSTIC DATA (LABS, IMAGING, TESTING) - I reviewed patient records, labs, notes, testing and imaging myself where available.  Lab Results  Component Value Date   WBC 6.8 10/30/2022   HGB 13.2 10/30/2022   HCT 40.3 10/30/2022   MCV 93 10/30/2022  PLT 361 10/30/2022      Component Value Date/Time   NA 139 10/30/2022 0816   K 4.1 10/30/2022 0816   CL 98 10/30/2022 0816   CO2 23 10/30/2022 0816   GLUCOSE 92 10/30/2022 0816   GLUCOSE 119 (H) 05/01/2022 0918   BUN 14 10/30/2022 0816   CREATININE 0.89 10/30/2022 0816   CREATININE 0.90 05/01/2022 0918   CALCIUM 9.6 10/30/2022 0816   PROT 7.5 10/30/2022 0816   ALBUMIN 4.6 10/30/2022 0816   AST 19 10/30/2022 0816   AST 18 05/01/2022 0918   ALT 34 (H) 10/30/2022 0816   ALT 24 05/01/2022 0918   ALKPHOS 96 10/30/2022 0816   BILITOT 0.6 10/30/2022 0816   BILITOT 0.5 05/01/2022 0918   GFRNONAA >60 05/01/2022 0918   GFRAA 104 10/11/2020 0834   GFRAA >60 01/26/2018 1245   Lab Results  Component Value Date   CHOL 153 10/30/2022    HDL 84 10/30/2022   LDLCALC 52 10/30/2022   TRIG 97 10/30/2022   Lab Results  Component Value Date   HGBA1C 5.7 (H) 10/30/2022   Lab Results  Component Value Date   VITAMINB12 537 10/11/2020   Lab Results  Component Value Date   TSH 1.150 10/11/2020    Margie Ege, AGNP-C, DNP 08/25/2023, 1:03 PM [facility] [address], [address] [place], [place] [address] [phone]

## 2023-08-25 ENCOUNTER — Encounter: Payer: Self-pay | Admitting: Neurology

## 2023-08-25 ENCOUNTER — Ambulatory Visit: Payer: Commercial Managed Care - PPO | Admitting: Neurology

## 2023-08-25 ENCOUNTER — Telehealth: Payer: Self-pay

## 2023-08-25 VITALS — BP 147/79 | HR 85 | Ht 65.0 in | Wt 193.0 lb

## 2023-08-25 DIAGNOSIS — G4733 Obstructive sleep apnea (adult) (pediatric): Secondary | ICD-10-CM | POA: Diagnosis not present

## 2023-08-25 NOTE — Telephone Encounter (Signed)
-----   Message from Glean Salvo sent at 08/25/2023  1:48 PM EDT ----- Order to DME for pressure change.  Thanks

## 2023-08-25 NOTE — Telephone Encounter (Signed)
Cpap order sent by community msg thru epic to [facility]

## 2023-08-25 NOTE — Patient Instructions (Signed)
I will increase your pressure range from 6-16 cm to 6-18 cm, will follow up with my chart message in 5 weeks to re-evaluate your pressure range, apnea events.

## 2023-08-27 ENCOUNTER — Ambulatory Visit (HOSPITAL_BASED_OUTPATIENT_CLINIC_OR_DEPARTMENT_OTHER): Payer: Commercial Managed Care - PPO | Admitting: Orthopaedic Surgery

## 2023-08-30 ENCOUNTER — Other Ambulatory Visit (HOSPITAL_COMMUNITY): Payer: Self-pay

## 2023-09-18 ENCOUNTER — Other Ambulatory Visit: Payer: Self-pay | Admitting: Emergency Medicine

## 2023-09-18 DIAGNOSIS — G4733 Obstructive sleep apnea (adult) (pediatric): Secondary | ICD-10-CM | POA: Diagnosis not present

## 2023-09-18 MED ORDER — FLUTICASONE PROPIONATE HFA 44 MCG/ACT IN AERO: 2.0000 | INHALATION_SPRAY | Freq: Two times a day (BID) | RESPIRATORY_TRACT | 5 refills | Status: DC

## 2023-09-23 ENCOUNTER — Other Ambulatory Visit (HOSPITAL_COMMUNITY): Payer: Self-pay

## 2023-09-23 ENCOUNTER — Other Ambulatory Visit: Payer: Self-pay | Admitting: Emergency Medicine

## 2023-09-23 DIAGNOSIS — R399 Unspecified symptoms and signs involving the genitourinary system: Secondary | ICD-10-CM

## 2023-09-23 MED ORDER — TAMSULOSIN HCL 0.4 MG PO CAPS
0.4000 mg | ORAL_CAPSULE | Freq: Every day | ORAL | 3 refills | Status: DC
Start: 2023-09-23 — End: 2024-08-31
  Filled 2023-09-23: qty 90, 90d supply, fill #0
  Filled 2023-12-16: qty 90, 90d supply, fill #1
  Filled 2024-03-15: qty 90, 90d supply, fill #2

## 2023-09-29 ENCOUNTER — Encounter: Payer: Self-pay | Admitting: Neurology

## 2023-10-08 ENCOUNTER — Other Ambulatory Visit: Payer: Self-pay | Admitting: Emergency Medicine

## 2023-10-08 MED ORDER — AMOXICILLIN-POT CLAVULANATE 875-125 MG PO TABS: 1.0000 | ORAL_TABLET | Freq: Two times a day (BID) | ORAL | 1 refills | Status: AC

## 2023-10-15 ENCOUNTER — Ambulatory Visit (HOSPITAL_BASED_OUTPATIENT_CLINIC_OR_DEPARTMENT_OTHER): Payer: Commercial Managed Care - PPO | Admitting: Orthopaedic Surgery

## 2023-10-18 ENCOUNTER — Other Ambulatory Visit: Payer: Self-pay | Admitting: Thoracic Surgery (Cardiothoracic Vascular Surgery)

## 2023-10-18 DIAGNOSIS — G4733 Obstructive sleep apnea (adult) (pediatric): Secondary | ICD-10-CM | POA: Diagnosis not present

## 2023-10-18 DIAGNOSIS — D3A090 Benign carcinoid tumor of the bronchus and lung: Secondary | ICD-10-CM

## 2023-10-22 ENCOUNTER — Ambulatory Visit (HOSPITAL_BASED_OUTPATIENT_CLINIC_OR_DEPARTMENT_OTHER): Payer: Commercial Managed Care - PPO | Admitting: Orthopaedic Surgery

## 2023-10-22 DIAGNOSIS — S83241A Other tear of medial meniscus, current injury, right knee, initial encounter: Secondary | ICD-10-CM

## 2023-10-22 DIAGNOSIS — S83242A Other tear of medial meniscus, current injury, left knee, initial encounter: Secondary | ICD-10-CM

## 2023-10-22 NOTE — Progress Notes (Signed)
Post Operative Evaluation    Procedure/Date of Surgery: left knee medial root 1/31  Interval History:   Patricia Mathews presents today for follow-up status post left knee MRI.  She is continue to have pain that is limiting her in basic activities or even for walking longer distances.   PMH/PSH/Family History/Social History/Meds/Allergies:    Past Medical History:  Diagnosis Date   Dyspnea    Dysrhythmia    Endometrial polyp    Family history of adverse reaction to anesthesia    Patient states her mother had an adverse reaction to anesthesia but she is not sure exactly what it was   Fibromyalgia    GAD (generalized anxiety disorder)    History of panic attacks    Hypertension    followed by pcp   Lung cancer [facility])    & surgery   Medical history non-contributory    Neuropathy    OSA on CPAP    followed by dr dohmeier--- per study 02-02-2018 moderate to severe osa   Right bundle branch block    Sjogren's disease (HCC)    Systemic lupus (HCC)    rheumonotologist-- dr Fontaine No   Past Surgical History:  Procedure Laterality Date   CESAREAN SECTION  1986;  1989   CHONDROPLASTY Left 12/09/2021   Procedure: LEFT KNEE MEDIAL ROOT REPAIR, FEMORAL CHONDROPLASTY;  Surgeon: Huel Cote, MD;  Location: [facility];  Service: Orthopedics;  Laterality: Left;  general with regional block   COLONOSCOPY WITH PROPOFOL  10/2012   DILATATION & CURETTAGE/HYSTEROSCOPY WITH MYOSURE N/A 11/05/2020   Procedure: DILATATION & CURETTAGE/HYSTEROSCOPY WITH MYOSURE;  Surgeon: Genia Del, MD;  Location: [facility];  Service: Gynecology;  Laterality: N/A;  request to follow in [place] [facility] block time ~10:00am requests one hour   FIDUCIAL MARKER PLACEMENT  09/11/2021   Procedure: FIDUCIAL DYE MARKING;  Surgeon: Josephine Igo, DO;  [facility];  Service: Pulmonary;;   INTERCOSTAL NERVE BLOCK Right 03/26/2021   Procedure:  INTERCOSTAL NERVE BLOCK;  Surgeon: Corliss Skains, MD;  [facility];  Service: Thoracic;  Laterality: Right;   INTERCOSTAL NERVE BLOCK Right 09/11/2021   Procedure: INTERCOSTAL NERVE BLOCK;  Surgeon: Corliss Skains, MD;  [facility];  Service: Thoracic;  Laterality: Right;   LUNG SURGERY     twice, robotic   LYMPH NODE DISSECTION  03/26/2021   Procedure: LYMPH NODE DISSECTION;  Surgeon: Corliss Skains, MD;  [facility];  Service: Thoracic;;   LYMPH NODE DISSECTION Right 09/11/2021   Procedure: LYMPH NODE DISSECTION;  Surgeon: Corliss Skains, MD;  [facility];  Service: Thoracic;  Laterality: Right;   thorascopy Right    Robotic Assisted   VIDEO BRONCHOSCOPY WITH ENDOBRONCHIAL NAVIGATION N/A 09/11/2021   Procedure: VIDEO BRONCHOSCOPY WITH ENDOBRONCHIAL NAVIGATION;  Surgeon: Josephine Igo, DO;  [facility];  Service: Pulmonary;  Laterality: N/A;   Social History   Socioeconomic History   Marital status: Married    Spouse name: Not on file   Number of children: 2   Years of education: post-grad   Highest education level: Not on file  Occupational History   Not on file  Tobacco Use   Smoking status: Former    Current packs/day: 0.00    Types: Cigarettes  Start date: 57    Quit date: 1989    Years since quitting: 35.9   Smokeless tobacco: Never  Vaping Use   Vaping status: Never Used  Substance and Sexual Activity   Alcohol use: Yes    Comment: occasional- social   Drug use: Never   Sexual activity: Yes    Partners: Male    Birth control/protection: Post-menopausal    Comment: 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17, PARTNERS- 3, MARRIED- 38 YRS   Other Topics Concern   Not on file  Social History Narrative   Lives at home with her husband   Right handed   2 cups of caffeine daily   Social Drivers of Corporate investment banker Strain: Not on file  Food Insecurity: Not on file  Transportation Needs: Not on file  Physical Activity:  Not on file  Stress: Not on file  Social Connections: Not on file   Family History  Problem Relation Age of Onset   Hypertension Mother    Osteoarthritis Mother    Cancer Father        PROSTATE   Hypertension Father    Heart failure Father    Lung disease Father    Breast cancer Sister    Hypertension Sister    Lupus Sister    Sarcoidosis Paternal Aunt    Cancer Brother        PROSTATE   Lupus Brother    Hypertension Brother    Hypertension Brother    Hypertension Brother    Allergies  Allergen Reactions   Penicillins Anaphylaxis   Current Outpatient Medications  Medication Sig Dispense Refill   ALPRAZolam (XANAX) 0.25 MG tablet Take 1 tablet (0.25 mg total) by mouth 2 (two) times daily as needed for anxiety. 30 tablet 3   DULoxetine (CYMBALTA) 30 MG capsule Take 1 capsule (30 mg total) by mouth 2 (two) times daily. 180 capsule 3   fluticasone (FLOVENT HFA) 44 MCG/ACT inhaler Inhale 2 puffs into the lungs 2 (two) times daily. 1 each 5   losartan-hydrochlorothiazide (HYZAAR) 50-12.5 MG tablet Take 1 tablet by mouth daily. 90 tablet 3   MELATONIN PO Take 1 capsule by mouth at bedtime as needed (sleep).     rosuvastatin (CRESTOR) 10 MG tablet Take 1 tablet (10 mg total) by mouth daily. 90 tablet 3   tamsulosin (FLOMAX) 0.4 MG CAPS capsule Take 1 capsule (0.4 mg total) by mouth daily. 90 capsule 3   VITAMIN D PO Take by mouth.     No current facility-administered medications for this visit.   No results found.  Review of Systems:   A ROS was performed including pertinent positives and negatives as documented in the HPI.   Musculoskeletal Exam:    There were no vitals taken for this visit.  Incisions well-healed.  Range of motion is from 0 to 135 degrees without pain.  Tenderness about the medial joint line with positive McMurray's maneuver.  Trace swelling.  Imaging:    X-ray 4 views left knee: Mild medial joint space narrowing with small peripheral  osteophyte  MRI left knee: There is evidence of moderate chondral loss consistent with progression of osteoarthritis in the setting of previous meniscal repair with mild meniscal extrusion  I personally reviewed and interpreted the radiographs.   Assessment:   62 year old female status post left medial meniscal root repair now with some residual pain medially.  I did discuss that her MRI does show evidence of meniscal extrusion with progression of arthritis.  Given this we did discuss treatment options.  I did discuss injectable options such as platelet rich plasma.  She would like to consider this.  We also did discuss the fact that she does have very limited involvement of the lateral meniscus as well as the patellofemoral joint.  Given this I do believe she would ultimately be a candidate for a medial unicompartmental knee arthroplasty.  At this time she would like to consider all of her options and send [place] a message after she is further discussed and consider the options. Plan :    -She will send [place] a message in MyChart January with her final decision    I personally saw and evaluated the patient, and participated in the management and treatment plan.  Huel Cote, MD Attending Physician, Orthopedic Surgery  This document was dictated using Dragon voice recognition software. A reasonable attempt at proof reading has been made to minimize errors.

## 2023-10-26 ENCOUNTER — Other Ambulatory Visit (HOSPITAL_COMMUNITY): Payer: Self-pay

## 2023-10-26 ENCOUNTER — Other Ambulatory Visit: Payer: Self-pay | Admitting: Emergency Medicine

## 2023-10-26 MED ORDER — ALPRAZOLAM 0.25 MG PO TABS
0.2500 mg | ORAL_TABLET | Freq: Two times a day (BID) | ORAL | 3 refills | Status: DC | PRN
  Filled 2023-10-26: qty 30, 15d supply, fill #0
  Filled 2023-11-23: qty 30, 15d supply, fill #1
  Filled 2024-01-26: qty 30, 15d supply, fill #2
  Filled 2024-03-15: qty 30, 15d supply, fill #3

## 2023-10-29 DIAGNOSIS — G4733 Obstructive sleep apnea (adult) (pediatric): Secondary | ICD-10-CM | POA: Diagnosis not present

## 2023-11-01 ENCOUNTER — Other Ambulatory Visit: Payer: Self-pay | Admitting: Emergency Medicine

## 2023-11-01 MED ORDER — CEFUROXIME AXETIL 250 MG PO TABS: 250.0000 mg | ORAL_TABLET | Freq: Two times a day (BID) | ORAL | 0 refills | Status: AC

## 2023-11-08 ENCOUNTER — Ambulatory Visit
Admission: RE | Admit: 2023-11-08 | Discharge: 2023-11-08 | Disposition: A | Discharge status: Home / Self Care | Payer: Commercial Managed Care - PPO | Source: Ambulatory Visit | Attending: Thoracic Surgery (Cardiothoracic Vascular Surgery) | Admitting: Thoracic Surgery (Cardiothoracic Vascular Surgery)

## 2023-11-08 DIAGNOSIS — D3A090 Benign carcinoid tumor of the bronchus and lung: Secondary | ICD-10-CM

## 2023-11-08 DIAGNOSIS — C7A090 Malignant carcinoid tumor of the bronchus and lung: Secondary | ICD-10-CM | POA: Diagnosis not present

## 2023-11-12 ENCOUNTER — Ambulatory Visit: Payer: Commercial Managed Care - PPO | Admitting: Thoracic Surgery (Cardiothoracic Vascular Surgery)

## 2023-11-12 VITALS — BP 149/88 | HR 92 | Resp 18 | Ht 65.0 in

## 2023-11-12 DIAGNOSIS — D3A090 Benign carcinoid tumor of the bronchus and lung: Secondary | ICD-10-CM | POA: Diagnosis not present

## 2023-11-12 DIAGNOSIS — Z9889 Other specified postprocedural states: Secondary | ICD-10-CM

## 2023-11-12 NOTE — Progress Notes (Signed)
     [address]       [place] [address]             [phone]                                        MARBETH SMEDLEY [facility] Medical Record #[id number] Date of Birth: Mar 09, 1961   Referring: Schaumann Emil Schanz, * Primary Care: Schaumann Emil Schanz, MD Primary Cardiologist:Traci Shlomo, MD   Reason for visit:   follow-up   History of Present Illness:     Mrs. Delancy presents for 30-month follow-up.  Overall she is doing well.  She is scheduled to have left knee surgery in the coming months.     Physical Exam: Vitals:   11/12/23 1015  BP: (!) 149/88  Pulse: 92  Resp: 18  SpO2: 92%       Alert NAD EWOB      Diagnostic Studies & Laboratory data: CT chest:    IMPRESSION: Stable postoperative changes at the right lung base. No evidence of recurrent or metastatic disease.   Assessment / Plan:   63 year old female status post wedge resection from 2 separate occasions for carcinoid tumor, and carcinoid tumorlet.  Overall she is doing well she will follow-up in 1 year with another chest CT.     Cleatus Gabriel MALVA Rayas

## 2023-11-15 ENCOUNTER — Telehealth: Payer: Self-pay | Admitting: Orthopaedic Surgery

## 2023-11-15 NOTE — Telephone Encounter (Signed)
 Patient saw Cancer Doctor and does not need another surgery. She is cancer free. Patient is ready to schedule left knee surgery for February. Please advise.

## 2023-11-18 DIAGNOSIS — G4733 Obstructive sleep apnea (adult) (pediatric): Secondary | ICD-10-CM | POA: Diagnosis not present

## 2023-11-23 ENCOUNTER — Other Ambulatory Visit (HOSPITAL_COMMUNITY): Payer: Self-pay

## 2023-11-23 ENCOUNTER — Other Ambulatory Visit: Payer: Self-pay

## 2023-11-23 ENCOUNTER — Other Ambulatory Visit: Payer: Self-pay | Admitting: Emergency Medicine

## 2023-11-23 DIAGNOSIS — I10 Essential (primary) hypertension: Secondary | ICD-10-CM

## 2023-11-23 MED ORDER — ROSUVASTATIN CALCIUM 10 MG PO TABS
10.0000 mg | ORAL_TABLET | Freq: Every day | ORAL | 3 refills | Status: AC
  Filled 2023-11-23: qty 90, 90d supply, fill #0
  Filled 2024-02-21: qty 90, 90d supply, fill #1
  Filled 2024-06-06: qty 90, 90d supply, fill #2
  Filled 2024-09-19: qty 90, 90d supply, fill #3

## 2023-11-23 MED ORDER — LOSARTAN POTASSIUM-HCTZ 50-12.5 MG PO TABS
1.0000 | ORAL_TABLET | Freq: Every day | ORAL | 3 refills | Status: DC
Start: 2023-11-23 — End: 2024-08-21
  Filled 2023-11-23: qty 90, 90d supply, fill #0
  Filled 2024-02-21: qty 90, 90d supply, fill #1
  Filled 2024-06-06: qty 90, 90d supply, fill #2

## 2023-11-26 ENCOUNTER — Other Ambulatory Visit: Payer: Self-pay | Admitting: Emergency Medicine

## 2023-11-26 MED ORDER — AIRSUPRA 90-80 MCG/ACT IN AERO: 2.0000 | INHALATION_SPRAY | Freq: Four times a day (QID) | RESPIRATORY_TRACT | 1 refills | Status: AC | PRN

## 2023-12-01 ENCOUNTER — Other Ambulatory Visit (HOSPITAL_BASED_OUTPATIENT_CLINIC_OR_DEPARTMENT_OTHER): Payer: Self-pay | Admitting: Orthopaedic Surgery

## 2023-12-01 DIAGNOSIS — S83242A Other tear of medial meniscus, current injury, left knee, initial encounter: Secondary | ICD-10-CM

## 2023-12-02 ENCOUNTER — Telehealth (HOSPITAL_BASED_OUTPATIENT_CLINIC_OR_DEPARTMENT_OTHER): Payer: Self-pay | Admitting: Physical Therapy

## 2023-12-02 NOTE — Telephone Encounter (Signed)
LVM requesting pt call back to confirm post-op physical therapy appts

## 2023-12-17 ENCOUNTER — Other Ambulatory Visit (HOSPITAL_COMMUNITY): Payer: Self-pay

## 2023-12-19 DIAGNOSIS — G4733 Obstructive sleep apnea (adult) (pediatric): Secondary | ICD-10-CM | POA: Diagnosis not present

## 2023-12-23 ENCOUNTER — Encounter (HOSPITAL_BASED_OUTPATIENT_CLINIC_OR_DEPARTMENT_OTHER): Payer: Self-pay | Admitting: Orthopaedic Surgery

## 2023-12-23 ENCOUNTER — Other Ambulatory Visit (HOSPITAL_BASED_OUTPATIENT_CLINIC_OR_DEPARTMENT_OTHER): Payer: Self-pay | Admitting: Orthopaedic Surgery

## 2023-12-23 ENCOUNTER — Other Ambulatory Visit (HOSPITAL_BASED_OUTPATIENT_CLINIC_OR_DEPARTMENT_OTHER): Payer: Self-pay

## 2023-12-23 DIAGNOSIS — G8918 Other acute postprocedural pain: Secondary | ICD-10-CM | POA: Diagnosis not present

## 2023-12-23 DIAGNOSIS — M1712 Unilateral primary osteoarthritis, left knee: Secondary | ICD-10-CM | POA: Diagnosis not present

## 2023-12-23 MED ORDER — ASPIRIN 325 MG PO TBEC
325.0000 mg | DELAYED_RELEASE_TABLET | Freq: Every day | ORAL | 0 refills | Status: DC
  Filled 2023-12-23: qty 14, 14d supply, fill #0

## 2023-12-23 MED ORDER — IBUPROFEN 800 MG PO TABS
800.0000 mg | ORAL_TABLET | Freq: Three times a day (TID) | ORAL | 0 refills | Status: AC
  Filled 2023-12-23: qty 30, 10d supply, fill #0

## 2023-12-23 MED ORDER — OXYCODONE HCL 5 MG PO TABS
5.0000 mg | ORAL_TABLET | ORAL | 0 refills | Status: DC | PRN
  Filled 2023-12-23: qty 20, 4d supply, fill #0

## 2023-12-23 MED ORDER — ACETAMINOPHEN 500 MG PO TABS
500.0000 mg | ORAL_TABLET | Freq: Three times a day (TID) | ORAL | 0 refills | Status: AC
  Filled 2023-12-23: qty 30, 10d supply, fill #0

## 2023-12-23 NOTE — Progress Notes (Signed)
Date of Surgery: 12/23/2023  INDICATIONS: Patricia Mathews is a 63 y.o.-year-old female with left knee medial unicompartmental oteoarthritis.  The risk and benefits of the procedure were discussed in detail and documented in the pre-operative evaluation.   PREOPERATIVE DIAGNOSIS: 1. Left knee medial osteoarthritis  POSTOPERATIVE DIAGNOSIS: Same.  PROCEDURE: 1. Left knee medial unicompartmental knee arthroplasty  SURGEON: Benancio Deeds MD  ASSISTANT: Ardeen Fillers, ATC  ANESTHESIA:  general plus adductor canal block  IV FLUIDS AND URINE: See anesthesia record.  ANTIBIOTICS: Ancef  ESTIMATED BLOOD LOSS: 25 mL.  IMPLANTS:  Arthrex unicompartmental knee, 3 femur, 2 tibia, 9 poly  DRAINS: None  CULTURES: None  COMPLICATIONS: none  DESCRIPTION OF PROCEDURE:  The patient was identified in the pre-operative holding area, marked, and consent completed.  Anesthesia was then performed with regional nerve block.  The patient was transferred to the operative suite and placed in the supine position with all bony prominences padded. SCDs were placed on the non-operative lower extremity. Appropriate antibiotics was administered within 1 hour before incision. The patient underwent general anesthesia with the anesthesia team. The right lower extremity was then prepped and draped in standard fashion.A time out was performed confirming the correct extremity, correct patient and correct procedures.   An anterior midline incision was marked and made just medial to the midline from just above the patella down to the tibial tubercle. Full-thickness flaps were raised and hemostasis maintained using electrocautery. We then performed a medial parapatellar arthrotomy taking care to preserve the parts of the joint that would not be altered, including ligaments and patellofemoral joint. We excised the fat pad and performed a medial peel on the tibia, noting grade IV changes on the femoral condyle and tibia.  Otherwise the patella and remaining articular cartilage demonstrated minimal degenerative wear and both the ACL and PCL were functioning and intact. The medial meniscus was resected at this point.  We then turned our attention to the tibia, placing the extramedullary reference guide in the appropriate position and adjusted it so that the appropriate amount of bone was resected at an angle recreating native slope. We then performed the cuts with a sagittal saw vertically at the medial spine, preserving ACL and PCL, followed by the transverse cut with the oscillating saw. Once we were happy with the cut we checked the gap sizing with a size 8mm block in flexion and extension and noted it to be appropriate.  We then turned our attention to the femur, setting up the distal femoral resection guide on the spacing block in extension, securing it with pins. The distal femoral cut was cut performed while protecting the notch and skin with retractors. Lastly the knee was flexed and the posterior resection guide was secured with pins and the cut was performed. We then used osteotomes and rongeurs to complete and finish all cuts. We then re-checked our gap balancing and noted it to be appropriate once more. We then sized the distal femur and then secured this with pins and cut the posterior chamfer and drilling the lugs.We next sized the tibia using sizing rings and then trialed all components with the 9mm insert. The knee was brought though its range of motion and found to be satisfactory in flexion/extension. We then removed the trials and inserted the guide to finalize the tibia with drill and keel, this was performed without complication.  We then irrigated all bony surfaces with pulse suction irrigator and dried them, and inspected cuts and soft tissues, noting  that all were appropriate and ready for implantation. We then proceeded with mixing cement and placing a 4x8 sponge at the posterior aspect of the knee, held  in place by a Z-retractor to prevent excess cement from passing in that direction.Once the cement was appropriately tacky we placed the appropriate amount on the tibial implant and placed the implant in the tibia, keeping it compressed and noting that it was sitting in the same position as previously trialed. Excess cement was removed and we irrigated and dried the femoral bone and mixed a new batch of cement. Once it was appropriately tacky we applied it to the femoral component and inserted the component, removed excess cement, and then inserted the polyethylene insert and extended the knee fully, compressing the knee while the cement hardened. We then inspected the knee and irrigated it, removing all excess cement and ranging it fully. We were happy with the motion of the knee, flexing to >130 and stable throughout. We then closed the arthrotomy with 0-vicryl, followed by 2-0 vicryl buried stitches at the skin and then skin staples. We cleaned and dried the wound, applied sterile dressings and let down the tourniquet.All counts were correct at the end of the case.The patient awoke from anesthesia without difficulty and was transferred to the PACU in stable condition.     POSTOPERATIVE PLAN: She will place on aspirin for 2 weeks for blood clot prevention. She will be weight bearing as tolerated. I will see her back in 2 weeks for suture removal.  Benancio Deeds, MD 2:39 PM

## 2023-12-25 ENCOUNTER — Other Ambulatory Visit: Payer: Self-pay | Admitting: Emergency Medicine

## 2023-12-25 ENCOUNTER — Ambulatory Visit (HOSPITAL_BASED_OUTPATIENT_CLINIC_OR_DEPARTMENT_OTHER): Payer: Commercial Managed Care - PPO | Attending: Orthopaedic Surgery | Admitting: Physical Therapy

## 2023-12-25 DIAGNOSIS — R262 Difficulty in walking, not elsewhere classified: Secondary | ICD-10-CM | POA: Diagnosis not present

## 2023-12-25 DIAGNOSIS — S83242A Other tear of medial meniscus, current injury, left knee, initial encounter: Secondary | ICD-10-CM | POA: Diagnosis not present

## 2023-12-25 DIAGNOSIS — M25562 Pain in left knee: Secondary | ICD-10-CM | POA: Diagnosis not present

## 2023-12-25 DIAGNOSIS — M25662 Stiffness of left knee, not elsewhere classified: Secondary | ICD-10-CM | POA: Insufficient documentation

## 2023-12-25 MED ORDER — ONDANSETRON 4 MG PO TBDP: 4.0000 mg | ORAL_TABLET | Freq: Three times a day (TID) | ORAL | 2 refills | Status: DC | PRN

## 2023-12-25 NOTE — Therapy (Unsigned)
 OUTPATIENT PHYSICAL THERAPY EVALUATION   Patient Name: Patricia Mathews MRN: [id number] DOB:October 06, 1961, 63 y.o., female Today's Date: 12/27/2023  END OF SESSION:  PT End of Session - 12/27/23 0742     Visit Number 1    Number of Visits 13    Date for PT Re-Evaluation 02/05/24    Authorization Type [facility] [employer]    PT Start Time 1030    PT Stop Time 1110    PT Time Calculation (min) 40 min    Activity Tolerance Patient tolerated treatment well    Behavior During Therapy WFL for tasks assessed/performed             Past Medical History:  Diagnosis Date   Dyspnea    Dysrhythmia    Endometrial polyp    Family history of adverse reaction to anesthesia    Patient states her mother had an adverse reaction to anesthesia but she is not sure exactly what it was   Fibromyalgia    GAD (generalized anxiety disorder)    History of panic attacks    Hypertension    followed by pcp   Lung cancer [facility])    & surgery   Medical history non-contributory    Neuropathy    OSA on CPAP    followed by dr dohmeier--- per study 02-02-2018 moderate to severe osa   Right bundle branch block    Sjogren's disease (HCC)    Systemic lupus (HCC)    rheumonotologist-- dr Fontaine No   Past Surgical History:  Procedure Laterality Date   CESAREAN SECTION  1986;  1989   CHONDROPLASTY Left 12/09/2021   Procedure: LEFT KNEE MEDIAL ROOT REPAIR, FEMORAL CHONDROPLASTY;  Surgeon: Huel Cote, MD;  Location:  SURGERY CENTER;  Service: Orthopedics;  Laterality: Left;  general with regional block   COLONOSCOPY WITH PROPOFOL  10/2012   DILATATION & CURETTAGE/HYSTEROSCOPY WITH MYOSURE N/A 11/05/2020   Procedure: DILATATION & CURETTAGE/HYSTEROSCOPY WITH MYOSURE;  Surgeon: Genia Del, MD;  Location: [facility];  Service: Gynecology;  Laterality: N/A;  request to follow in [place] [facility] block time ~10:00am requests one hour   FIDUCIAL MARKER PLACEMENT  09/11/2021   Procedure:  FIDUCIAL DYE MARKING;  Surgeon: Josephine Igo, DO;  [facility];  Service: Pulmonary;;   INTERCOSTAL NERVE BLOCK Right 03/26/2021   Procedure: INTERCOSTAL NERVE BLOCK;  Surgeon: Corliss Skains, MD;  [facility];  Service: Thoracic;  Laterality: Right;   INTERCOSTAL NERVE BLOCK Right 09/11/2021   Procedure: INTERCOSTAL NERVE BLOCK;  Surgeon: Corliss Skains, MD;  [facility];  Service: Thoracic;  Laterality: Right;   LUNG SURGERY     twice, robotic   LYMPH NODE DISSECTION  03/26/2021   Procedure: LYMPH NODE DISSECTION;  Surgeon: Corliss Skains, MD;  [facility];  Service: Thoracic;;   LYMPH NODE DISSECTION Right 09/11/2021   Procedure: LYMPH NODE DISSECTION;  Surgeon: Corliss Skains, MD;  [facility];  Service: Thoracic;  Laterality: Right;   thorascopy Right    Robotic Assisted   VIDEO BRONCHOSCOPY WITH ENDOBRONCHIAL NAVIGATION N/A 09/11/2021   Procedure: VIDEO BRONCHOSCOPY WITH ENDOBRONCHIAL NAVIGATION;  Surgeon: Josephine Igo, DO;  [facility];  Service: Pulmonary;  Laterality: N/A;   Patient Active Problem List   Diagnosis Date Noted   Acute medial meniscus tear of right knee    Plica of knee, left    Dyslipidemia 10/24/2021   Hypertension 10/24/2021   Neuropathic pain 10/24/2021   S/P  partial lobectomy of lung 09/11/2021   Carcinoid tumor    Malignant carcinoid tumor of the bronchus and lung (HCC) 04/18/2021   Drug induced constipation 04/18/2021   Right lower lobe pulmonary nodule 03/26/2021   Essential hypertension, benign 12/25/2019   Other forms of systemic lupus erythematosus (HCC) 12/25/2019   Sjogren's syndrome (HCC) 12/25/2019   GAD (generalized anxiety disorder) 12/25/2019   Obstructive sleep apnea treated with continuous positive airway pressure (CPAP) 09/01/2018   Sleep related headaches 01/03/2018   Snoring 01/03/2018   Hypersomnia with sleep apnea 01/03/2018   Arthralgia 01/03/2018   Multiple  neurological symptoms 12/28/2017      REFERRING PROVIDER:  Huel Cote, MD     REFERRING DIAG:  [phone] (ICD-10-CM) - Acute medial meniscal tear, left, initial encounter    s/o Lt uniarthro 2/13  Rationale for Evaluation and Treatment: Rehabilitation  THERAPY DIAG:  Acute pain of left knee  Stiffness of left knee, not elsewhere classified  Difficulty in walking, not elsewhere classified  ONSET DATE: DOS 2/13   SUBJECTIVE:                                                                                                                                                                                           SUBJECTIVE STATEMENT: It has been bleeding a lot, changed the bandages a few times.   PERTINENT HISTORY:  See PMH  PAIN:  Are you having pain? Yes: NPRS scale: moderate Pain location: Lt knee Pain description: sore Aggravating factors: constant Relieving factors: ice, massage  PRECAUTIONS:  None  RED FLAGS: None   WEIGHT BEARING RESTRICTIONS:  No  FALLS:  Has patient fallen in last 6 months? No  LIVING ENVIRONMENT: Lives with: lives with their spouse 2- story home but has full bath on main floor  PLOF:  Independent  PATIENT GOALS:  Back to normal   OBJECTIVE:  Note: Objective measures were completed at Evaluation unless otherwise noted.  PATIENT SURVEYS:  LEFS 7/80  COGNITIVE STATUS: Within functional limits for tasks assessed   EDEMA:  Yes: moderate vs nonsurgical knee   GAIT: Eval: arrived in [facility]    Body Part #1 Knee  EVAL: ROM lacking 10-15 deg extension, did not measure flexion due to incision. Incision bleeding but no s/s of infection  TREATMENT DATE:   EVAL: Changed bandages & discussed care with pt and husband See HEP   PATIENT EDUCATION:  Education details: Anatomy of condition, POC, HEP,  exercise form/rationale Person educated: Patient and Spouse Education method: Explanation, Demonstration, Tactile cues, Verbal cues, and Handouts Education comprehension: verbalized understanding, returned demonstration, verbal cues required, tactile cues required, and needs further education  HOME EXERCISE PROGRAM:  [email or web address]  Access Code: [email or web address]   ASSESSMENT:  CLINICAL IMPRESSION: Patient is a 63 y.o. F who was seen today for physical therapy evaluation and treatment for s/p Lt knee uniarthroplasty.      REHAB POTENTIAL: Good  CLINICAL DECISION MAKING: Stable/uncomplicated  EVALUATION COMPLEXITY: Low   GOALS: Goals reviewed with patient? Yes  SHORT TERM GOALS: Target date: 3/13  ROM 0-120 Baseline: Goal status: INITIAL  2.  Independent ambulation Baseline:  Goal status: INITIAL    LONG TERM GOALS: Target date: POC date  Navigate stairs step over step Baseline:  Goal status: INITIAL  2.  LEFS to 85% of max score Baseline:  Goal status: INITIAL  3.  Able to participate with grand kids with little to no limitation by knee Baseline:  Goal status: INITIAL  4.  Knee ext strength 90% of opposite LE Baseline:  Goal status: INITIAL    PLAN:  PT FREQUENCY: 1-2x/week  PT DURATION: POC date  PLANNED INTERVENTIONS: 97164- PT Re-evaluation, 97110-Therapeutic exercises, 97530- Therapeutic activity, 97112- Neuromuscular re-education, 97535- Self Care, 11914- Manual therapy, [phone]- Gait training, [phone]- Aquatic Therapy, [phone]- Electrical stimulation (unattended), 97016- Vasopneumatic device, Patient/Family education, Balance training, Stair training, Taping, Dry Needling, Joint mobilization, Spinal mobilization, Scar mobilization, Cryotherapy, and Moist heat.  PLAN FOR NEXT SESSION: ROM, edema management   Thelda Gagan C. Tawonna Esquer PT, DPT 12/27/23 12:52 PM

## 2023-12-27 ENCOUNTER — Encounter (HOSPITAL_BASED_OUTPATIENT_CLINIC_OR_DEPARTMENT_OTHER): Payer: Self-pay | Admitting: Physical Therapy

## 2023-12-29 ENCOUNTER — Other Ambulatory Visit (HOSPITAL_BASED_OUTPATIENT_CLINIC_OR_DEPARTMENT_OTHER): Payer: Self-pay | Admitting: Orthopaedic Surgery

## 2023-12-30 ENCOUNTER — Other Ambulatory Visit (HOSPITAL_BASED_OUTPATIENT_CLINIC_OR_DEPARTMENT_OTHER): Payer: Self-pay

## 2023-12-30 ENCOUNTER — Encounter (HOSPITAL_BASED_OUTPATIENT_CLINIC_OR_DEPARTMENT_OTHER): Payer: Self-pay | Admitting: Orthopaedic Surgery

## 2023-12-30 MED ORDER — OXYCODONE HCL 5 MG PO TABS
5.0000 mg | ORAL_TABLET | ORAL | 0 refills | Status: DC | PRN
  Filled 2023-12-30: qty 20, 4d supply, fill #0

## 2024-01-01 ENCOUNTER — Ambulatory Visit (HOSPITAL_BASED_OUTPATIENT_CLINIC_OR_DEPARTMENT_OTHER): Payer: Commercial Managed Care - PPO | Admitting: Physical Therapy

## 2024-01-01 ENCOUNTER — Encounter (HOSPITAL_BASED_OUTPATIENT_CLINIC_OR_DEPARTMENT_OTHER): Payer: Self-pay | Admitting: Physical Therapy

## 2024-01-01 DIAGNOSIS — S83242A Other tear of medial meniscus, current injury, left knee, initial encounter: Secondary | ICD-10-CM | POA: Diagnosis not present

## 2024-01-01 DIAGNOSIS — M25562 Pain in left knee: Secondary | ICD-10-CM

## 2024-01-01 DIAGNOSIS — M25662 Stiffness of left knee, not elsewhere classified: Secondary | ICD-10-CM | POA: Diagnosis not present

## 2024-01-01 DIAGNOSIS — R262 Difficulty in walking, not elsewhere classified: Secondary | ICD-10-CM | POA: Diagnosis not present

## 2024-01-01 NOTE — Therapy (Signed)
 OUTPATIENT PHYSICAL THERAPY TREATMENT   Patient Name: Patricia Mathews MRN: [id number] DOB:08/22/1961, 63 y.o., female Today's Date: 01/01/2024  END OF SESSION:  PT End of Session - 01/01/24 1057     Visit Number 2    Number of Visits 13    Date for PT Re-Evaluation 02/05/24    Authorization Type [facility] [employer]    PT Start Time 1046    PT Stop Time 1130    PT Time Calculation (min) 44 min    Activity Tolerance Patient tolerated treatment well    Behavior During Therapy WFL for tasks assessed/performed             Past Medical History:  Diagnosis Date   Dyspnea    Dysrhythmia    Endometrial polyp    Family history of adverse reaction to anesthesia    Patient states her mother had an adverse reaction to anesthesia but she is not sure exactly what it was   Fibromyalgia    GAD (generalized anxiety disorder)    History of panic attacks    Hypertension    followed by pcp   Lung cancer [facility])    & surgery   Medical history non-contributory    Neuropathy    OSA on CPAP    followed by dr dohmeier--- per study 02-02-2018 moderate to severe osa   Right bundle branch block    Sjogren's disease (HCC)    Systemic lupus (HCC)    rheumonotologist-- dr Fontaine No   Past Surgical History:  Procedure Laterality Date   CESAREAN SECTION  1986;  1989   CHONDROPLASTY Left 12/09/2021   Procedure: LEFT KNEE MEDIAL ROOT REPAIR, FEMORAL CHONDROPLASTY;  Surgeon: Huel Cote, MD;  Location: [facility];  Service: Orthopedics;  Laterality: Left;  general with regional block   COLONOSCOPY WITH PROPOFOL  10/2012   DILATATION & CURETTAGE/HYSTEROSCOPY WITH MYOSURE N/A 11/05/2020   Procedure: DILATATION & CURETTAGE/HYSTEROSCOPY WITH MYOSURE;  Surgeon: Genia Del, MD;  Location: [facility];  Service: Gynecology;  Laterality: N/A;  request to follow in [place] [facility] block time ~10:00am requests one hour   FIDUCIAL MARKER PLACEMENT  09/11/2021   Procedure:  FIDUCIAL DYE MARKING;  Surgeon: Josephine Igo, DO;  [facility];  Service: Pulmonary;;   INTERCOSTAL NERVE BLOCK Right 03/26/2021   Procedure: INTERCOSTAL NERVE BLOCK;  Surgeon: Corliss Skains, MD;  [facility];  Service: Thoracic;  Laterality: Right;   INTERCOSTAL NERVE BLOCK Right 09/11/2021   Procedure: INTERCOSTAL NERVE BLOCK;  Surgeon: Corliss Skains, MD;  [facility];  Service: Thoracic;  Laterality: Right;   LUNG SURGERY     twice, robotic   LYMPH NODE DISSECTION  03/26/2021   Procedure: LYMPH NODE DISSECTION;  Surgeon: Corliss Skains, MD;  [facility];  Service: Thoracic;;   LYMPH NODE DISSECTION Right 09/11/2021   Procedure: LYMPH NODE DISSECTION;  Surgeon: Corliss Skains, MD;  [facility];  Service: Thoracic;  Laterality: Right;   thorascopy Right    Robotic Assisted   VIDEO BRONCHOSCOPY WITH ENDOBRONCHIAL NAVIGATION N/A 09/11/2021   Procedure: VIDEO BRONCHOSCOPY WITH ENDOBRONCHIAL NAVIGATION;  Surgeon: Josephine Igo, DO;  [facility];  Service: Pulmonary;  Laterality: N/A;   Patient Active Problem List   Diagnosis Date Noted   Acute medial meniscus tear of right knee    Plica of knee, left    Dyslipidemia 10/24/2021   Hypertension 10/24/2021   Neuropathic pain 10/24/2021   S/P  partial lobectomy of lung 09/11/2021   Carcinoid tumor    Malignant carcinoid tumor of the bronchus and lung (HCC) 04/18/2021   Drug induced constipation 04/18/2021   Right lower lobe pulmonary nodule 03/26/2021   Essential hypertension, benign 12/25/2019   Other forms of systemic lupus erythematosus (HCC) 12/25/2019   Sjogren's syndrome (HCC) 12/25/2019   GAD (generalized anxiety disorder) 12/25/2019   Obstructive sleep apnea treated with continuous positive airway pressure (CPAP) 09/01/2018   Sleep related headaches 01/03/2018   Snoring 01/03/2018   Hypersomnia with sleep apnea 01/03/2018   Arthralgia 01/03/2018   Multiple  neurological symptoms 12/28/2017      REFERRING PROVIDER:  Huel Cote, MD     REFERRING DIAG:  [phone] (ICD-10-CM) - Acute medial meniscal tear, left, initial encounter    s/o Lt uniarthro 2/13  Rationale for Evaluation and Treatment: Rehabilitation  THERAPY DIAG:  Acute pain of left knee  Stiffness of left knee, not elsewhere classified  Difficulty in walking, not elsewhere classified  ONSET DATE: DOS 2/13   SUBJECTIVE:                                                                                                                                                                                           SUBJECTIVE STATEMENT: Walking without AD around house. +  PERTINENT HISTORY:  See PMH  PAIN:  Are you having pain? Yes: NPRS scale: moderate Pain location: Lt knee Pain description: sore Aggravating factors: constant Relieving factors: ice, massage  PRECAUTIONS:  None  RED FLAGS: None   WEIGHT BEARING RESTRICTIONS:  No  FALLS:  Has patient fallen in last 6 months? No  LIVING ENVIRONMENT: Lives with: lives with their spouse 2- story home but has full bath on main floor  PLOF:  Independent  PATIENT GOALS:  Back to normal   OBJECTIVE:  Note: Objective measures were completed at Evaluation unless otherwise noted.  PATIENT SURVEYS:  LEFS 7/80  COGNITIVE STATUS: Within functional limits for tasks assessed   EDEMA:  Yes: moderate vs nonsurgical knee   GAIT: Eval: arrived in [facility]    Body Part #1 Knee  EVAL: ROM lacking 10-15 deg extension, did not measure flexion due to incision. Incision bleeding but no s/s of infection  TREATMENT DATE:   2/22: Changed bandages Long sitting quad set with towel roll Sidelying hip abduction 3x15 Sidelying hip flexion/extension Seated hamstring stretch- able to achieve -14  deg Nu step 6 min Walking stairs   EVAL: Changed bandages & discussed care with pt and husband See HEP   PATIENT EDUCATION:  Education details: Anatomy of condition, POC, HEP, exercise form/rationale Person educated: Patient and Spouse Education method: Explanation, Demonstration, Tactile cues, Verbal cues, and Handouts Education comprehension: verbalized understanding, returned demonstration, verbal cues required, tactile cues required, and needs further education  HOME EXERCISE PROGRAM:  [email or web address]  Access Code: [email or web address]   ASSESSMENT:  CLINICAL IMPRESSION: Excellent progression in ADLs and gait pattern today with some discomfort. Advised to be aware of habits and patterns.      REHAB POTENTIAL: Good  CLINICAL DECISION MAKING: Stable/uncomplicated  EVALUATION COMPLEXITY: Low   GOALS: Goals reviewed with patient? Yes  SHORT TERM GOALS: Target date: 3/13  ROM 0-120 Baseline: Goal status: INITIAL  2.  Independent ambulation Baseline:  Goal status: INITIAL    LONG TERM GOALS: Target date: POC date  Navigate stairs step over step Baseline:  Goal status: INITIAL  2.  LEFS to 85% of max score Baseline:  Goal status: INITIAL  3.  Able to participate with grand kids with little to no limitation by knee Baseline:  Goal status: INITIAL  4.  Knee ext strength 90% of opposite LE Baseline:  Goal status: INITIAL    PLAN:  PT FREQUENCY: 1-2x/week  PT DURATION: POC date  PLANNED INTERVENTIONS: 97164- PT Re-evaluation, 97110-Therapeutic exercises, 97530- Therapeutic activity, 97112- Neuromuscular re-education, 97535- Self Care, 09811- Manual therapy, [phone]- Gait training, [phone]- Aquatic Therapy, [phone]- Electrical stimulation (unattended), 97016- Vasopneumatic device, Patient/Family education, Balance training, Stair training, Taping, Dry Needling, Joint mobilization, Spinal mobilization, Scar mobilization, Cryotherapy, and Moist  heat.  PLAN FOR NEXT SESSION: ROM, edema management   Verona Hartshorn C. Natalin Bible PT, DPT 01/01/24 11:49 AM

## 2024-01-06 NOTE — Therapy (Deleted)
 OUTPATIENT PHYSICAL THERAPY TREATMENT   Patient Name: Patricia Mathews MRN: [id number] DOB:Mar 28, 1961, 63 y.o., female Today's Date: 01/06/2024  END OF SESSION:    Past Medical History:  Diagnosis Date   Dyspnea    Dysrhythmia    Endometrial polyp    Family history of adverse reaction to anesthesia    Patient states her mother had an adverse reaction to anesthesia but she is not sure exactly what it was   Fibromyalgia    GAD (generalized anxiety disorder)    History of panic attacks    Hypertension    followed by pcp   Lung cancer [facility])    & surgery   Medical history non-contributory    Neuropathy    OSA on CPAP    followed by dr dohmeier--- per study 02-02-2018 moderate to severe osa   Right bundle branch block    Sjogren's disease (HCC)    Systemic lupus (HCC)    rheumonotologist-- dr Fontaine No   Past Surgical History:  Procedure Laterality Date   CESAREAN SECTION  1986;  1989   CHONDROPLASTY Left 12/09/2021   Procedure: LEFT KNEE MEDIAL ROOT REPAIR, FEMORAL CHONDROPLASTY;  Surgeon: Huel Cote, MD;  Location: [facility];  Service: Orthopedics;  Laterality: Left;  general with regional block   COLONOSCOPY WITH PROPOFOL  10/2012   DILATATION & CURETTAGE/HYSTEROSCOPY WITH MYOSURE N/A 11/05/2020   Procedure: DILATATION & CURETTAGE/HYSTEROSCOPY WITH MYOSURE;  Surgeon: Genia Del, MD;  Location: [facility] [place];  Service: Gynecology;  Laterality: N/A;  request to follow in [place] [facility] block time ~10:00am requests one hour   FIDUCIAL MARKER PLACEMENT  09/11/2021   Procedure: FIDUCIAL DYE MARKING;  Surgeon: Josephine Igo, DO;  [facility];  Service: Pulmonary;;   INTERCOSTAL NERVE BLOCK Right 03/26/2021   Procedure: INTERCOSTAL NERVE BLOCK;  Surgeon: Corliss Skains, MD;  [facility];  Service: Thoracic;  Laterality: Right;   INTERCOSTAL NERVE BLOCK Right 09/11/2021   Procedure: INTERCOSTAL NERVE BLOCK;   Surgeon: Corliss Skains, MD;  [facility];  Service: Thoracic;  Laterality: Right;   LUNG SURGERY     twice, robotic   LYMPH NODE DISSECTION  03/26/2021   Procedure: LYMPH NODE DISSECTION;  Surgeon: Corliss Skains, MD;  [facility];  Service: Thoracic;;   LYMPH NODE DISSECTION Right 09/11/2021   Procedure: LYMPH NODE DISSECTION;  Surgeon: Corliss Skains, MD;  [facility];  Service: Thoracic;  Laterality: Right;   thorascopy Right    Robotic Assisted   VIDEO BRONCHOSCOPY WITH ENDOBRONCHIAL NAVIGATION N/A 09/11/2021   Procedure: VIDEO BRONCHOSCOPY WITH ENDOBRONCHIAL NAVIGATION;  Surgeon: Josephine Igo, DO;  [facility];  Service: Pulmonary;  Laterality: N/A;   Patient Active Problem List   Diagnosis Date Noted   Acute medial meniscus tear of right knee    Plica of knee, left    Dyslipidemia 10/24/2021   Hypertension 10/24/2021   Neuropathic pain 10/24/2021   S/P partial lobectomy of lung 09/11/2021   Carcinoid tumor    Malignant carcinoid tumor of the bronchus and lung (HCC) 04/18/2021   Drug induced constipation 04/18/2021   Right lower lobe pulmonary nodule 03/26/2021   Essential hypertension, benign 12/25/2019   Other forms of systemic lupus erythematosus (HCC) 12/25/2019   Sjogren's syndrome (HCC) 12/25/2019   GAD (generalized anxiety disorder) 12/25/2019   Obstructive sleep apnea treated with continuous positive airway pressure (CPAP) 09/01/2018   Sleep related headaches 01/03/2018   Snoring 01/03/2018  Hypersomnia with sleep apnea 01/03/2018   Arthralgia 01/03/2018   Multiple neurological symptoms 12/28/2017      REFERRING PROVIDER:  Huel Cote, MD     REFERRING DIAG:  [phone] (ICD-10-CM) - Acute medial meniscal tear, left, initial encounter    s/o Lt uniarthro 2/13  Rationale for Evaluation and Treatment: Rehabilitation  THERAPY DIAG:  No diagnosis found.  ONSET DATE: DOS 2/13   SUBJECTIVE:                                                                                                                                                                                            SUBJECTIVE STATEMENT: Walking without AD around house. +  PERTINENT HISTORY:  See PMH  PAIN:  Are you having pain? Yes: NPRS scale: moderate Pain location: Lt knee Pain description: sore Aggravating factors: constant Relieving factors: ice, massage  PRECAUTIONS:  None  RED FLAGS: None   WEIGHT BEARING RESTRICTIONS:  No  FALLS:  Has patient fallen in last 6 months? No  LIVING ENVIRONMENT: Lives with: lives with their spouse 2- story home but has full bath on main floor  PLOF:  Independent  PATIENT GOALS:  Back to normal   OBJECTIVE:  Note: Objective measures were completed at Evaluation unless otherwise noted.  PATIENT SURVEYS:  LEFS 7/80  COGNITIVE STATUS: Within functional limits for tasks assessed   EDEMA:  Yes: moderate vs nonsurgical knee   GAIT: Eval: arrived in [facility]    Body Part #1 Knee  EVAL: ROM lacking 10-15 deg extension, did not measure flexion due to incision. Incision bleeding but no s/s of infection                                                                                                                            TREATMENT DATE:   2/22: Changed bandages Long sitting quad set with towel roll Sidelying hip abduction 3x15 Sidelying hip flexion/extension Seated hamstring stretch- able to achieve -14 deg Nu step 6 min Walking stairs   EVAL: Changed bandages & discussed care with pt and husband See HEP  PATIENT EDUCATION:  Education details: Teacher, music of condition, POC, HEP, exercise form/rationale Person educated: Patient and Spouse Education method: Explanation, Demonstration, Tactile cues, Verbal cues, and Handouts Education comprehension: verbalized understanding, returned demonstration, verbal cues required, tactile cues required, and needs further  education  HOME EXERCISE PROGRAM:  [email or web address]  Access Code: [email or web address]   ASSESSMENT:  CLINICAL IMPRESSION: Excellent progression in ADLs and gait pattern today with some discomfort. Advised to be aware of habits and patterns.      REHAB POTENTIAL: Good  CLINICAL DECISION MAKING: Stable/uncomplicated  EVALUATION COMPLEXITY: Low   GOALS: Goals reviewed with patient? Yes  SHORT TERM GOALS: Target date: 3/13  ROM 0-120 Baseline: Goal status: INITIAL  2.  Independent ambulation Baseline:  Goal status: INITIAL    LONG TERM GOALS: Target date: POC date  Navigate stairs step over step Baseline:  Goal status: INITIAL  2.  LEFS to 85% of max score Baseline:  Goal status: INITIAL  3.  Able to participate with grand kids with little to no limitation by knee Baseline:  Goal status: INITIAL  4.  Knee ext strength 90% of opposite LE Baseline:  Goal status: INITIAL    PLAN:  PT FREQUENCY: 1-2x/week  PT DURATION: POC date  PLANNED INTERVENTIONS: 97164- PT Re-evaluation, 97110-Therapeutic exercises, 97530- Therapeutic activity, 97112- Neuromuscular re-education, 97535- Self Care, 40981- Manual therapy, [phone]- Gait training, [phone]- Aquatic Therapy, [phone]- Electrical stimulation (unattended), 97016- Vasopneumatic device, Patient/Family education, Balance training, Stair training, Taping, Dry Needling, Joint mobilization, Spinal mobilization, Scar mobilization, Cryotherapy, and Moist heat.  PLAN FOR NEXT SESSION: ROM, edema management  Royal Hawthorn PT, DPT 01/06/24  7:12 AM

## 2024-01-07 ENCOUNTER — Ambulatory Visit (HOSPITAL_BASED_OUTPATIENT_CLINIC_OR_DEPARTMENT_OTHER): Payer: Commercial Managed Care - PPO

## 2024-01-07 ENCOUNTER — Ambulatory Visit (HOSPITAL_BASED_OUTPATIENT_CLINIC_OR_DEPARTMENT_OTHER): Payer: Commercial Managed Care - PPO | Admitting: Orthopaedic Surgery

## 2024-01-07 ENCOUNTER — Ambulatory Visit (HOSPITAL_BASED_OUTPATIENT_CLINIC_OR_DEPARTMENT_OTHER): Payer: Commercial Managed Care - PPO | Admitting: Physical Therapy

## 2024-01-07 ENCOUNTER — Encounter (HOSPITAL_BASED_OUTPATIENT_CLINIC_OR_DEPARTMENT_OTHER): Payer: Self-pay

## 2024-01-07 DIAGNOSIS — Z9889 Other specified postprocedural states: Secondary | ICD-10-CM | POA: Diagnosis not present

## 2024-01-07 DIAGNOSIS — S83242A Other tear of medial meniscus, current injury, left knee, initial encounter: Secondary | ICD-10-CM | POA: Diagnosis not present

## 2024-01-07 DIAGNOSIS — M25462 Effusion, left knee: Secondary | ICD-10-CM | POA: Diagnosis not present

## 2024-01-07 NOTE — Progress Notes (Signed)
 Post Operative Evaluation    Procedure/Date of Surgery: Left knee medial unicompartmental knee arthroplasty 2/13  Interval History:     Presents today 2 weeks status post left knee medial unicompartmental knee arthroplasty.  Overall she is working with physical therapy and been able to begin ambulation with cane.  Overall she is continuing to improve daily.   PMH/PSH/Family History/Social History/Meds/Allergies:    Past Medical History:  Diagnosis Date   Dyspnea    Dysrhythmia    Endometrial polyp    Family history of adverse reaction to anesthesia    Patient states her mother had an adverse reaction to anesthesia but she is not sure exactly what it was   Fibromyalgia    GAD (generalized anxiety disorder)    History of panic attacks    Hypertension    followed by pcp   Lung cancer [facility])    & surgery   Medical history non-contributory    Neuropathy    OSA on CPAP    followed by dr dohmeier--- per study 02-02-2018 moderate to severe osa   Right bundle branch block    Sjogren's disease (HCC)    Systemic lupus (HCC)    rheumonotologist-- dr Fontaine No   Past Surgical History:  Procedure Laterality Date   CESAREAN SECTION  1986;  1989   CHONDROPLASTY Left 12/09/2021   Procedure: LEFT KNEE MEDIAL ROOT REPAIR, FEMORAL CHONDROPLASTY;  Surgeon: Huel Cote, MD;  Location: [facility];  Service: Orthopedics;  Laterality: Left;  general with regional block   COLONOSCOPY WITH PROPOFOL  10/2012   DILATATION & CURETTAGE/HYSTEROSCOPY WITH MYOSURE N/A 11/05/2020   Procedure: DILATATION & CURETTAGE/HYSTEROSCOPY WITH MYOSURE;  Surgeon: Genia Del, MD;  Location: [facility] [place];  Service: Gynecology;  Laterality: N/A;  request to follow in [place] [facility] block time ~10:00am requests one hour   FIDUCIAL MARKER PLACEMENT  09/11/2021   Procedure: FIDUCIAL DYE MARKING;  Surgeon: Josephine Igo, DO;  [facility];  Service: Pulmonary;;   INTERCOSTAL NERVE BLOCK Right 03/26/2021   Procedure: INTERCOSTAL NERVE BLOCK;  Surgeon: Corliss Skains, MD;  [facility];  Service: Thoracic;  Laterality: Right;   INTERCOSTAL NERVE BLOCK Right 09/11/2021   Procedure: INTERCOSTAL NERVE BLOCK;  Surgeon: Corliss Skains, MD;  [facility];  Service: Thoracic;  Laterality: Right;   LUNG SURGERY     twice, robotic   LYMPH NODE DISSECTION  03/26/2021   Procedure: LYMPH NODE DISSECTION;  Surgeon: Corliss Skains, MD;  [facility];  Service: Thoracic;;   LYMPH NODE DISSECTION Right 09/11/2021   Procedure: LYMPH NODE DISSECTION;  Surgeon: Corliss Skains, MD;  [facility];  Service: Thoracic;  Laterality: Right;   thorascopy Right    Robotic Assisted   VIDEO BRONCHOSCOPY WITH ENDOBRONCHIAL NAVIGATION N/A 09/11/2021   Procedure: VIDEO BRONCHOSCOPY WITH ENDOBRONCHIAL NAVIGATION;  Surgeon: Josephine Igo, DO;  [facility];  Service: Pulmonary;  Laterality: N/A;   Social History   Socioeconomic History   Marital status: Married    Spouse name: Not on file   Number of children: 2   Years of education: post-grad   Highest education level: Not on file  Occupational History   Not on file  Tobacco Use   Smoking status: Former    Current  packs/day: 0.00    Types: Cigarettes    Start date: 15    Quit date: 1989    Years since quitting: 36.1   Smokeless tobacco: Never  Vaping Use   Vaping status: Never Used  Substance and Sexual Activity   Alcohol use: Yes    Comment: occasional- social   Drug use: Never   Sexual activity: Yes    Partners: Male    Birth control/protection: Post-menopausal    Comment: 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17, PARTNERS- 3, MARRIED- 44 YRS   Other Topics Concern   Not on file  Social History Narrative   Lives at home with her husband   Right handed   2 cups of caffeine daily   Social Drivers of Corporate investment banker Strain: Not on  file  Food Insecurity: Not on file  Transportation Needs: Not on file  Physical Activity: Not on file  Stress: Not on file  Social Connections: Not on file   Family History  Problem Relation Age of Onset   Hypertension Mother    Osteoarthritis Mother    Cancer Father        PROSTATE   Hypertension Father    Heart failure Father    Lung disease Father    Breast cancer Sister    Hypertension Sister    Lupus Sister    Sarcoidosis Paternal Aunt    Cancer Brother        PROSTATE   Lupus Brother    Hypertension Brother    Hypertension Brother    Hypertension Brother    Allergies  Allergen Reactions   Penicillins Anaphylaxis   Current Outpatient Medications  Medication Sig Dispense Refill   Albuterol-Budesonide (AIRSUPRA) 90-80 MCG/ACT AERO Inhale 2 puffs into the lungs every 6 (six) hours as needed. 5.9 g 1   ALPRAZolam (XANAX) 0.25 MG tablet Take 1 tablet (0.25 mg total) by mouth 2 (two) times daily as needed for anxiety. 30 tablet 3   aspirin EC 325 MG tablet Take 1 tablet (325 mg total) by mouth daily. 14 tablet 0   DULoxetine (CYMBALTA) 30 MG capsule Take 1 capsule (30 mg total) by mouth 2 (two) times daily. 180 capsule 3   fluticasone (FLOVENT HFA) 44 MCG/ACT inhaler Inhale 2 puffs into the lungs 2 (two) times daily. 1 each 5   losartan-hydrochlorothiazide (HYZAAR) 50-12.5 MG tablet Take 1 tablet by mouth daily. 90 tablet 3   MELATONIN PO Take 1 capsule by mouth at bedtime as needed (sleep).     ondansetron (ZOFRAN-ODT) 4 MG disintegrating tablet Take 1 tablet (4 mg total) by mouth every 8 (eight) hours as needed for nausea or vomiting. 20 tablet 2   oxyCODONE (ROXICODONE) 5 MG immediate release tablet Take 1 tablet (5 mg total) by mouth every 4 (four) hours as needed for severe pain (pain score 7-10) or breakthrough pain. 20 tablet 0   rosuvastatin (CRESTOR) 10 MG tablet Take 1 tablet (10 mg total) by mouth daily. 90 tablet 3   tamsulosin (FLOMAX) 0.4 MG CAPS capsule Take 1  capsule (0.4 mg total) by mouth daily. 90 capsule 3   VITAMIN D PO Take by mouth.     No current facility-administered medications for this visit.   No results found.  Review of Systems:   A ROS was performed including pertinent positives and negatives as documented in the HPI.   Musculoskeletal Exam:    There were no vitals taken for this visit.  Left knee incision is well-appearing  without erythema or drainage.  Range of motion is from 5 degrees to 100 degrees without pain.  Distal neurosensory exam is intact minimal swelling  Imaging:    3 views left knee: Status post medial unicompartmental knee arthroplasty without evidence of complication  I personally reviewed and interpreted the radiographs.   Assessment:   64 year old female who is 2 weeks status post left knee medial unicompartmental knee arthroplasty with doing well without evidence of complication.  This time she will continue to advance her weightbearing strength.  I will plan to see her back in 4 weeks for reassessment  Plan :    -Return to clinic 4 weeks for reassessment      I personally saw and evaluated the patient, and participated in the management and treatment plan.  Huel Cote, MD Attending Physician, Orthopedic Surgery  This document was dictated using Dragon voice recognition software. A reasonable attempt at proof reading has been made to minimize errors.

## 2024-01-11 ENCOUNTER — Ambulatory Visit (HOSPITAL_BASED_OUTPATIENT_CLINIC_OR_DEPARTMENT_OTHER): Payer: Commercial Managed Care - PPO | Admitting: Physical Therapy

## 2024-01-13 ENCOUNTER — Encounter (HOSPITAL_BASED_OUTPATIENT_CLINIC_OR_DEPARTMENT_OTHER): Payer: Commercial Managed Care - PPO | Admitting: Physical Therapy

## 2024-01-16 DIAGNOSIS — G4733 Obstructive sleep apnea (adult) (pediatric): Secondary | ICD-10-CM | POA: Diagnosis not present

## 2024-01-18 ENCOUNTER — Ambulatory Visit (HOSPITAL_BASED_OUTPATIENT_CLINIC_OR_DEPARTMENT_OTHER): Payer: Commercial Managed Care - PPO | Attending: Orthopaedic Surgery | Admitting: Physical Therapy

## 2024-01-18 ENCOUNTER — Encounter (HOSPITAL_BASED_OUTPATIENT_CLINIC_OR_DEPARTMENT_OTHER): Payer: Self-pay | Admitting: Physical Therapy

## 2024-01-18 DIAGNOSIS — M25662 Stiffness of left knee, not elsewhere classified: Secondary | ICD-10-CM | POA: Insufficient documentation

## 2024-01-18 DIAGNOSIS — M25562 Pain in left knee: Secondary | ICD-10-CM | POA: Insufficient documentation

## 2024-01-18 DIAGNOSIS — R262 Difficulty in walking, not elsewhere classified: Secondary | ICD-10-CM | POA: Insufficient documentation

## 2024-01-18 NOTE — Therapy (Signed)
 OUTPATIENT PHYSICAL THERAPY TREATMENT   Patient Name: Patricia Mathews MRN: [id number] DOB:November 22, 1960, 63 y.o., female Today's Date: 01/18/2024  END OF SESSION:  PT End of Session - 01/18/24 1009     Visit Number 3    Number of Visits 13    Date for PT Re-Evaluation 02/05/24    Authorization Type [facility] [employer]    PT Start Time 1010    PT Stop Time 1055    PT Time Calculation (min) 45 min    Activity Tolerance Patient tolerated treatment well    Behavior During Therapy WFL for tasks assessed/performed              Past Medical History:  Diagnosis Date   Dyspnea    Dysrhythmia    Endometrial polyp    Family history of adverse reaction to anesthesia    Patient states her mother had an adverse reaction to anesthesia but she is not sure exactly what it was   Fibromyalgia    GAD (generalized anxiety disorder)    History of panic attacks    Hypertension    followed by pcp   Lung cancer [facility])    & surgery   Medical history non-contributory    Neuropathy    OSA on CPAP    followed by dr dohmeier--- per study 02-02-2018 moderate to severe osa   Right bundle branch block    Sjogren's disease (HCC)    Systemic lupus (HCC)    rheumonotologist-- dr Fontaine No   Past Surgical History:  Procedure Laterality Date   CESAREAN SECTION  1986;  1989   CHONDROPLASTY Left 12/09/2021   Procedure: LEFT KNEE MEDIAL ROOT REPAIR, FEMORAL CHONDROPLASTY;  Surgeon: Huel Cote, MD;  Location: [facility];  Service: Orthopedics;  Laterality: Left;  general with regional block   COLONOSCOPY WITH PROPOFOL  10/2012   DILATATION & CURETTAGE/HYSTEROSCOPY WITH MYOSURE N/A 11/05/2020   Procedure: DILATATION & CURETTAGE/HYSTEROSCOPY WITH MYOSURE;  Surgeon: Genia Del, MD;  Location: [facility] [place];  Service: Gynecology;  Laterality: N/A;  request to follow in [place] [facility] block time ~10:00am requests one hour   FIDUCIAL MARKER PLACEMENT  09/11/2021   Procedure:  FIDUCIAL DYE MARKING;  Surgeon: Josephine Igo, DO;  [facility];  Service: Pulmonary;;   INTERCOSTAL NERVE BLOCK Right 03/26/2021   Procedure: INTERCOSTAL NERVE BLOCK;  Surgeon: Corliss Skains, MD;  [facility];  Service: Thoracic;  Laterality: Right;   INTERCOSTAL NERVE BLOCK Right 09/11/2021   Procedure: INTERCOSTAL NERVE BLOCK;  Surgeon: Corliss Skains, MD;  [facility];  Service: Thoracic;  Laterality: Right;   LUNG SURGERY     twice, robotic   LYMPH NODE DISSECTION  03/26/2021   Procedure: LYMPH NODE DISSECTION;  Surgeon: Corliss Skains, MD;  [facility];  Service: Thoracic;;   LYMPH NODE DISSECTION Right 09/11/2021   Procedure: LYMPH NODE DISSECTION;  Surgeon: Corliss Skains, MD;  [facility];  Service: Thoracic;  Laterality: Right;   thorascopy Right    Robotic Assisted   VIDEO BRONCHOSCOPY WITH ENDOBRONCHIAL NAVIGATION N/A 09/11/2021   Procedure: VIDEO BRONCHOSCOPY WITH ENDOBRONCHIAL NAVIGATION;  Surgeon: Josephine Igo, DO;  [facility];  Service: Pulmonary;  Laterality: N/A;   Patient Active Problem List   Diagnosis Date Noted   Acute medial meniscus tear of right knee    Plica of knee, left    Dyslipidemia 10/24/2021   Hypertension 10/24/2021   Neuropathic pain 10/24/2021  S/P partial lobectomy of lung 09/11/2021   Carcinoid tumor    Malignant carcinoid tumor of the bronchus and lung (HCC) 04/18/2021   Drug induced constipation 04/18/2021   Right lower lobe pulmonary nodule 03/26/2021   Essential hypertension, benign 12/25/2019   Other forms of systemic lupus erythematosus (HCC) 12/25/2019   Sjogren's syndrome (HCC) 12/25/2019   GAD (generalized anxiety disorder) 12/25/2019   Obstructive sleep apnea treated with continuous positive airway pressure (CPAP) 09/01/2018   Sleep related headaches 01/03/2018   Snoring 01/03/2018   Hypersomnia with sleep apnea 01/03/2018   Arthralgia 01/03/2018   Multiple  neurological symptoms 12/28/2017      REFERRING PROVIDER:  Huel Cote, MD     REFERRING DIAG:  [phone] (ICD-10-CM) - Acute medial meniscal tear, left, initial encounter    s/o Lt uniarthro 2/13  Rationale for Evaluation and Treatment: Rehabilitation  THERAPY DIAG:  Acute pain of left knee  Stiffness of left knee, not elsewhere classified  Difficulty in walking, not elsewhere classified  ONSET DATE: DOS 2/13   SUBJECTIVE:                                                                                                                                                                                           SUBJECTIVE STATEMENT: I went to the gym and did the nustep. It is feeling pretty good. I make myself do the stairs 4/day and then ice.   PERTINENT HISTORY:  See PMH  PAIN:  Are you having pain? Yes: NPRS scale: moderate Pain location: Lt knee Pain description: sore Aggravating factors: constant Relieving factors: ice, massage  PRECAUTIONS:  None  RED FLAGS: None   WEIGHT BEARING RESTRICTIONS:  No  FALLS:  Has patient fallen in last 6 months? No  LIVING ENVIRONMENT: Lives with: lives with their spouse 2- story home but has full bath on main floor  PLOF:  Independent  PATIENT GOALS:  Back to normal   OBJECTIVE:  Note: Objective measures were completed at Evaluation unless otherwise noted.  PATIENT SURVEYS:  LEFS 7/80 3/11: 60/80  COGNITIVE STATUS: Within functional limits for tasks assessed   EDEMA:  Yes: moderate vs nonsurgical knee   GAIT: Eval: arrived in [facility]    Body Part #1 Knee  EVAL: ROM lacking 10-15 deg extension, did not measure flexion due to incision. Incision bleeding but no s/s of infection 3/11 -6-105 deg (115 flexion with overpressure)  TREATMENT DATE:   3/11: Heel slide with strap 5x5  breath hold Lateral step 4" Tandem, with head turns SLS STM to Rt hip Supine piriformis stretch  2/22: Changed bandages Long sitting quad set with towel roll Sidelying hip abduction 3x15 Sidelying hip flexion/extension Seated hamstring stretch- able to achieve -14 deg Nu step 6 min Walking stairs   EVAL: Changed bandages & discussed care with pt and husband See HEP   PATIENT EDUCATION:  Education details: Teacher, music of condition, POC, HEP, exercise form/rationale Person educated: Patient and Spouse Education method: Explanation, Demonstration, Tactile cues, Verbal cues, and Handouts Education comprehension: verbalized understanding, returned demonstration, verbal cues required, tactile cues required, and needs further education  HOME EXERCISE PROGRAM:  [email or web address]  Access Code: [email or web address]   ASSESSMENT:  CLINICAL IMPRESSION: Excellent progress. Tightness in post knee but extension is improving. Discomfort in right hip due to overuse. STM to piriformis and glut med/min today decreased pain.      REHAB POTENTIAL: Good  CLINICAL DECISION MAKING: Stable/uncomplicated  EVALUATION COMPLEXITY: Low   GOALS: Goals reviewed with patient? Yes  SHORT TERM GOALS: Target date: 3/13  ROM 0-120 Baseline: Goal status: INITIAL  2.  Independent ambulation Baseline:  Goal status: achieved    LONG TERM GOALS: Target date: POC date  Navigate stairs step over step Baseline:  Goal status: INITIAL  2.  LEFS to 85% of max score Baseline:  Goal status: INITIAL  3.  Able to participate with grand kids with little to no limitation by knee Baseline:  Goal status: INITIAL  4.  Knee ext strength 90% of opposite LE Baseline:  Goal status: INITIAL    PLAN:  PT FREQUENCY: 1-2x/week  PT DURATION: POC date  PLANNED INTERVENTIONS: 97164- PT Re-evaluation, 97110-Therapeutic exercises, 97530- Therapeutic activity, 97112- Neuromuscular re-education, 97535-  Self Care, 44034- Manual therapy, [phone]- Gait training, [phone]- Aquatic Therapy, [phone]- Electrical stimulation (unattended), 97016- Vasopneumatic device, Patient/Family education, Balance training, Stair training, Taping, Dry Needling, Joint mobilization, Spinal mobilization, Scar mobilization, Cryotherapy, and Moist heat.  PLAN FOR NEXT SESSION: ROM, edema management Surie Suchocki C. Laniqua Torrens PT, DPT 01/18/24 10:58 AM

## 2024-01-20 ENCOUNTER — Ambulatory Visit (HOSPITAL_BASED_OUTPATIENT_CLINIC_OR_DEPARTMENT_OTHER): Payer: Commercial Managed Care - PPO

## 2024-01-25 ENCOUNTER — Encounter (HOSPITAL_BASED_OUTPATIENT_CLINIC_OR_DEPARTMENT_OTHER): Payer: Self-pay | Admitting: Physical Therapy

## 2024-01-25 ENCOUNTER — Ambulatory Visit (HOSPITAL_BASED_OUTPATIENT_CLINIC_OR_DEPARTMENT_OTHER): Payer: Commercial Managed Care - PPO | Admitting: Physical Therapy

## 2024-01-25 DIAGNOSIS — R262 Difficulty in walking, not elsewhere classified: Secondary | ICD-10-CM

## 2024-01-25 DIAGNOSIS — M25662 Stiffness of left knee, not elsewhere classified: Secondary | ICD-10-CM | POA: Diagnosis not present

## 2024-01-25 DIAGNOSIS — M25562 Pain in left knee: Secondary | ICD-10-CM | POA: Diagnosis not present

## 2024-01-25 NOTE — Therapy (Signed)
 OUTPATIENT PHYSICAL THERAPY TREATMENT   Patient Name: Patricia Mathews MRN: [id number] DOB:1961-02-25, 63 y.o., female Today's Date: 01/25/2024  END OF SESSION:  PT End of Session - 01/25/24 0931     Visit Number 4    Number of Visits 13    Date for PT Re-Evaluation 02/05/24    Authorization Type [facility] [employer]    PT Start Time 0930    PT Stop Time 1008    PT Time Calculation (min) 38 min    Activity Tolerance Patient tolerated treatment well    Behavior During Therapy WFL for tasks assessed/performed               Past Medical History:  Diagnosis Date   Dyspnea    Dysrhythmia    Endometrial polyp    Family history of adverse reaction to anesthesia    Patient states her mother had an adverse reaction to anesthesia but she is not sure exactly what it was   Fibromyalgia    GAD (generalized anxiety disorder)    History of panic attacks    Hypertension    followed by pcp   Lung cancer [facility])    & surgery   Medical history non-contributory    Neuropathy    OSA on CPAP    followed by dr dohmeier--- per study 02-02-2018 moderate to severe osa   Right bundle branch block    Sjogren's disease (HCC)    Systemic lupus (HCC)    rheumonotologist-- dr Fontaine No   Past Surgical History:  Procedure Laterality Date   CESAREAN SECTION  1986;  1989   CHONDROPLASTY Left 12/09/2021   Procedure: LEFT KNEE MEDIAL ROOT REPAIR, FEMORAL CHONDROPLASTY;  Surgeon: Huel Cote, MD;  Location: [facility];  Service: Orthopedics;  Laterality: Left;  general with regional block   COLONOSCOPY WITH PROPOFOL  10/2012   DILATATION & CURETTAGE/HYSTEROSCOPY WITH MYOSURE N/A 11/05/2020   Procedure: DILATATION & CURETTAGE/HYSTEROSCOPY WITH MYOSURE;  Surgeon: Genia Del, MD;  Location: [facility];  Service: Gynecology;  Laterality: N/A;  request to follow in [place] [facility] block time ~10:00am requests one hour   FIDUCIAL MARKER PLACEMENT  09/11/2021    Procedure: FIDUCIAL DYE MARKING;  Surgeon: Josephine Igo, DO;  [facility];  Service: Pulmonary;;   INTERCOSTAL NERVE BLOCK Right 03/26/2021   Procedure: INTERCOSTAL NERVE BLOCK;  Surgeon: Corliss Skains, MD;  [facility];  Service: Thoracic;  Laterality: Right;   INTERCOSTAL NERVE BLOCK Right 09/11/2021   Procedure: INTERCOSTAL NERVE BLOCK;  Surgeon: Corliss Skains, MD;  [facility];  Service: Thoracic;  Laterality: Right;   LUNG SURGERY     twice, robotic   LYMPH NODE DISSECTION  03/26/2021   Procedure: LYMPH NODE DISSECTION;  Surgeon: Corliss Skains, MD;  [facility];  Service: Thoracic;;   LYMPH NODE DISSECTION Right 09/11/2021   Procedure: LYMPH NODE DISSECTION;  Surgeon: Corliss Skains, MD;  [facility];  Service: Thoracic;  Laterality: Right;   thorascopy Right    Robotic Assisted   VIDEO BRONCHOSCOPY WITH ENDOBRONCHIAL NAVIGATION N/A 09/11/2021   Procedure: VIDEO BRONCHOSCOPY WITH ENDOBRONCHIAL NAVIGATION;  Surgeon: Josephine Igo, DO;  [facility];  Service: Pulmonary;  Laterality: N/A;   Patient Active Problem List   Diagnosis Date Noted   Acute medial meniscus tear of right knee    Plica of knee, left    Dyslipidemia 10/24/2021   Hypertension 10/24/2021   Neuropathic pain 10/24/2021  S/P partial lobectomy of lung 09/11/2021   Carcinoid tumor    Malignant carcinoid tumor of the bronchus and lung (HCC) 04/18/2021   Drug induced constipation 04/18/2021   Right lower lobe pulmonary nodule 03/26/2021   Essential hypertension, benign 12/25/2019   Other forms of systemic lupus erythematosus (HCC) 12/25/2019   Sjogren's syndrome (HCC) 12/25/2019   GAD (generalized anxiety disorder) 12/25/2019   Obstructive sleep apnea treated with continuous positive airway pressure (CPAP) 09/01/2018   Sleep related headaches 01/03/2018   Snoring 01/03/2018   Hypersomnia with sleep apnea 01/03/2018   Arthralgia 01/03/2018    Multiple neurological symptoms 12/28/2017      REFERRING PROVIDER:  Huel Cote, MD     REFERRING DIAG:  [phone] (ICD-10-CM) - Acute medial meniscal tear, left, initial encounter    s/o Lt uniarthro 2/13  Rationale for Evaluation and Treatment: Rehabilitation  THERAPY DIAG:  Acute pain of left knee  Stiffness of left knee, not elsewhere classified  Difficulty in walking, not elsewhere classified  ONSET DATE: DOS 2/13   SUBJECTIVE:                                                                                                                                                                                           SUBJECTIVE STATEMENT: Have been going to the gym and doing the exercises. Use my cane for support on stairs and when walking fast. Twice when walking my knee felt like it overextended and it hurt.   PERTINENT HISTORY:  See PMH  PAIN:  Are you having pain? Yes: NPRS scale: moderate Pain location: Lt knee Pain description: sore Aggravating factors: constant Relieving factors: ice, massage  PRECAUTIONS:  None  RED FLAGS: None   WEIGHT BEARING RESTRICTIONS:  No  FALLS:  Has patient fallen in last 6 months? No  LIVING ENVIRONMENT: Lives with: lives with their spouse 2- story home but has full bath on main floor  PLOF:  Independent  PATIENT GOALS:  Back to normal   OBJECTIVE:  Note: Objective measures were completed at Evaluation unless otherwise noted.  PATIENT SURVEYS:  LEFS 7/80 3/11: 60/80  COGNITIVE STATUS: Within functional limits for tasks assessed   EDEMA:  Yes: moderate vs nonsurgical knee   GAIT: Eval: arrived in [facility]    Body Part #1 Knee  EVAL: ROM lacking 10-15 deg extension, did not measure flexion due to incision. Incision bleeding but no s/s of infection 3/11 -6-105 deg (115 flexion with overpressure)  TREATMENT DATE:   3/18: STM Lt hip Supine figure 4 stretch Seated hip hinge Fast/slow walking without AD IASTM & STM to gastroc & post knee- more walking & gastroc stretch  3/11: Heel slide with strap 5x5 breath hold Lateral step 4" Tandem, with head turns SLS STM to Rt hip Supine piriformis stretch  2/22: Changed bandages Long sitting quad set with towel roll Sidelying hip abduction 3x15 Sidelying hip flexion/extension Seated hamstring stretch- able to achieve -14 deg Nu step 6 min Walking stairs    PATIENT EDUCATION:  Education details: Teacher, music of condition, POC, HEP, exercise form/rationale Person educated: Patient and Spouse Education method: Explanation, Demonstration, Actor cues, Verbal cues, and Handouts Education comprehension: verbalized understanding, returned demonstration, verbal cues required, tactile cues required, and needs further education  HOME EXERCISE PROGRAM:  [email or web address]  Access Code: [email or web address]   ASSESSMENT:  CLINICAL IMPRESSION: Concordant pain released from hip and post knee with STM today. Posture is more upright without quad cane and requested that she stop using it.      REHAB POTENTIAL: Good  CLINICAL DECISION MAKING: Stable/uncomplicated  EVALUATION COMPLEXITY: Low   GOALS: Goals reviewed with patient? Yes  SHORT TERM GOALS: Target date: 3/13  ROM 0-120 Baseline: lacking <5 deg Goal status: partially met  2.  Independent ambulation Baseline:  Goal status: achieved    LONG TERM GOALS: Target date: POC date  Navigate stairs step over step Baseline:  Goal status: INITIAL  2.  LEFS to 85% of max score Baseline:  Goal status: INITIAL  3.  Able to participate with grand kids with little to no limitation by knee Baseline:  Goal status: INITIAL  4.  Knee ext strength 90% of opposite LE Baseline:  Goal status: INITIAL    PLAN:  PT FREQUENCY: 1-2x/week  PT DURATION: POC  date  PLANNED INTERVENTIONS: 97164- PT Re-evaluation, 97110-Therapeutic exercises, 97530- Therapeutic activity, 97112- Neuromuscular re-education, 97535- Self Care, 09604- Manual therapy, [phone]- Gait training, [phone]- Aquatic Therapy, [phone]- Electrical stimulation (unattended), 97016- Vasopneumatic device, Patient/Family education, Balance training, Stair training, Taping, Dry Needling, Joint mobilization, Spinal mobilization, Scar mobilization, Cryotherapy, and Moist heat.  PLAN FOR NEXT SESSION: ROM, edema management, STM gastroc, CKC hip activation & balance Mekenzie Modeste C. Yamilette Garretson PT, DPT 01/25/24 10:10 AM

## 2024-01-26 ENCOUNTER — Other Ambulatory Visit: Payer: Self-pay

## 2024-01-27 ENCOUNTER — Encounter (HOSPITAL_BASED_OUTPATIENT_CLINIC_OR_DEPARTMENT_OTHER): Payer: Commercial Managed Care - PPO

## 2024-02-01 ENCOUNTER — Encounter (HOSPITAL_BASED_OUTPATIENT_CLINIC_OR_DEPARTMENT_OTHER): Payer: Self-pay

## 2024-02-01 ENCOUNTER — Ambulatory Visit (HOSPITAL_BASED_OUTPATIENT_CLINIC_OR_DEPARTMENT_OTHER): Payer: Commercial Managed Care - PPO

## 2024-02-03 ENCOUNTER — Encounter (HOSPITAL_BASED_OUTPATIENT_CLINIC_OR_DEPARTMENT_OTHER): Payer: Commercial Managed Care - PPO

## 2024-02-08 ENCOUNTER — Ambulatory Visit (HOSPITAL_BASED_OUTPATIENT_CLINIC_OR_DEPARTMENT_OTHER): Payer: Commercial Managed Care - PPO | Admitting: Physical Therapy

## 2024-02-10 ENCOUNTER — Encounter (HOSPITAL_BASED_OUTPATIENT_CLINIC_OR_DEPARTMENT_OTHER): Payer: Commercial Managed Care - PPO

## 2024-02-15 ENCOUNTER — Encounter (HOSPITAL_BASED_OUTPATIENT_CLINIC_OR_DEPARTMENT_OTHER): Payer: Self-pay | Admitting: Physical Therapy

## 2024-02-15 ENCOUNTER — Ambulatory Visit (HOSPITAL_BASED_OUTPATIENT_CLINIC_OR_DEPARTMENT_OTHER): Payer: Commercial Managed Care - PPO | Attending: Orthopaedic Surgery | Admitting: Physical Therapy

## 2024-02-15 DIAGNOSIS — R262 Difficulty in walking, not elsewhere classified: Secondary | ICD-10-CM | POA: Insufficient documentation

## 2024-02-15 DIAGNOSIS — M25562 Pain in left knee: Secondary | ICD-10-CM | POA: Diagnosis not present

## 2024-02-15 DIAGNOSIS — M25662 Stiffness of left knee, not elsewhere classified: Secondary | ICD-10-CM | POA: Diagnosis not present

## 2024-02-15 NOTE — Therapy (Signed)
 OUTPATIENT PHYSICAL THERAPY TREATMENT   Patient Name: Patricia Mathews MRN: [id number] DOB:May 08, 1961, 63 y.o., female Today's Date: 02/15/2024  END OF SESSION:  PT End of Session - 02/15/24 0800     Visit Number 5    Number of Visits 13    Date for PT Re-Evaluation 02/05/24    Authorization Type [facility] [employer]    PT Start Time 0800    PT Stop Time 0841    PT Time Calculation (min) 41 min    Activity Tolerance Patient tolerated treatment well    Behavior During Therapy WFL for tasks assessed/performed               Past Medical History:  Diagnosis Date   Dyspnea    Dysrhythmia    Endometrial polyp    Family history of adverse reaction to anesthesia    Patient states her mother had an adverse reaction to anesthesia but she is not sure exactly what it was   Fibromyalgia    GAD (generalized anxiety disorder)    History of panic attacks    Hypertension    followed by pcp   Lung cancer [facility]    & surgery   Medical history non-contributory    Neuropathy    OSA on CPAP    followed by dr dohmeier--- per study 02-02-2018 moderate to severe osa   Right bundle branch block    Sjogren's disease (HCC)    Systemic lupus (HCC)    rheumonotologist-- dr Fontaine No   Past Surgical History:  Procedure Laterality Date   CESAREAN SECTION  1986;  1989   CHONDROPLASTY Left 12/09/2021   Procedure: LEFT KNEE MEDIAL ROOT REPAIR, FEMORAL CHONDROPLASTY;  Surgeon: Huel Cote, MD;  Location: [facility];  Service: Orthopedics;  Laterality: Left;  general with regional block   COLONOSCOPY WITH PROPOFOL  10/2012   DILATATION & CURETTAGE/HYSTEROSCOPY WITH MYOSURE N/A 11/05/2020   Procedure: DILATATION & CURETTAGE/HYSTEROSCOPY WITH MYOSURE;  Surgeon: Genia Del, MD;  Location: [facility];  Service: Gynecology;  Laterality: N/A;  request to follow in [place] [facility] block time ~10:00am requests one hour   FIDUCIAL MARKER PLACEMENT  09/11/2021   Procedure:  FIDUCIAL DYE MARKING;  Surgeon: Josephine Igo, DO;  [facility];  Service: Pulmonary;;   INTERCOSTAL NERVE BLOCK Right 03/26/2021   Procedure: INTERCOSTAL NERVE BLOCK;  Surgeon: Corliss Skains, MD;  [facility];  Service: Thoracic;  Laterality: Right;   INTERCOSTAL NERVE BLOCK Right 09/11/2021   Procedure: INTERCOSTAL NERVE BLOCK;  Surgeon: Corliss Skains, MD;  [facility];  Service: Thoracic;  Laterality: Right;   LUNG SURGERY     twice, robotic   LYMPH NODE DISSECTION  03/26/2021   Procedure: LYMPH NODE DISSECTION;  Surgeon: Corliss Skains, MD;  [facility];  Service: Thoracic;;   LYMPH NODE DISSECTION Right 09/11/2021   Procedure: LYMPH NODE DISSECTION;  Surgeon: Corliss Skains, MD;  [facility];  Service: Thoracic;  Laterality: Right;   thorascopy Right    Robotic Assisted   VIDEO BRONCHOSCOPY WITH ENDOBRONCHIAL NAVIGATION N/A 09/11/2021   Procedure: VIDEO BRONCHOSCOPY WITH ENDOBRONCHIAL NAVIGATION;  Surgeon: Josephine Igo, DO;  [facility];  Service: Pulmonary;  Laterality: N/A;   Patient Active Problem List   Diagnosis Date Noted   Acute medial meniscus tear of right knee    Plica of knee, left    Dyslipidemia 10/24/2021   Hypertension 10/24/2021   Neuropathic pain 10/24/2021  S/P partial lobectomy of lung 09/11/2021   Carcinoid tumor    Malignant carcinoid tumor of the bronchus and lung (HCC) 04/18/2021   Drug induced constipation 04/18/2021   Right lower lobe pulmonary nodule 03/26/2021   Essential hypertension, benign 12/25/2019   Other forms of systemic lupus erythematosus (HCC) 12/25/2019   Sjogren's syndrome (HCC) 12/25/2019   GAD (generalized anxiety disorder) 12/25/2019   Obstructive sleep apnea treated with continuous positive airway pressure (CPAP) 09/01/2018   Sleep related headaches 01/03/2018   Snoring 01/03/2018   Hypersomnia with sleep apnea 01/03/2018   Arthralgia 01/03/2018   Multiple  neurological symptoms 12/28/2017      REFERRING PROVIDER:  Huel Cote, MD     REFERRING DIAG:  [phone] (ICD-10-CM) - Acute medial meniscal tear, left, initial encounter    s/o Lt uniarthro 2/13  Rationale for Evaluation and Treatment: Rehabilitation  THERAPY DIAG:  Acute pain of left knee  Stiffness of left knee, not elsewhere classified  Difficulty in walking, not elsewhere classified  ONSET DATE: DOS 2/13   SUBJECTIVE:                                                                                                                                                                                           SUBJECTIVE STATEMENT: My thigh is killing me, the knee doesn't hurt. My ankle and the ball in the back of the knee. I did 15 min on the treadmill.   PERTINENT HISTORY:  See PMH  PAIN:  Are you having pain? Yes: NPRS scale: moderate Pain location: Lt knee Pain description: sore Aggravating factors: constant Relieving factors: ice, massage  PRECAUTIONS:  None  RED FLAGS: None   WEIGHT BEARING RESTRICTIONS:  No  FALLS:  Has patient fallen in last 6 months? No  LIVING ENVIRONMENT: Lives with: lives with their spouse 2- story home but has full bath on main floor  PLOF:  Independent  PATIENT GOALS:  Back to normal   OBJECTIVE:  Note: Objective measures were completed at Evaluation unless otherwise noted.  PATIENT SURVEYS:  LEFS 7/80 3/11: 60/80  COGNITIVE STATUS: Within functional limits for tasks assessed   EDEMA:  Yes: moderate vs nonsurgical knee   GAIT: Eval: arrived in [facility]    Body Part #1 Knee  EVAL: ROM lacking 10-15 deg extension, did not measure flexion due to incision. Incision bleeding but no s/s of infection 3/11 -6-105 deg (115 flexion with overpressure)  TREATMENT DATE:   4/8: ROM beginning:  -5-124 IASTM gastroc Roller quads Standing TKE blue tband Heel raises edge of step, 5s stretch Lateral step up with slow lower Standing HSS on step  3/18: STM Lt hip Supine figure 4 stretch Seated hip hinge Fast/slow walking without AD IASTM & STM to gastroc & post knee- more walking & gastroc stretch  3/11: Heel slide with strap 5x5 breath hold Lateral step 4" Tandem, with head turns SLS STM to Rt hip Supine piriformis stretch  2/22: Changed bandages Long sitting quad set with towel roll Sidelying hip abduction 3x15 Sidelying hip flexion/extension Seated hamstring stretch- able to achieve -14 deg Nu step 6 min Walking stairs    PATIENT EDUCATION:  Education details: Teacher, music of condition, POC, HEP, exercise form/rationale Person educated: Patient and Spouse Education method: Explanation, Demonstration, Actor cues, Verbal cues, and Handouts Education comprehension: verbalized understanding, returned demonstration, verbal cues required, tactile cues required, and needs further education  HOME EXERCISE PROGRAM:  [email or web address]  Access Code: [email or web address]   ASSESSMENT:  CLINICAL IMPRESSION: Tightness in lateral gastroc creating edema accumulation in popliteal space and ankle soreness. Reported feeling better after session today. Will focus heat pad to gastroc belly rather than ankle and utilize roller to quads/gastroc on a more regular basis. Will use DN on Sat if necessary.      REHAB POTENTIAL: Good  CLINICAL DECISION MAKING: Stable/uncomplicated  EVALUATION COMPLEXITY: Low   GOALS: Goals reviewed with patient? Yes  SHORT TERM GOALS: Target date: 3/13  ROM 0-120 Baseline: lacking <5 deg Goal status: partially met  2.  Independent ambulation Baseline:  Goal status: achieved    LONG TERM GOALS: Target date: POC date  Navigate stairs step over step Baseline:  Goal status: INITIAL  2.  LEFS to 85% of max score Baseline:  Goal  status: INITIAL  3.  Able to participate with grand kids with little to no limitation by knee Baseline:  Goal status: INITIAL  4.  Knee ext strength 90% of opposite LE Baseline:  Goal status: INITIAL    PLAN:  PT FREQUENCY: 1-2x/week  PT DURATION: POC date  PLANNED INTERVENTIONS: 97164- PT Re-evaluation, 97110-Therapeutic exercises, 97530- Therapeutic activity, 97112- Neuromuscular re-education, 97535- Self Care, 16109- Manual therapy, [phone]- Gait training, [phone]- Aquatic Therapy, [phone]- Electrical stimulation (unattended), 97016- Vasopneumatic device, Patient/Family education, Balance training, Stair training, Taping, Dry Needling, Joint mobilization, Spinal mobilization, Scar mobilization, Cryotherapy, and Moist heat.  PLAN FOR NEXT SESSION: ROM, edema management, STM gastroc, CKC hip activation & balance Raea Magallon C. Erika Slaby PT, DPT 02/15/24 8:43 AM

## 2024-02-16 DIAGNOSIS — G4733 Obstructive sleep apnea (adult) (pediatric): Secondary | ICD-10-CM | POA: Diagnosis not present

## 2024-02-19 ENCOUNTER — Ambulatory Visit (HOSPITAL_BASED_OUTPATIENT_CLINIC_OR_DEPARTMENT_OTHER): Payer: Commercial Managed Care - PPO | Admitting: Physical Therapy

## 2024-02-21 ENCOUNTER — Other Ambulatory Visit: Payer: Self-pay | Admitting: Emergency Medicine

## 2024-02-21 MED ORDER — DULOXETINE HCL 30 MG PO CPEP
30.0000 mg | ORAL_CAPSULE | Freq: Two times a day (BID) | ORAL | 3 refills | Status: AC
  Filled 2024-02-21: qty 180, 90d supply, fill #0
  Filled 2024-06-06: qty 180, 90d supply, fill #1
  Filled 2024-09-19: qty 180, 90d supply, fill #2

## 2024-02-22 ENCOUNTER — Ambulatory Visit (HOSPITAL_BASED_OUTPATIENT_CLINIC_OR_DEPARTMENT_OTHER): Payer: Commercial Managed Care - PPO | Admitting: Physical Therapy

## 2024-02-22 ENCOUNTER — Other Ambulatory Visit (HOSPITAL_COMMUNITY): Payer: Self-pay

## 2024-02-24 ENCOUNTER — Encounter (HOSPITAL_BASED_OUTPATIENT_CLINIC_OR_DEPARTMENT_OTHER): Payer: Self-pay

## 2024-02-24 ENCOUNTER — Ambulatory Visit (HOSPITAL_BASED_OUTPATIENT_CLINIC_OR_DEPARTMENT_OTHER): Payer: Commercial Managed Care - PPO

## 2024-02-24 DIAGNOSIS — M25562 Pain in left knee: Secondary | ICD-10-CM

## 2024-02-24 DIAGNOSIS — R262 Difficulty in walking, not elsewhere classified: Secondary | ICD-10-CM

## 2024-02-24 DIAGNOSIS — M25662 Stiffness of left knee, not elsewhere classified: Secondary | ICD-10-CM

## 2024-02-24 NOTE — Therapy (Signed)
 OUTPATIENT PHYSICAL THERAPY TREATMENT   Patient Name: Patricia Mathews MRN: [id number] DOB:[birth year], 63 y.o., female Today's Date: 02/24/2024  END OF SESSION:  PT End of Session - 02/24/24 0843     Visit Number 6    Number of Visits 13    Date for PT Re-Evaluation 02/05/24    Authorization Type [facility] [employer]    PT Start Time [phone]    PT Stop Time 0929    PT Time Calculation (min) 40 min    Activity Tolerance Patient tolerated treatment well    Behavior During Therapy [facility] for tasks assessed/performed               Past Medical History:  Diagnosis Date   Dyspnea    Dysrhythmia    Endometrial polyp    Family history of adverse reaction to anesthesia    Patient states her mother had an adverse reaction to anesthesia but she is not sure exactly what it was   Fibromyalgia    GAD (generalized anxiety disorder)    History of panic attacks    Hypertension    followed by pcp   Lung cancer [facility])    & surgery   Medical history non-contributory    Neuropathy    OSA on CPAP    followed by dr dohmeier--- per study 02-02-2018 moderate to severe osa   Right bundle branch block    Sjogren's disease (HCC)    Systemic lupus (HCC)    rheumonotologist-- dr Fontaine No   Past Surgical History:  Procedure Laterality Date   CESAREAN SECTION  1986;  1989   CHONDROPLASTY Left 12/09/2021   Procedure: LEFT KNEE MEDIAL ROOT REPAIR, FEMORAL CHONDROPLASTY;  Surgeon: Huel Cote, MD;  Location: [facility];  Service: Orthopedics;  Laterality: Left;  general with regional block   COLONOSCOPY WITH PROPOFOL  10/2012   DILATATION & CURETTAGE/HYSTEROSCOPY WITH MYOSURE N/A 11/05/2020   Procedure: DILATATION & CURETTAGE/HYSTEROSCOPY WITH MYOSURE;  Surgeon: Genia Del, MD;  Location: [facility];  Service: Gynecology;  Laterality: N/A;  request to follow in [place] [facility] block time ~10:00am requests one hour   FIDUCIAL MARKER PLACEMENT  09/11/2021    Procedure: FIDUCIAL DYE MARKING;  Surgeon: Josephine Igo, DO;  [facility];  Service: Pulmonary;;   INTERCOSTAL NERVE BLOCK Right 03/26/2021   Procedure: INTERCOSTAL NERVE BLOCK;  Surgeon: Corliss Skains, MD;  [facility];  Service: Thoracic;  Laterality: Right;   INTERCOSTAL NERVE BLOCK Right 09/11/2021   Procedure: INTERCOSTAL NERVE BLOCK;  Surgeon: Corliss Skains, MD;  [facility];  Service: Thoracic;  Laterality: Right;   LUNG SURGERY     twice, robotic   LYMPH NODE DISSECTION  03/26/2021   Procedure: LYMPH NODE DISSECTION;  Surgeon: Corliss Skains, MD;  [facility];  Service: Thoracic;;   LYMPH NODE DISSECTION Right 09/11/2021   Procedure: LYMPH NODE DISSECTION;  Surgeon: Corliss Skains, MD;  [facility];  Service: Thoracic;  Laterality: Right;   thorascopy Right    Robotic Assisted   VIDEO BRONCHOSCOPY WITH ENDOBRONCHIAL NAVIGATION N/A 09/11/2021   Procedure: VIDEO BRONCHOSCOPY WITH ENDOBRONCHIAL NAVIGATION;  Surgeon: Josephine Igo, DO;  [facility];  Service: Pulmonary;  Laterality: N/A;   Patient Active Problem List   Diagnosis Date Noted   Acute medial meniscus tear of right knee    Plica of knee, left    Dyslipidemia 10/24/2021   Hypertension 10/24/2021   Neuropathic pain 10/24/2021  S/P partial lobectomy of lung 09/11/2021   Carcinoid tumor    Malignant carcinoid tumor of the bronchus and lung (HCC) 04/18/2021   Drug induced constipation 04/18/2021   Right lower lobe pulmonary nodule 03/26/2021   Essential hypertension, benign 12/25/2019   Other forms of systemic lupus erythematosus (HCC) 12/25/2019   Sjogren's syndrome (HCC) 12/25/2019   GAD (generalized anxiety disorder) 12/25/2019   Obstructive sleep apnea treated with continuous positive airway pressure (CPAP) 09/01/2018   Sleep related headaches 01/03/2018   Snoring 01/03/2018   Hypersomnia with sleep apnea 01/03/2018   Arthralgia 01/03/2018    Multiple neurological symptoms 12/28/2017      REFERRING PROVIDER:  Wilhelmenia Harada, MD     REFERRING DIAG:  [phone] (ICD-10-CM) - Acute medial meniscal tear, left, initial encounter    s/o Lt uniarthro 2/13  Rationale for Evaluation and Treatment: Rehabilitation  THERAPY DIAG:  Acute pain of left knee  Stiffness of left knee, not elsewhere classified  Difficulty in walking, not elsewhere classified  ONSET DATE: DOS 2/13   SUBJECTIVE:                                                                                                                                                                                           SUBJECTIVE STATEMENT: Pt reports she cancelled last appt due to pulling her back. She is improving with climbing stairs, but still having trouble going downstairs. No pain at entry.   PERTINENT HISTORY:  See PMH  PAIN:  Are you having pain? No  PRECAUTIONS:  None  RED FLAGS: None   WEIGHT BEARING RESTRICTIONS:  No  FALLS:  Has patient fallen in last 6 months? No  LIVING ENVIRONMENT: Lives with: lives with their spouse 2- story home but has full bath on main floor  PLOF:  Independent  PATIENT GOALS:  Back to normal   OBJECTIVE:  Note: Objective measures were completed at Evaluation unless otherwise noted.  PATIENT SURVEYS:  LEFS 7/80 3/11: 60/80 4/17:63/80  COGNITIVE STATUS: Within functional limits for tasks assessed   EDEMA:  Yes: moderate vs nonsurgical knee   GAIT: Eval: arrived in [facility]    Body Part #1 Knee  EVAL: ROM lacking 10-15 deg extension, did not measure flexion due to incision. Incision bleeding but no s/s of infection 3/11 -6-105 deg (115 flexion with overpressure)  TREATMENT DATE:    4/16 4-122 LEFS 63 PROM L knee STM to distal HS Long sit HSS 30sec x3 LAQ 3# 5" 2x15 Squats  2x10 (cues for form) Gastroc stretch of step Step ups bottom stair x15 Stair climbing x5 steps x3 Eccentric reach from bottom stair x10 Standing hip 3 way 2x10 ea RTB at ankles    4/8: ROM beginning: -5-124 IASTM gastroc Roller quads Standing TKE blue tband Heel raises edge of step, 5s stretch Lateral step up with slow lower Standing HSS on step  3/18: STM Lt hip Supine figure 4 stretch Seated hip hinge Fast/slow walking without AD IASTM & STM to gastroc & post knee- more walking & gastroc stretch  3/11: Heel slide with strap 5x5 breath hold Lateral step 4" Tandem, with head turns SLS STM to Rt hip Supine piriformis stretch  2/22: Changed bandages Long sitting quad set with towel roll Sidelying hip abduction 3x15 Sidelying hip flexion/extension Seated hamstring stretch- able to achieve -14 deg Nu step 6 min Walking stairs    PATIENT EDUCATION:  Education details: Teacher, music of condition, POC, HEP, exercise form/rationale Person educated: Patient and Spouse Education method: Explanation, Demonstration, Actor cues, Verbal cues, and Handouts Education comprehension: verbalized understanding, returned demonstration, verbal cues required, tactile cues required, and needs further education  HOME EXERCISE PROGRAM:  [email or web address]  Access Code: [email or web address]   ASSESSMENT:  CLINICAL IMPRESSION: Pt with good tolerance for LE strengthening. Felt good stretch with long sit HSS and gastroc stretch off step, though still remains limited in knee extension by 4 degrees. Improved by 3 points of LEFS today. With stair climbing, she was able to complete reciprocal pattern, though felt some difficulty with descending stairs. Worked on eccentric reaching from bottom step with good tolerance, though difficult. Will continue to progress strengthening and ROM as tolerated in clinic.      REHAB POTENTIAL: Good  CLINICAL DECISION MAKING: Stable/uncomplicated  EVALUATION  COMPLEXITY: Low   GOALS: Goals reviewed with patient? Yes  SHORT TERM GOALS: Target date: 3/13  ROM 0-120 Baseline: lacking <5 deg Goal status: partially met  2.  Independent ambulation Baseline:  Goal status: achieved    LONG TERM GOALS: Target date: POC date  Navigate stairs step over step Baseline:  Goal status: INITIAL  2.  LEFS to 85% of max score Baseline:  Goal status: INITIAL  3.  Able to participate with grand kids with little to no limitation by knee Baseline:  Goal status: INITIAL  4.  Knee ext strength 90% of opposite LE Baseline:  Goal status: INITIAL    PLAN:  PT FREQUENCY: 1-2x/week  PT DURATION: POC date  PLANNED INTERVENTIONS: 97164- PT Re-evaluation, 97110-Therapeutic exercises, 97530- Therapeutic activity, 97112- Neuromuscular re-education, 97535- Self Care, 16109- Manual therapy, [phone]- Gait training, [phone]- Aquatic Therapy, [phone]- Electrical stimulation (unattended), 97016- Vasopneumatic device, Patient/Family education, Balance training, Stair training, Taping, Dry Needling, Joint mobilization, Spinal mobilization, Scar mobilization, Cryotherapy, and Moist heat.  PLAN FOR NEXT SESSION: ROM, edema management, STM gastroc, CKC hip activation & balance Herb Loges, PTA  02/24/24 10:09 AM

## 2024-02-29 ENCOUNTER — Ambulatory Visit (HOSPITAL_BASED_OUTPATIENT_CLINIC_OR_DEPARTMENT_OTHER): Payer: Commercial Managed Care - PPO | Admitting: Physical Therapy

## 2024-02-29 ENCOUNTER — Encounter (HOSPITAL_BASED_OUTPATIENT_CLINIC_OR_DEPARTMENT_OTHER): Payer: Self-pay | Admitting: Physical Therapy

## 2024-02-29 DIAGNOSIS — R262 Difficulty in walking, not elsewhere classified: Secondary | ICD-10-CM

## 2024-02-29 DIAGNOSIS — M25562 Pain in left knee: Secondary | ICD-10-CM | POA: Diagnosis not present

## 2024-02-29 DIAGNOSIS — M25662 Stiffness of left knee, not elsewhere classified: Secondary | ICD-10-CM | POA: Diagnosis not present

## 2024-02-29 NOTE — Addendum Note (Signed)
 Addended by: Hassel Lins on: 02/29/2024 08:43 AM   Modules accepted: Orders

## 2024-02-29 NOTE — Therapy (Addendum)
 OUTPATIENT PHYSICAL THERAPY TREATMENT   Patient Name: Patricia Mathews MRN: [id number] DOB:1961/10/13, 63 y.o., female Today's Date: 02/29/2024  END OF SESSION:  PT End of Session - 02/29/24 0751     Visit Number 7    Number of Visits 13    Date for PT Re-Evaluation 04/09/24    Authorization Type [facility] [employer]    PT Start Time [phone]    PT Stop Time 0840    PT Time Calculation (min) 43 min    Activity Tolerance Patient tolerated treatment well    Behavior During Therapy [facility] for tasks assessed/performed               Past Medical History:  Diagnosis Date   Dyspnea    Dysrhythmia    Endometrial polyp    Family history of adverse reaction to anesthesia    Patient states her mother had an adverse reaction to anesthesia but she is not sure exactly what it was   Fibromyalgia    GAD (generalized anxiety disorder)    History of panic attacks    Hypertension    followed by pcp   Lung cancer [facility])    & surgery   Medical history non-contributory    Neuropathy    OSA on CPAP    followed by dr dohmeier--- per study 02-02-2018 moderate to severe osa   Right bundle branch block    Sjogren's disease (HCC)    Systemic lupus (HCC)    rheumonotologist-- dr Lum [place]   Past Surgical History:  Procedure Laterality Date   CESAREAN SECTION  1986;  1989   CHONDROPLASTY Left 12/09/2021   Procedure: LEFT KNEE MEDIAL ROOT REPAIR, FEMORAL CHONDROPLASTY;  Surgeon: Wilhelmenia Harada, MD;  Location: [facility];  Service: Orthopedics;  Laterality: Left;  general with regional block   COLONOSCOPY WITH PROPOFOL   10/2012   DILATATION & CURETTAGE/HYSTEROSCOPY WITH MYOSURE N/A 11/05/2020   Procedure: DILATATION & CURETTAGE/HYSTEROSCOPY WITH MYOSURE;  Surgeon: Lavoie, Marie-Lyne, MD;  Location: [facility];  Service: Gynecology;  Laterality: N/A;  request to follow in [place] [facility] block time ~10:00am requests one hour   FIDUCIAL MARKER PLACEMENT  09/11/2021    Procedure: FIDUCIAL DYE MARKING;  Surgeon: Prudy Brownie, DO;  [facility];  Service: Pulmonary;;   INTERCOSTAL NERVE BLOCK Right 03/26/2021   Procedure: INTERCOSTAL NERVE BLOCK;  Surgeon: Hilarie Lovely, MD;  [facility];  Service: Thoracic;  Laterality: Right;   INTERCOSTAL NERVE BLOCK Right 09/11/2021   Procedure: INTERCOSTAL NERVE BLOCK;  Surgeon: Hilarie Lovely, MD;  [facility];  Service: Thoracic;  Laterality: Right;   LUNG SURGERY     twice, robotic   LYMPH NODE DISSECTION  03/26/2021   Procedure: LYMPH NODE DISSECTION;  Surgeon: Hilarie Lovely, MD;  [facility];  Service: Thoracic;;   LYMPH NODE DISSECTION Right 09/11/2021   Procedure: LYMPH NODE DISSECTION;  Surgeon: Hilarie Lovely, MD;  [facility];  Service: Thoracic;  Laterality: Right;   thorascopy Right    Robotic Assisted   VIDEO BRONCHOSCOPY WITH ENDOBRONCHIAL NAVIGATION N/A 09/11/2021   Procedure: VIDEO BRONCHOSCOPY WITH ENDOBRONCHIAL NAVIGATION;  Surgeon: Prudy Brownie, DO;  [facility];  Service: Pulmonary;  Laterality: N/A;   Patient Active Problem List   Diagnosis Date Noted   Acute medial meniscus tear of right knee    Plica of knee, left    Dyslipidemia 10/24/2021   Hypertension 10/24/2021   Neuropathic pain 10/24/2021  S/P partial lobectomy of lung 09/11/2021   Carcinoid tumor    Malignant carcinoid tumor of the bronchus and lung (HCC) 04/18/2021   Drug induced constipation 04/18/2021   Right lower lobe pulmonary nodule 03/26/2021   Essential hypertension, benign 12/25/2019   Other forms of systemic lupus erythematosus (HCC) 12/25/2019   Sjogren's syndrome (HCC) 12/25/2019   GAD (generalized anxiety disorder) 12/25/2019   Obstructive sleep apnea treated with continuous positive airway pressure (CPAP) 09/01/2018   Sleep related headaches 01/03/2018   Snoring 01/03/2018   Hypersomnia with sleep apnea 01/03/2018   Arthralgia 01/03/2018    Multiple neurological symptoms 12/28/2017      REFERRING PROVIDER:  Wilhelmenia Harada, MD     REFERRING DIAG:  [phone] (ICD-10-CM) - Acute medial meniscal tear, left, initial encounter    s/o Lt uniarthro 2/13  Rationale for Evaluation and Treatment: Rehabilitation  THERAPY DIAG:  Acute pain of left knee  Stiffness of left knee, not elsewhere classified  Difficulty in walking, not elsewhere classified  ONSET DATE: DOS 2/13   SUBJECTIVE:                                                                                                                                                                                           SUBJECTIVE STATEMENT: Calf is still really tight. Last session was amazing.   PERTINENT HISTORY:  See PMH  PAIN:  Are you having pain? No  PRECAUTIONS:  None  RED FLAGS: None   WEIGHT BEARING RESTRICTIONS:  No  FALLS:  Has patient fallen in last 6 months? No  LIVING ENVIRONMENT: Lives with: lives with their spouse 2- story home but has full bath on main floor  PLOF:  Independent  PATIENT GOALS:  Back to normal   OBJECTIVE:  Note: Objective measures were completed at Evaluation unless otherwise noted.  PATIENT SURVEYS:  LEFS 7/80 3/11: 60/80 4/17:63/80  COGNITIVE STATUS: Within functional limits for tasks assessed   EDEMA:  Yes: moderate vs nonsurgical knee   GAIT: Eval: arrived in [facility]    Body Part #1 Knee  EVAL: ROM lacking 10-15 deg extension, did not measure flexion due to incision. Incision bleeding but no s/s of infection 3/11 -6-105 deg (115 flexion with overpressure)  TREATMENT DATE:   4/22: Dry needling Lt gastroc, subsequent session, verbalized authorization, twitch with decreased concordant pain, no ESTIM Gastroc stretch in doorway IASTM & rolling to incision site Active hamstring  stretch with hold in bend Stairs fwd& back step down Standing knee flexion/HS curl Retro stepping  4/16 4-122 LEFS 62 PROM L knee STM to distal HS Long sit HSS 30sec x3 LAQ 3# 5" 2x15 Squats 2x10 (cues for form) Gastroc stretch of step Step ups bottom stair x15 Stair climbing x5 steps x3 Eccentric reach from bottom stair x10 Standing hip 3 way 2x10 ea RTB at ankles    4/8: ROM beginning: -5-124 IASTM gastroc Roller quads Standing TKE blue tband Heel raises edge of step, 5s stretch Lateral step up with slow lower Standing HSS on step  3/18: STM Lt hip Supine figure 4 stretch Seated hip hinge Fast/slow walking without AD IASTM & STM to gastroc & post knee- more walking & gastroc stretch     PATIENT EDUCATION:  Education details: Anatomy of condition, POC, HEP, exercise form/rationale Person educated: Patient and Spouse Education method: Explanation, Demonstration, Tactile cues, Verbal cues, and Handouts Education comprehension: verbalized understanding, returned demonstration, verbal cues required, tactile cues required, and needs further education  HOME EXERCISE PROGRAM:  [email or web address]  Access Code: [email or web address]   ASSESSMENT:  CLINICAL IMPRESSION: Limited mobility at distal incision and creating push of patella and patellar tendon laterally. Good tolerance to DN with decreased tightness. Going to [place] this weekend and will keep working on HEP.      REHAB POTENTIAL: Good  CLINICAL DECISION MAKING: Stable/uncomplicated  EVALUATION COMPLEXITY: Low   GOALS: Goals reviewed with patient? Yes  SHORT TERM GOALS: Target date: 3/13  ROM 0-120 Baseline: lacking <5 deg Goal status: partially met  2.  Independent ambulation Baseline:  Goal status: achieved    LONG TERM GOALS: Target date: POC date  Navigate stairs step over step Baseline:  Goal status: INITIAL  2.  LEFS to 85% of max score Baseline:  Goal status: INITIAL  3.  Able to  participate with grand kids with little to no limitation by knee Baseline:  Goal status: INITIAL  4.  Knee ext strength 90% of opposite LE Baseline:  Goal status: INITIAL    PLAN:  PT FREQUENCY: 1-2x/week  PT DURATION: POC date  PLANNED INTERVENTIONS: 97164- PT Re-evaluation, 97110-Therapeutic exercises, 97530- Therapeutic activity, 97112- Neuromuscular re-education, 97535- Self Care, 16109- Manual therapy, [phone]- Gait training, [phone]- Aquatic Therapy, [phone]- Electrical stimulation (unattended), 97016- Vasopneumatic device, Patient/Family education, Balance training, Stair training, Taping, Dry Needling, Joint mobilization, Spinal mobilization, Scar mobilization, Cryotherapy, and Moist heat.  PLAN FOR NEXT SESSION: ROM, edema management, STM gastroc, CKC hip activation & balance Tammela Bales C. Keegen Heffern PT, DPT 02/29/24 8:41 AM

## 2024-03-02 ENCOUNTER — Encounter (HOSPITAL_BASED_OUTPATIENT_CLINIC_OR_DEPARTMENT_OTHER): Payer: Commercial Managed Care - PPO

## 2024-03-07 ENCOUNTER — Ambulatory Visit (HOSPITAL_BASED_OUTPATIENT_CLINIC_OR_DEPARTMENT_OTHER): Payer: Commercial Managed Care - PPO | Admitting: Physical Therapy

## 2024-03-09 ENCOUNTER — Ambulatory Visit (HOSPITAL_BASED_OUTPATIENT_CLINIC_OR_DEPARTMENT_OTHER): Payer: Commercial Managed Care - PPO | Attending: Orthopaedic Surgery

## 2024-03-09 ENCOUNTER — Encounter (HOSPITAL_BASED_OUTPATIENT_CLINIC_OR_DEPARTMENT_OTHER): Payer: Self-pay

## 2024-03-09 DIAGNOSIS — M25662 Stiffness of left knee, not elsewhere classified: Secondary | ICD-10-CM | POA: Insufficient documentation

## 2024-03-09 DIAGNOSIS — R262 Difficulty in walking, not elsewhere classified: Secondary | ICD-10-CM | POA: Diagnosis not present

## 2024-03-09 DIAGNOSIS — M25562 Pain in left knee: Secondary | ICD-10-CM | POA: Diagnosis not present

## 2024-03-09 NOTE — Therapy (Signed)
 OUTPATIENT PHYSICAL THERAPY TREATMENT   Patient Name: Patricia Mathews MRN: [id number] DOB:09/03/1961, 63 y.o., female Today's Date: 03/09/2024  END OF SESSION:  PT End of Session - 03/09/24 0922     Visit Number 8    Number of Visits 13    Date for PT Re-Evaluation 04/09/24    Authorization Type [facility] [employer]    PT Start Time 0845    PT Stop Time 0930    PT Time Calculation (min) 45 min    Activity Tolerance Patient tolerated treatment well    Behavior During Therapy WFL for tasks assessed/performed                Past Medical History:  Diagnosis Date   Dyspnea    Dysrhythmia    Endometrial polyp    Family history of adverse reaction to anesthesia    Patient states her mother had an adverse reaction to anesthesia but she is not sure exactly what it was   Fibromyalgia    GAD (generalized anxiety disorder)    History of panic attacks    Hypertension    followed by pcp   Lung cancer [facility])    & surgery   Medical history non-contributory    Neuropathy    OSA on CPAP    followed by dr dohmeier--- per study 02-02-2018 moderate to severe osa   Right bundle branch block    Sjogren's disease (HCC)    Systemic lupus (HCC)    rheumonotologist-- dr Lum [place]   Past Surgical History:  Procedure Laterality Date   CESAREAN SECTION  1986;  1989   CHONDROPLASTY Left 12/09/2021   Procedure: LEFT KNEE MEDIAL ROOT REPAIR, FEMORAL CHONDROPLASTY;  Surgeon: Wilhelmenia Harada, MD;  Location: [facility];  Service: Orthopedics;  Laterality: Left;  general with regional block   COLONOSCOPY WITH PROPOFOL   10/2012   DILATATION & CURETTAGE/HYSTEROSCOPY WITH MYOSURE N/A 11/05/2020   Procedure: DILATATION & CURETTAGE/HYSTEROSCOPY WITH MYOSURE;  Surgeon: Lavoie, Marie-Lyne, MD;  Location: [facility] ;  Service: Gynecology;  Laterality: N/A;  request to follow in [place] [facility] block time ~10:00am requests one hour   FIDUCIAL MARKER PLACEMENT  09/11/2021    Procedure: FIDUCIAL DYE MARKING;  Surgeon: Prudy Brownie, DO;  [facility];  Service: Pulmonary;;   INTERCOSTAL NERVE BLOCK Right 03/26/2021   Procedure: INTERCOSTAL NERVE BLOCK;  Surgeon: Hilarie Lovely, MD;  [facility];  Service: Thoracic;  Laterality: Right;   INTERCOSTAL NERVE BLOCK Right 09/11/2021   Procedure: INTERCOSTAL NERVE BLOCK;  Surgeon: Hilarie Lovely, MD;  [facility];  Service: Thoracic;  Laterality: Right;   LUNG SURGERY     twice, robotic   LYMPH NODE DISSECTION  03/26/2021   Procedure: LYMPH NODE DISSECTION;  Surgeon: Hilarie Lovely, MD;  [facility];  Service: Thoracic;;   LYMPH NODE DISSECTION Right 09/11/2021   Procedure: LYMPH NODE DISSECTION;  Surgeon: Hilarie Lovely, MD;  [facility];  Service: Thoracic;  Laterality: Right;   thorascopy Right    Robotic Assisted   VIDEO BRONCHOSCOPY WITH ENDOBRONCHIAL NAVIGATION N/A 09/11/2021   Procedure: VIDEO BRONCHOSCOPY WITH ENDOBRONCHIAL NAVIGATION;  Surgeon: Prudy Brownie, DO;  [facility];  Service: Pulmonary;  Laterality: N/A;   Patient Active Problem List   Diagnosis Date Noted   Acute medial meniscus tear of right knee    Plica of knee, left    Dyslipidemia 10/24/2021   Hypertension 10/24/2021   Neuropathic pain 10/24/2021  S/P partial lobectomy of lung 09/11/2021   Carcinoid tumor    Malignant carcinoid tumor of the bronchus and lung (HCC) 04/18/2021   Drug induced constipation 04/18/2021   Right lower lobe pulmonary nodule 03/26/2021   Essential hypertension, benign 12/25/2019   Other forms of systemic lupus erythematosus (HCC) 12/25/2019   Sjogren's syndrome (HCC) 12/25/2019   GAD (generalized anxiety disorder) 12/25/2019   Obstructive sleep apnea treated with continuous positive airway pressure (CPAP) 09/01/2018   Sleep related headaches 01/03/2018   Snoring 01/03/2018   Hypersomnia with sleep apnea 01/03/2018   Arthralgia 01/03/2018    Multiple neurological symptoms 12/28/2017      REFERRING PROVIDER:  Wilhelmenia Harada, MD     REFERRING DIAG:  [phone] (ICD-10-CM) - Acute medial meniscal tear, left, initial encounter    s/o Lt uniarthro 2/13  Rationale for Evaluation and Treatment: Rehabilitation  THERAPY DIAG:  Acute pain of left knee  Stiffness of left knee, not elsewhere classified  Difficulty in walking, not elsewhere classified  ONSET DATE: DOS 2/13   SUBJECTIVE:                                                                                                                                                                                           SUBJECTIVE STATEMENT: Calf is still really tight. Last session was amazing.   PERTINENT HISTORY:  See PMH  PAIN:  Are you having pain? No  PRECAUTIONS:  None  RED FLAGS: None   WEIGHT BEARING RESTRICTIONS:  No  FALLS:  Has patient fallen in last 6 months? No  LIVING ENVIRONMENT: Lives with: lives with their spouse 2- story home but has full bath on main floor  PLOF:  Independent  PATIENT GOALS:  Back to normal   OBJECTIVE:  Note: Objective measures were completed at Evaluation unless otherwise noted.  PATIENT SURVEYS:  LEFS 7/80 3/11: 60/80 4/17:63/80  COGNITIVE STATUS: Within functional limits for tasks assessed   EDEMA:  Yes: moderate vs nonsurgical knee   GAIT: Eval: arrived in [facility]    Body Part #1 Knee  EVAL: ROM lacking 10-15 deg extension, did not measure flexion due to incision. Incision bleeding but no s/s of infection 3/11 -6-105 deg (115 flexion with overpressure)  TREATMENT DATE:    5/1 Sci fit bike  Standing gastroc stretch 30sec x3 Long sit HSS 30sec x3 bil PROM L knee Prone IASTM to calf with roller Supine SLR 3x10 Step ups 6" step 2x10 Eccentric step down fwd 4"  2x10ea Lateral step ups 6" x10 Reverse lunges at rail 2x10 L anterior Leg press 70lbs x20, 90lbs x20     4/22: Dry needling Lt gastroc, subsequent session, verbalized authorization, twitch with decreased concordant pain, no ESTIM Gastroc stretch in doorway IASTM & rolling to incision site Active hamstring stretch with hold in bend Stairs fwd& back step down Standing knee flexion/HS curl Retro stepping  4/16 4-122 LEFS 63 PROM L knee STM to distal HS Long sit HSS 30sec x3 LAQ 3# 5" 2x15 Squats 2x10 (cues for form) Gastroc stretch of step Step ups bottom stair x15 Stair climbing x5 steps x3 Eccentric reach from bottom stair x10 Standing hip 3 way 2x10 ea RTB at ankles    4/8: ROM beginning: -5-124 IASTM gastroc Roller quads Standing TKE blue tband Heel raises edge of step, 5s stretch Lateral step up with slow lower Standing HSS on step  3/18: STM Lt hip Supine figure 4 stretch Seated hip hinge Fast/slow walking without AD IASTM & STM to gastroc & post knee- more walking & gastroc stretch     PATIENT EDUCATION:  Education details: Anatomy of condition, POC, HEP, exercise form/rationale Person educated: Patient and Spouse Education method: Explanation, Demonstration, Tactile cues, Verbal cues, and Handouts Education comprehension: verbalized understanding, returned demonstration, verbal cues required, tactile cues required, and needs further education  HOME EXERCISE PROGRAM:  [email or web address]  Access Code: [email or web address]   ASSESSMENT:  CLINICAL IMPRESSION: Good tolerance for IASTM to gastroc mm to decrease tightness here. Very tender reported with this. Significant tightness noted with HS stretching and standing gastroc stretching. She denied discomfort with step ups, but was challenged by eccentric fwd step downs from 4"" step however. Worked on leg press today as pt would like to begin doing this at the gym. No complaints with this. Instructed pt to  continue working on HS and gastroc stretching at home.    REHAB POTENTIAL: Good  CLINICAL DECISION MAKING: Stable/uncomplicated  EVALUATION COMPLEXITY: Low   GOALS: Goals reviewed with patient? Yes  SHORT TERM GOALS: Target date: 3/13  ROM 0-120 Baseline: lacking <5 deg Goal status: partially met  2.  Independent ambulation Baseline:  Goal status: achieved    LONG TERM GOALS: Target date: POC date  Navigate stairs step over step Baseline:  Goal status: INITIAL  2.  LEFS to 85% of max score Baseline:  Goal status: INITIAL  3.  Able to participate with grand kids with little to no limitation by knee Baseline:  Goal status: INITIAL  4.  Knee ext strength 90% of opposite LE Baseline:  Goal status: INITIAL    PLAN:  PT FREQUENCY: 1-2x/week  PT DURATION: POC date  PLANNED INTERVENTIONS: 97164- PT Re-evaluation, 97110-Therapeutic exercises, 97530- Therapeutic activity, 97112- Neuromuscular re-education, 97535- Self Care, 16109- Manual therapy, [phone]- Gait training, [phone]- Aquatic Therapy, [phone]- Electrical stimulation (unattended), 97016- Vasopneumatic device, Patient/Family education, Balance training, Stair training, Taping, Dry Needling, Joint mobilization, Spinal mobilization, Scar mobilization, Cryotherapy, and Moist heat.  PLAN FOR NEXT SESSION: ROM, edema management, STM gastroc, CKC hip activation & balance Herb Loges, PTA  03/09/24 10:03 AM

## 2024-03-15 ENCOUNTER — Other Ambulatory Visit: Payer: Self-pay

## 2024-03-17 ENCOUNTER — Other Ambulatory Visit (HOSPITAL_COMMUNITY): Payer: Self-pay

## 2024-03-17 DIAGNOSIS — G4733 Obstructive sleep apnea (adult) (pediatric): Secondary | ICD-10-CM | POA: Diagnosis not present

## 2024-04-08 ENCOUNTER — Other Ambulatory Visit: Payer: Self-pay | Admitting: Emergency Medicine

## 2024-04-08 MED ORDER — PREDNISONE 20 MG PO TABS: 40.0000 mg | ORAL_TABLET | Freq: Every day | ORAL | 3 refills | Status: AC

## 2024-04-18 ENCOUNTER — Other Ambulatory Visit: Payer: Self-pay | Admitting: Emergency Medicine

## 2024-04-18 MED ORDER — DOXYCYCLINE HYCLATE 100 MG PO TABS: 100.0000 mg | ORAL_TABLET | Freq: Two times a day (BID) | ORAL | 0 refills | Status: AC

## 2024-05-03 ENCOUNTER — Other Ambulatory Visit: Payer: Self-pay | Admitting: Emergency Medicine

## 2024-05-04 ENCOUNTER — Other Ambulatory Visit (HOSPITAL_COMMUNITY): Payer: Self-pay

## 2024-05-04 MED ORDER — ALPRAZOLAM 0.25 MG PO TABS
0.2500 mg | ORAL_TABLET | Freq: Two times a day (BID) | ORAL | 3 refills | Status: DC | PRN
  Filled 2024-05-04: qty 30, 15d supply, fill #0
  Filled 2024-06-06: qty 30, 15d supply, fill #1
  Filled 2024-08-22: qty 30, 15d supply, fill #2
  Filled 2024-09-19: qty 30, 15d supply, fill #3

## 2024-05-19 ENCOUNTER — Ambulatory Visit (HOSPITAL_BASED_OUTPATIENT_CLINIC_OR_DEPARTMENT_OTHER): Admitting: Orthopaedic Surgery

## 2024-05-19 ENCOUNTER — Ambulatory Visit (HOSPITAL_BASED_OUTPATIENT_CLINIC_OR_DEPARTMENT_OTHER)

## 2024-05-19 ENCOUNTER — Telehealth (HOSPITAL_BASED_OUTPATIENT_CLINIC_OR_DEPARTMENT_OTHER): Payer: Self-pay | Admitting: Orthopaedic Surgery

## 2024-05-19 DIAGNOSIS — S83242A Other tear of medial meniscus, current injury, left knee, initial encounter: Secondary | ICD-10-CM

## 2024-05-19 DIAGNOSIS — M25562 Pain in left knee: Secondary | ICD-10-CM | POA: Diagnosis not present

## 2024-05-19 DIAGNOSIS — Z96652 Presence of left artificial knee joint: Secondary | ICD-10-CM | POA: Diagnosis not present

## 2024-05-19 DIAGNOSIS — M7989 Other specified soft tissue disorders: Secondary | ICD-10-CM | POA: Diagnosis not present

## 2024-05-19 NOTE — Progress Notes (Signed)
 Post Operative Evaluation    Procedure/Date of Surgery: Left knee medial unicompartmental knee arthroplasty 2/13  Interval History:     Presents today for follow-up of the below procedure.  She is experiencing more swelling at today's visit.  She does state that she has had several flareups of recurrences of swelling and pain.  She is having hard time placing weight on the knee   PMH/PSH/Family History/Social History/Meds/Allergies:    Past Medical History:  Diagnosis Date   Dyspnea    Dysrhythmia    Endometrial polyp    Family history of adverse reaction to anesthesia    Patient states her mother had an adverse reaction to anesthesia but she is not sure exactly what it was   Fibromyalgia    GAD (generalized anxiety disorder)    History of panic attacks    Hypertension    followed by pcp   Lung cancer [facility])    & surgery   Medical history non-contributory    Neuropathy    OSA on CPAP    followed by dr dohmeier--- per study 02-02-2018 moderate to severe osa   Right bundle branch block    Sjogren's disease (HCC)    Systemic lupus (HCC)    rheumonotologist-- dr ronal jacob   Past Surgical History:  Procedure Laterality Date   CESAREAN SECTION  1986;  1989   CHONDROPLASTY Left 12/09/2021   Procedure: LEFT KNEE MEDIAL ROOT REPAIR, FEMORAL CHONDROPLASTY;  Surgeon: Genelle Standing, MD;  Location:  S[facility]ERY [facility];  Service: Orthopedics;  Laterality: Left;  general with regional block   COLONOSCOPY WITH PROPOFOL   10/2012   DILATATION & CURETTAGE/HYSTEROSCOPY WITH MYOSURE N/A 11/05/2020   Procedure: DILATATION & CURETTAGE/HYSTEROSCOPY WITH MYOSURE;  Surgeon: Lavoie, Marie-Lyne, MD;  Location: [facility];  Service: Gynecology;  Laterality: N/A;  request to follow in [place] [facility] block time ~10:00am requests one hour   FIDUCIAL MARKER PLACEMENT  09/11/2021   Procedure: FIDUCIAL DYE MARKING;  Surgeon: Brenna Adine CROME,  DO;  [facility];  Service: Pulmonary;;   INTERCOSTAL NERVE BLOCK Right 03/26/2021   Procedure: INTERCOSTAL NERVE BLOCK;  Surgeon: Shyrl Linnie KIDD, MD;  [facility];  Service: Thoracic;  Laterality: Right;   INTERCOSTAL NERVE BLOCK Right 09/11/2021   Procedure: INTERCOSTAL NERVE BLOCK;  Surgeon: Shyrl Linnie KIDD, MD;  [facility];  Service: Thoracic;  Laterality: Right;   LUNG SURGERY     twice, robotic   LYMPH NODE DISSECTION  03/26/2021   Procedure: LYMPH NODE DISSECTION;  Surgeon: Shyrl Linnie KIDD, MD;  [facility];  Service: Thoracic;;   LYMPH NODE DISSECTION Right 09/11/2021   Procedure: LYMPH NODE DISSECTION;  Surgeon: Shyrl Linnie KIDD, MD;  [facility];  Service: Thoracic;  Laterality: Right;   thorascopy Right    Robotic Assisted   VIDEO BRONCHOSCOPY WITH ENDOBRONCHIAL NAVIGATION N/A 09/11/2021   Procedure: VIDEO BRONCHOSCOPY WITH ENDOBRONCHIAL NAVIGATION;  Surgeon: Brenna Adine CROME, DO;  [facility];  Service: Pulmonary;  Laterality: N/A;   Social History   Socioeconomic History   Marital status: Married    Spouse name: Not on file   Number of children: 2   Years of education: post-grad   Highest education level: Not on file  Occupational History   Not on file  Tobacco Use  Smoking status: Former    Current packs/day: 0.00    Types: Cigarettes    Start date: 56    Quit date: 1989    Years since quitting: 36.5   Smokeless tobacco: Never  Vaping Use   Vaping status: Never Used  Substance and Sexual Activity   Alcohol use: Yes    Comment: occasional- social   Drug use: Never   Sexual activity: Yes    Partners: Male    Birth control/protection: Post-menopausal    Comment: 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17, PARTNERS- 3, MARRIED- 15 YRS   Other Topics Concern   Not on file  Social History Narrative   Lives at home with her husband   Right handed   2 cups of caffeine daily   Social Drivers of Research scientist (physical sciences) Strain: Not on file  Food Insecurity: Not on file  Transportation Needs: Not on file  Physical Activity: Not on file  Stress: Not on file  Social Connections: Not on file   Family History  Problem Relation Age of Onset   Hypertension Mother    Osteoarthritis Mother    Cancer Father        PROSTATE   Hypertension Father    Heart failure Father    Lung disease Father    Breast cancer Sister    Hypertension Sister    Lupus Sister    Sarcoidosis Paternal Aunt    Cancer Brother        PROSTATE   Lupus Brother    Hypertension Brother    Hypertension Brother    Hypertension Brother    Allergies  Allergen Reactions   Penicillins Anaphylaxis   Current Outpatient Medications  Medication Sig Dispense Refill   Albuterol -Budesonide (AIRSUPRA ) 90-80 MCG/ACT AERO Inhale 2 puffs into the lungs every 6 (six) hours as needed. 5.9 g 1   ALPRAZolam  (XANAX ) 0.25 MG tablet Take 1 tablet (0.25 mg total) by mouth 2 (two) times daily as needed for anxiety. 30 tablet 3   DULoxetine  (CYMBALTA ) 30 MG capsule Take 1 capsule (30 mg total) by mouth 2 (two) times daily. 180 capsule 3   losartan -hydrochlorothiazide  (HYZAAR ) 50-12.5 MG tablet Take 1 tablet by mouth daily. 90 tablet 3   MELATONIN PO Take 1 capsule by mouth at bedtime as needed (sleep).     ondansetron  (ZOFRAN -ODT) 4 MG disintegrating tablet Take 1 tablet (4 mg total) by mouth every 8 (eight) hours as needed for nausea or vomiting. 20 tablet 2   rosuvastatin  (CRESTOR ) 10 MG tablet Take 1 tablet (10 mg total) by mouth daily. 90 tablet 3   tamsulosin  (FLOMAX ) 0.4 MG CAPS capsule Take 1 capsule (0.4 mg total) by mouth daily. 90 capsule 3   VITAMIN D  PO Take by mouth.     No current facility-administered medications for this visit.   No results found.  Review of Systems:   A ROS was performed including pertinent positives and negatives as documented in the HPI.   Musculoskeletal Exam:    There were no vitals taken for this  visit.  Left knee incision is well-appearing without erythema or drainage.  Range of motion is from 5 degrees to 100 degrees without pain.  Distal neurosensory exam is intact minimal swelling  Imaging:    3 views left knee: Status post medial unicompartmental knee arthroplasty without evidence of complication  I personally reviewed and interpreted the radiographs.   Assessment:   63 year old female who is 5 months status post left unicompartmental knee  arthroplasty.  Today's visit I did discuss that I do believe that her knee is having somewhat of a reactive type of effusion.  She does have a history of lupus and I do believe this may be inflammatory related.  That being said given the fact that it is is painful I have recommended left knee aspiration that we can send this for cell count and synovial crystal analysis.  I did describe that there does appear to be inflammation and should this all come back negative and not concerning for infection that likely I will plan for a left knee injection with Zilretta . Plan :    - Return to clinic for left knee injection pending negative aspiration results      I personally saw and evaluated the patient, and participated in the management and treatment plan.  Elspeth Parker, MD Attending Physician, Orthopedic Surgery  This document was dictated using Dragon voice recognition software. A reasonable attempt at proof reading has been made to minimize errors.

## 2024-05-19 NOTE — Telephone Encounter (Signed)
 zilretta

## 2024-05-21 ENCOUNTER — Other Ambulatory Visit: Payer: Self-pay | Admitting: Emergency Medicine

## 2024-05-21 MED ORDER — PREDNISONE 20 MG PO TABS: 20.0000 mg | ORAL_TABLET | Freq: Two times a day (BID) | ORAL | 2 refills | Status: AC

## 2024-05-21 MED ORDER — PREGABALIN 50 MG PO CAPS: 50.0000 mg | ORAL_CAPSULE | Freq: Two times a day (BID) | ORAL | 1 refills | Status: DC

## 2024-05-21 MED ORDER — ONDANSETRON 4 MG PO TBDP: 4.0000 mg | ORAL_TABLET | Freq: Three times a day (TID) | ORAL | 2 refills | Status: AC | PRN

## 2024-05-23 NOTE — Telephone Encounter (Signed)
VOB submitted for Zilretta, left knee  

## 2024-05-24 ENCOUNTER — Telehealth: Payer: Self-pay

## 2024-05-24 NOTE — Telephone Encounter (Signed)
 Faxed completed Pre-Determination form to [facility] for Zilretta , left knee to [phone]. Per [employer], turnaround time for pre-determination could last up to 15 days.

## 2024-05-30 ENCOUNTER — Telehealth: Payer: Self-pay

## 2024-05-30 ENCOUNTER — Telehealth (HOSPITAL_BASED_OUTPATIENT_CLINIC_OR_DEPARTMENT_OTHER): Payer: Self-pay | Admitting: Orthopaedic Surgery

## 2024-05-30 NOTE — Telephone Encounter (Signed)
 Left patient a message that they did not get approval for her injections to call the office back at [phone]

## 2024-05-30 NOTE — Telephone Encounter (Signed)
 Talked with Nica A.in provider services with [facility] and was advised that pre-determination is still pending and that it would take a little more time to be approved.  Pending document #[id number]

## 2024-05-31 ENCOUNTER — Ambulatory Visit (HOSPITAL_BASED_OUTPATIENT_CLINIC_OR_DEPARTMENT_OTHER): Admitting: Orthopaedic Surgery

## 2024-06-06 ENCOUNTER — Other Ambulatory Visit: Payer: Self-pay

## 2024-06-07 ENCOUNTER — Telehealth: Payer: Self-pay

## 2024-06-07 DIAGNOSIS — G8929 Other chronic pain: Secondary | ICD-10-CM

## 2024-06-07 NOTE — Telephone Encounter (Signed)
 Patient has been denied through her Surveyor, mining for Zilretta , left knee. Denial letter can be located under the referrals tab.

## 2024-06-08 ENCOUNTER — Other Ambulatory Visit: Payer: Self-pay | Admitting: Emergency Medicine

## 2024-06-08 MED ORDER — PREGABALIN 50 MG PO CAPS: 50.0000 mg | ORAL_CAPSULE | Freq: Two times a day (BID) | ORAL | 1 refills | Status: DC

## 2024-06-08 NOTE — Telephone Encounter (Signed)
 LMOM to schedule.

## 2024-06-09 ENCOUNTER — Other Ambulatory Visit: Payer: Self-pay | Admitting: Emergency Medicine

## 2024-06-09 ENCOUNTER — Other Ambulatory Visit (HOSPITAL_COMMUNITY): Payer: Self-pay

## 2024-06-09 MED ORDER — PREGABALIN 50 MG PO CAPS
50.0000 mg | ORAL_CAPSULE | Freq: Two times a day (BID) | ORAL | 2 refills | Status: AC
  Filled 2024-06-09: qty 20, 10d supply, fill #0
  Filled 2024-07-17: qty 20, 10d supply, fill #1

## 2024-06-20 ENCOUNTER — Encounter (HOSPITAL_BASED_OUTPATIENT_CLINIC_OR_DEPARTMENT_OTHER): Payer: Self-pay | Admitting: Orthopaedic Surgery

## 2024-06-23 ENCOUNTER — Ambulatory Visit (HOSPITAL_BASED_OUTPATIENT_CLINIC_OR_DEPARTMENT_OTHER): Admitting: Orthopaedic Surgery

## 2024-06-23 ENCOUNTER — Ambulatory Visit (INDEPENDENT_AMBULATORY_CARE_PROVIDER_SITE_OTHER): Admitting: Orthopaedic Surgery

## 2024-06-23 ENCOUNTER — Other Ambulatory Visit (HOSPITAL_BASED_OUTPATIENT_CLINIC_OR_DEPARTMENT_OTHER): Payer: Self-pay

## 2024-06-23 DIAGNOSIS — M25562 Pain in left knee: Secondary | ICD-10-CM

## 2024-06-23 DIAGNOSIS — G8929 Other chronic pain: Secondary | ICD-10-CM

## 2024-06-23 MED ORDER — TRIAMCINOLONE ACETONIDE 40 MG/ML IJ SUSP
80.0000 mg | INTRAMUSCULAR | Status: AC | PRN
  Administered 2024-06-23: 80 mg via INTRA_ARTICULAR

## 2024-06-23 MED ORDER — LIDOCAINE HCL 1 % IJ SOLN
4.0000 mL | INTRAMUSCULAR | Status: AC | PRN
  Administered 2024-06-23: 4 mL

## 2024-06-23 NOTE — Progress Notes (Signed)
 Post Operative Evaluation    Procedure/Date of Surgery: Left knee medial unicompartmental knee arthroplasty 2/13  Interval History:     Presents today for follow-up of the below procedure.  She is here today for Toradol injection.  PMH/PSH/Family History/Social History/Meds/Allergies:    Past Medical History:  Diagnosis Date  . Dyspnea   . Dysrhythmia   . Endometrial polyp   . Family history of adverse reaction to anesthesia    Patient states her mother had an adverse reaction to anesthesia but she is not sure exactly what it was  . Fibromyalgia   . GAD (generalized anxiety disorder)   . History of panic attacks   . Hypertension    followed by pcp  . Lung cancer [facility])    & surgery  . Medical history non-contributory   . Neuropathy   . OSA on CPAP    followed by dr dohmeier--- per study 02-02-2018 moderate to severe osa  . Right bundle branch block   . Sjogren's disease (HCC)   . Systemic lupus (HCC)    rheumonotologist-- dr ronal jacob   Past Surgical History:  Procedure Laterality Date  . CESAREAN SECTION  1986;  1989  . CHONDROPLASTY Left 12/09/2021   Procedure: LEFT KNEE MEDIAL ROOT REPAIR, FEMORAL CHONDROPLASTY;  Surgeon: Genelle Standing, MD;  Location: [facility];  Service: Orthopedics;  Laterality: Left;  general with regional block  . COLONOSCOPY WITH PROPOFOL  10/2012  . DILATATION & CURETTAGE/HYSTEROSCOPY WITH MYOSURE N/A 11/05/2020   Procedure: DILATATION & CURETTAGE/HYSTEROSCOPY WITH MYOSURE;  Surgeon: Lavoie, Marie-Lyne, MD;  Location: [facility];  Service: Gynecology;  Laterality: N/A;  request to follow in [place] [facility] block time ~10:00am requests one hour  . FIDUCIAL MARKER PLACEMENT  09/11/2021   Procedure: FIDUCIAL DYE MARKING;  Surgeon: Brenna Adine CROME, DO;  [facility]: [facility];  Service: Pulmonary;;  . INTERCOSTAL NERVE BLOCK Right 03/26/2021   Procedure: INTERCOSTAL NERVE BLOCK;   Surgeon: Shyrl Linnie KIDD, MD;  Location: [facility];  Service: Thoracic;  Laterality: Right;  . INTERCOSTAL NERVE BLOCK Right 09/11/2021   Procedure: INTERCOSTAL NERVE BLOCK;  Surgeon: Shyrl Linnie KIDD, MD;  Location: [facility];  Service: Thoracic;  Laterality: Right;  . LUNG SURGERY     twice, robotic  . LYMPH NODE DISSECTION  03/26/2021   Procedure: LYMPH NODE DISSECTION;  Surgeon: Shyrl Linnie KIDD, MD;  Location: [facility];  Service: Thoracic;;  . LYMPH NODE DISSECTION Right 09/11/2021   Procedure: LYMPH NODE DISSECTION;  Surgeon: Shyrl Linnie KIDD, MD;  Location: [facility];  Service: Thoracic;  Laterality: Right;  . thorascopy Right    Robotic Assisted  . VIDEO BRONCHOSCOPY WITH ENDOBRONCHIAL NAVIGATION N/A 09/11/2021   Procedure: VIDEO BRONCHOSCOPY WITH ENDOBRONCHIAL NAVIGATION;  Surgeon: Brenna Adine CROME, DO;  [facility]: [facility];  Service: Pulmonary;  Laterality: N/A;   Social History   Socioeconomic History  . Marital status: Married    Spouse name: Not on file  . Number of children: 2  . Years of education: post-grad  . Highest education level: Not on file  Occupational History  . Not on file  Tobacco Use  . Smoking status: Former    Current packs/day: 0.00    Types: Cigarettes    Start date: 89    Quit date: 1989  Years since quitting: 36.6  . Smokeless tobacco: Never  Vaping Use  . Vaping status: Never Used  Substance and Sexual Activity  . Alcohol use: Yes    Comment: occasional- social  . Drug use: Never  . Sexual activity: Yes    Partners: Male    Birth control/protection: Post-menopausal    Comment: 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17, PARTNERS- 3, MARRIED- 65 YRS   Other Topics Concern  . Not on file  Social History Narrative   Lives at home with her husband   Right handed   2 cups of caffeine daily   Social Drivers of Corporate investment banker Strain: Not on file  Food Insecurity: Not on file  Transportation Needs: Not on file  Physical Activity: Not on  file  Stress: Not on file  Social Connections: Not on file   Family History  Problem Relation Age of Onset  . Hypertension Mother   . Osteoarthritis Mother   . Cancer Father        PROSTATE  . Hypertension Father   . Heart failure Father   . Lung disease Father   . Breast cancer Sister   . Hypertension Sister   . Lupus Sister   . Sarcoidosis Paternal Aunt   . Cancer Brother        PROSTATE  . Lupus Brother   . Hypertension Brother   . Hypertension Brother   . Hypertension Brother    Allergies  Allergen Reactions  . Penicillins Anaphylaxis   Current Outpatient Medications  Medication Sig Dispense Refill  . Albuterol-Budesonide (AIRSUPRA) 90-80 MCG/ACT AERO Inhale 2 puffs into the lungs every 6 (six) hours as needed. 5.9 g 1  . ALPRAZolam (XANAX) 0.25 MG tablet Take 1 tablet (0.25 mg total) by mouth 2 (two) times daily as needed for anxiety. 30 tablet 3  . DULoxetine (CYMBALTA) 30 MG capsule Take 1 capsule (30 mg total) by mouth 2 (two) times daily. 180 capsule 3  . losartan-hydrochlorothiazide (HYZAAR) 50-12.5 MG tablet Take 1 tablet by mouth daily. 90 tablet 3  . MELATONIN PO Take 1 capsule by mouth at bedtime as needed (sleep).    . ondansetron (ZOFRAN-ODT) 4 MG disintegrating tablet Take 1 tablet (4 mg total) by mouth every 8 (eight) hours as needed for nausea or vomiting. 20 tablet 2  . pregabalin (LYRICA) 50 MG capsule Take 1 capsule (50 mg total) by mouth 2 (two) times daily for 10 days. 20 capsule 2  . rosuvastatin (CRESTOR) 10 MG tablet Take 1 tablet (10 mg total) by mouth daily. 90 tablet 3  . tamsulosin (FLOMAX) 0.4 MG CAPS capsule Take 1 capsule (0.4 mg total) by mouth daily. 90 capsule 3  . VITAMIN D PO Take by mouth.     No current facility-administered medications for this visit.   No results found.  Review of Systems:   A ROS was performed including pertinent positives and negatives as documented in the HPI.   Musculoskeletal Exam:    There were no  vitals taken for this visit.  Left knee incision is well-appearing without erythema or drainage.  Range of motion is from 5 degrees to 100 degrees without pain.  Distal neurosensory exam is intact minimal swelling  Imaging:    3 views left knee: Status post medial unicompartmental knee arthroplasty without evidence of complication  I personally reviewed and interpreted the radiographs.   Assessment:   63 year old female who is status post left unicompartmental knee arthroplasty.  Today's visit I did  discuss that I do believe that her knee is having somewhat of a reactive type of effusion.  Given this I have recommended Toradol injection. Plan :    - Left knee Toradol injection provided verbal consent obtained    Procedure Note  Patient: Patricia Mathews             Date of Birth: 1961-02-07           MRN: [id number]             Visit Date: 06/23/2024  Procedures: Visit Diagnoses:  1. Chronic pain of left knee     Large Joint Inj: L knee on 06/23/2024 11:49 AM Indications: pain Details: 22 G 1.5 in needle, ultrasound-guided anterior approach  Arthrogram: No  Medications: 4 mL lidocaine 1 %; 80 mg triamcinolone acetonide 40 MG/ML Outcome: tolerated well, no immediate complications Procedure, treatment alternatives, risks and benefits explained, specific risks discussed. Consent was given by the patient. Immediately prior to procedure a time out was called to verify the correct patient, procedure, equipment, support staff and site/side marked as required. Patient was prepped and draped in the usual sterile fashion.            I personally saw and evaluated the patient, and participated in the management and treatment plan.  Elspeth Parker, MD Attending Physician, Orthopedic Surgery  This document was dictated using Dragon voice recognition software. A reasonable attempt at proof reading has been made to minimize errors.

## 2024-07-06 ENCOUNTER — Other Ambulatory Visit (HOSPITAL_BASED_OUTPATIENT_CLINIC_OR_DEPARTMENT_OTHER): Payer: Self-pay | Admitting: Orthopaedic Surgery

## 2024-07-06 ENCOUNTER — Encounter (HOSPITAL_BASED_OUTPATIENT_CLINIC_OR_DEPARTMENT_OTHER): Payer: Self-pay | Admitting: Orthopaedic Surgery

## 2024-07-06 DIAGNOSIS — M79672 Pain in left foot: Secondary | ICD-10-CM

## 2024-07-17 ENCOUNTER — Other Ambulatory Visit: Payer: Self-pay

## 2024-07-29 DIAGNOSIS — G4733 Obstructive sleep apnea (adult) (pediatric): Secondary | ICD-10-CM | POA: Diagnosis not present

## 2024-08-04 ENCOUNTER — Ambulatory Visit (HOSPITAL_BASED_OUTPATIENT_CLINIC_OR_DEPARTMENT_OTHER): Payer: Self-pay | Admitting: Orthopaedic Surgery

## 2024-08-04 ENCOUNTER — Ambulatory Visit (HOSPITAL_BASED_OUTPATIENT_CLINIC_OR_DEPARTMENT_OTHER): Admitting: Orthopaedic Surgery

## 2024-08-04 ENCOUNTER — Other Ambulatory Visit (HOSPITAL_BASED_OUTPATIENT_CLINIC_OR_DEPARTMENT_OTHER): Payer: Self-pay

## 2024-08-04 DIAGNOSIS — M79672 Pain in left foot: Secondary | ICD-10-CM

## 2024-08-04 MED ORDER — OXYCODONE HCL 5 MG PO TABS
5.0000 mg | ORAL_TABLET | ORAL | 0 refills | Status: DC | PRN
  Filled 2024-08-04: qty 5, 1d supply, fill #0

## 2024-08-04 MED ORDER — IBUPROFEN 800 MG PO TABS
800.0000 mg | ORAL_TABLET | Freq: Three times a day (TID) | ORAL | 0 refills | Status: AC
  Filled 2024-08-04: qty 30, 10d supply, fill #0

## 2024-08-04 MED ORDER — ACETAMINOPHEN 500 MG PO TABS
500.0000 mg | ORAL_TABLET | Freq: Three times a day (TID) | ORAL | 0 refills | Status: AC
  Filled 2024-08-04: qty 30, 10d supply, fill #0

## 2024-08-04 NOTE — Progress Notes (Signed)
 Post Operative Evaluation    Procedure/Date of Surgery: Left knee medial unicompartmental knee arthroplasty 2/13  Interval History:     Presents today for follow-up of the below procedure.  She is overall doing much better after Toradol  injection.  She is only having some mild discomfort up and down stairs.  She does have a left dorsal ganglion cyst over the top of the foot which has been making shoewear very uncomfortable.  She has now had this aspirated twice  PMH/PSH/Family History/Social History/Meds/Allergies:    Past Medical History:  Diagnosis Date   Dyspnea    Dysrhythmia    Endometrial polyp    Family history of adverse reaction to anesthesia    Patient states her mother had an adverse reaction to anesthesia but she is not sure exactly what it was   Fibromyalgia    GAD (generalized anxiety disorder)    History of panic attacks    Hypertension    followed by pcp   Lung cancer [facility])    & surgery   Medical history non-contributory    Neuropathy    OSA on CPAP    followed by dr dohmeier--- per study 02-02-2018 moderate to severe osa   Right bundle branch block    Sjogren's disease    Systemic lupus (HCC)    rheumonotologist-- dr ronal jacob   Past Surgical History:  Procedure Laterality Date   CESAREAN SECTION  1986;  1989   CHONDROPLASTY Left 12/09/2021   Procedure: LEFT KNEE MEDIAL ROOT REPAIR, FEMORAL CHONDROPLASTY;  Surgeon: Genelle Standing, MD;  Location: [facility];  Service: Orthopedics;  Laterality: Left;  general with regional block   COLONOSCOPY WITH PROPOFOL   10/2012   DILATATION & CURETTAGE/HYSTEROSCOPY WITH MYOSURE N/A 11/05/2020   Procedure: DILATATION & CURETTAGE/HYSTEROSCOPY WITH MYOSURE;  Surgeon: Lavoie, Marie-Lyne, MD;  Location: [facility] [place];  Service: Gynecology;  Laterality: N/A;  request to follow in [place] [facility] block time ~10:00am requests one hour   FIDUCIAL MARKER PLACEMENT   09/11/2021   Procedure: FIDUCIAL DYE MARKING;  Surgeon: Brenna Adine CROME, DO;  [facility];  Service: Pulmonary;;   INTERCOSTAL NERVE BLOCK Right 03/26/2021   Procedure: INTERCOSTAL NERVE BLOCK;  Surgeon: Shyrl Linnie KIDD, MD;  [facility]: [facility];  Service: Thoracic;  Laterality: Right;   INTERCOSTAL NERVE BLOCK Right 09/11/2021   Procedure: INTERCOSTAL NERVE BLOCK;  Surgeon: Shyrl Linnie KIDD, MD;  Location: [facility];  Service: Thoracic;  Laterality: Right;   LUNG SURGERY     twice, robotic   LYMPH NODE DISSECTION  03/26/2021   Procedure: LYMPH NODE DISSECTION;  Surgeon: Shyrl Linnie KIDD, MD;  [facility];  Service: Thoracic;;   LYMPH NODE DISSECTION Right 09/11/2021   Procedure: LYMPH NODE DISSECTION;  Surgeon: Shyrl Linnie KIDD, MD;  [facility]: [facility];  Service: Thoracic;  Laterality: Right;   thorascopy Right    Robotic Assisted   VIDEO BRONCHOSCOPY WITH ENDOBRONCHIAL NAVIGATION N/A 09/11/2021   Procedure: VIDEO BRONCHOSCOPY WITH ENDOBRONCHIAL NAVIGATION;  Surgeon: Brenna Adine CROME, DO;  [facility];  Service: Pulmonary;  Laterality: N/A;   Social History   Socioeconomic History   Marital status: Married    Spouse name: Not on file   Number of children: 2   Years of education: post-grad   Highest education level: Not  on file  Occupational History   Not on file  Tobacco Use   Smoking status: Former    Current packs/day: 0.00    Types: Cigarettes    Start date: 65    Quit date: 1989    Years since quitting: 36.7   Smokeless tobacco: Never  Vaping Use   Vaping status: Never Used  Substance and Sexual Activity   Alcohol use: Yes    Comment: occasional- social   Drug use: Never   Sexual activity: Yes    Partners: Male    Birth control/protection: Post-menopausal    Comment: 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17, PARTNERS- 3, MARRIED- 13 YRS   Other Topics Concern   Not on file  Social History Narrative   Lives at home with her husband   Right handed    2 cups of caffeine daily   Social Drivers of Corporate investment banker Strain: Not on file  Food Insecurity: Not on file  Transportation Needs: Not on file  Physical Activity: Not on file  Stress: Not on file  Social Connections: Not on file   Family History  Problem Relation Age of Onset   Hypertension Mother    Osteoarthritis Mother    Cancer Father        PROSTATE   Hypertension Father    Heart failure Father    Lung disease Father    Breast cancer Sister    Hypertension Sister    Lupus Sister    Sarcoidosis Paternal Aunt    Cancer Brother        PROSTATE   Lupus Brother    Hypertension Brother    Hypertension Brother    Hypertension Brother    Allergies  Allergen Reactions   Penicillins Anaphylaxis   Current Outpatient Medications  Medication Sig Dispense Refill   acetaminophen  (TYLENOL ) 500 MG tablet Take 1 tablet (500 mg total) by mouth every 8 (eight) hours for 10 days. 30 tablet 0   ibuprofen  (ADVIL ) 800 MG tablet Take 1 tablet (800 mg total) by mouth every 8 (eight) hours for 10 days. Please take with food, please alternate with acetaminophen  30 tablet 0   oxyCODONE  (ROXICODONE ) 5 MG immediate release tablet Take 1 tablet (5 mg total) by mouth every 4 (four) hours as needed for severe pain (pain score 7-10) or breakthrough pain. 5 tablet 0   Albuterol -Budesonide (AIRSUPRA ) 90-80 MCG/ACT AERO Inhale 2 puffs into the lungs every 6 (six) hours as needed. 5.9 g 1   ALPRAZolam  (XANAX ) 0.25 MG tablet Take 1 tablet (0.25 mg total) by mouth 2 (two) times daily as needed for anxiety. 30 tablet 3   DULoxetine  (CYMBALTA ) 30 MG capsule Take 1 capsule (30 mg total) by mouth 2 (two) times daily. 180 capsule 3   losartan -hydrochlorothiazide  (HYZAAR ) 50-12.5 MG tablet Take 1 tablet by mouth daily. 90 tablet 3   MELATONIN PO Take 1 capsule by mouth at bedtime as needed (sleep).     ondansetron  (ZOFRAN -ODT) 4 MG disintegrating tablet Take 1 tablet (4 mg total) by mouth every 8  (eight) hours as needed for nausea or vomiting. 20 tablet 2   pregabalin  (LYRICA ) 50 MG capsule Take 1 capsule (50 mg total) by mouth 2 (two) times daily for 10 days. 20 capsule 2   rosuvastatin  (CRESTOR ) 10 MG tablet Take 1 tablet (10 mg total) by mouth daily. 90 tablet 3   tamsulosin  (FLOMAX ) 0.4 MG CAPS capsule Take 1 capsule (0.4 mg total) by mouth daily. 90 capsule 3  VITAMIN D  PO Take by mouth.     No current facility-administered medications for this visit.   No results found.  Review of Systems:   A ROS was performed including pertinent positives and negatives as documented in the HPI.   Musculoskeletal Exam:    There were no vitals taken for this visit.  Left knee incision is well-appearing without erythema or drainage.  Range of motion is from 5 degrees to 100 degrees without pain.  Distal neurosensory exam is intact minimal swelling  Left foot with approximately 1 x 1 cm dorsal ganglion about the dorsal aspect of the second metatarsal head.  This is firmly encapsulated and painful  Imaging:    3 views left knee: Status post medial unicompartmental knee arthroplasty without evidence of complication  I personally reviewed and interpreted the radiographs.   Assessment:   63 year old female with a left foot dorsal ganglion cyst.  This is causing her significant tenderness and it very difficult time with shoe wear.  Given this she would prefer to have this taken out as she has had it aspirated twice.  I do agree with this.  We will plan to proceed with left foot dorsal ganglion removal Plan :    - Plan for left foot dorsal ganglion cyst removal   After a lengthy discussion of treatment options, including risks, benefits, alternatives, complications of surgical and nonsurgical conservative options, the patient elected surgical repair.   The patient  is aware of the material risks  and complications including, but not limited to injury to adjacent structures, neurovascular  injury, infection, numbness, bleeding, implant failure, thermal burns, stiffness, persistent pain, failure to heal, disease transmission from allograft, need for further surgery, dislocation, anesthetic risks, blood clots, risks of death,and others. The probabilities of surgical success and failure discussed with patient given their particular co-morbidities.The time and nature of expected rehabilitation and recovery was discussed.The patient's questions were all answered preoperatively.  No barriers to understanding were noted. I explained the natural history of the disease process and Rx rationale.  I explained to the patient what I considered to be reasonable expectations given their personal situation.  The final treatment plan was arrived at through a shared patient decision making process model.        I personally saw and evaluated the patient, and participated in the management and treatment plan.  Elspeth Parker, MD Attending Physician, Orthopedic Surgery  This document was dictated using Dragon voice recognition software. A reasonable attempt at proof reading has been made to minimize errors.

## 2024-08-07 ENCOUNTER — Encounter (HOSPITAL_BASED_OUTPATIENT_CLINIC_OR_DEPARTMENT_OTHER): Payer: Self-pay | Admitting: Orthopaedic Surgery

## 2024-08-14 ENCOUNTER — Other Ambulatory Visit (HOSPITAL_BASED_OUTPATIENT_CLINIC_OR_DEPARTMENT_OTHER): Payer: Self-pay

## 2024-08-17 ENCOUNTER — Other Ambulatory Visit: Payer: Self-pay | Admitting: Emergency Medicine

## 2024-08-17 DIAGNOSIS — L089 Local infection of the skin and subcutaneous tissue, unspecified: Secondary | ICD-10-CM

## 2024-08-17 MED ORDER — CEFADROXIL 500 MG PO CAPS: 500.0000 mg | ORAL_CAPSULE | Freq: Two times a day (BID) | ORAL | 0 refills | Status: AC

## 2024-08-18 ENCOUNTER — Other Ambulatory Visit: Payer: Self-pay | Admitting: Emergency Medicine

## 2024-08-18 DIAGNOSIS — I10 Essential (primary) hypertension: Secondary | ICD-10-CM

## 2024-08-18 DIAGNOSIS — E785 Hyperlipidemia, unspecified: Secondary | ICD-10-CM | POA: Diagnosis not present

## 2024-08-18 DIAGNOSIS — C7A090 Malignant carcinoid tumor of the bronchus and lung: Secondary | ICD-10-CM

## 2024-08-18 DIAGNOSIS — M328 Other forms of systemic lupus erythematosus: Secondary | ICD-10-CM

## 2024-08-18 DIAGNOSIS — G4733 Obstructive sleep apnea (adult) (pediatric): Secondary | ICD-10-CM

## 2024-08-18 DIAGNOSIS — E559 Vitamin D deficiency, unspecified: Secondary | ICD-10-CM | POA: Diagnosis not present

## 2024-08-19 LAB — COMPREHENSIVE METABOLIC PANEL WITH GFR
ALT: 23 IU/L (ref 0–32)
AST: 24 IU/L (ref 0–40)
Albumin: 4.5 g/dL (ref 3.9–4.9)
Alkaline Phosphatase: 84 IU/L (ref 49–135)
BUN/Creatinine Ratio: 22 (ref 12–28)
BUN: 16 mg/dL (ref 8–27)
Bilirubin Total: 0.6 mg/dL (ref 0.0–1.2)
CO2: 22 mmol/L (ref 20–29)
Calcium: 9.5 mg/dL (ref 8.7–10.3)
Chloride: 101 mmol/L (ref 96–106)
Creatinine, Ser: 0.73 mg/dL (ref 0.57–1.00)
Globulin, Total: 2.7 g/dL (ref 1.5–4.5)
Glucose: 87 mg/dL (ref 70–99)
Potassium: 3.9 mmol/L (ref 3.5–5.2)
Sodium: 139 mmol/L (ref 134–144)
Total Protein: 7.2 g/dL (ref 6.0–8.5)
eGFR: 92 mL/min/1.73 (ref >59)

## 2024-08-19 LAB — CBC WITH DIFFERENTIAL/PLATELET
Basophils Absolute: 0.1 x10E3/uL (ref 0.0–0.2)
Basos: 1 %
EOS (ABSOLUTE): 0.2 x10E3/uL (ref 0.0–0.4)
Eos: 3 %
Hematocrit: 40.7 % (ref 34.0–46.6)
Hemoglobin: 13.3 g/dL (ref 11.1–15.9)
Immature Grans (Abs): 0 x10E3/uL (ref 0.0–0.1)
Immature Granulocytes: 0 %
Lymphocytes Absolute: 1.9 x10E3/uL (ref 0.7–3.1)
Lymphs: 33 %
MCH: 31.4 pg (ref 26.6–33.0)
MCHC: 32.7 g/dL (ref 31.5–35.7)
MCV: 96 fL (ref 79–97)
Monocytes Absolute: 0.5 x10E3/uL (ref 0.1–0.9)
Monocytes: 8 %
Neutrophils Absolute: 3.3 x10E3/uL (ref 1.4–7.0)
Neutrophils: 55 %
Platelets: 344 x10E3/uL (ref 150–450)
RBC: 4.24 x10E6/uL (ref 3.77–5.28)
RDW: 12.9 % (ref 11.7–15.4)
WBC: 5.9 x10E3/uL (ref 3.4–10.8)

## 2024-08-19 LAB — LIPID PANEL W/O CHOL/HDL RATIO
Cholesterol, Total: 177 mg/dL (ref 100–199)
HDL: 97 mg/dL (ref >39)
LDL Chol Calc (NIH): 65 mg/dL (ref 0–99)
Triglycerides: 81 mg/dL (ref 0–149)
VLDL Cholesterol Cal: 15 mg/dL (ref 5–40)

## 2024-08-19 LAB — CORTISOL: Cortisol: 7.5 ug/dL (ref 6.2–19.4)

## 2024-08-19 LAB — TSH: TSH: 1.45 u[IU]/mL (ref 0.450–4.500)

## 2024-08-19 LAB — HGB A1C W/O EAG: Hgb A1c MFr Bld: 5.4 % (ref 4.8–5.6)

## 2024-08-19 LAB — LIPASE: Lipase: 36 U/L (ref 14–72)

## 2024-08-19 LAB — VITAMIN B12: Vitamin B-12: 517 pg/mL (ref 232–1245)

## 2024-08-19 LAB — VITAMIN D 25 HYDROXY (VIT D DEFICIENCY, FRACTURES): Vit D, 25-Hydroxy: 49.2 ng/mL (ref 30.0–100.0)

## 2024-08-21 ENCOUNTER — Other Ambulatory Visit (HOSPITAL_COMMUNITY): Payer: Self-pay

## 2024-08-21 ENCOUNTER — Other Ambulatory Visit: Payer: Self-pay | Admitting: Emergency Medicine

## 2024-08-21 DIAGNOSIS — I10 Essential (primary) hypertension: Secondary | ICD-10-CM

## 2024-08-21 MED ORDER — LOSARTAN POTASSIUM-HCTZ 100-12.5 MG PO TABS
1.0000 | ORAL_TABLET | Freq: Every day | ORAL | 3 refills | Status: AC
  Filled 2024-08-21: qty 90, 90d supply, fill #0
  Filled 2024-11-19: qty 90, 90d supply, fill #1

## 2024-08-22 ENCOUNTER — Other Ambulatory Visit: Payer: Self-pay

## 2024-08-29 NOTE — Progress Notes (Unsigned)
 Patricia Mathews

## 2024-08-31 ENCOUNTER — Telehealth: Payer: Self-pay | Admitting: Orthopaedic Surgery

## 2024-08-31 ENCOUNTER — Telehealth (HOSPITAL_BASED_OUTPATIENT_CLINIC_OR_DEPARTMENT_OTHER): Payer: Self-pay | Admitting: Orthopaedic Surgery

## 2024-08-31 ENCOUNTER — Ambulatory Visit: Payer: Commercial Managed Care - PPO | Admitting: Neurology

## 2024-08-31 ENCOUNTER — Encounter (HOSPITAL_BASED_OUTPATIENT_CLINIC_OR_DEPARTMENT_OTHER): Payer: Self-pay | Admitting: Orthopaedic Surgery

## 2024-08-31 ENCOUNTER — Encounter: Payer: Self-pay | Admitting: Neurology

## 2024-08-31 VITALS — BP 126/79 | HR 67 | Resp 14 | Ht 65.0 in

## 2024-08-31 DIAGNOSIS — G4733 Obstructive sleep apnea (adult) (pediatric): Secondary | ICD-10-CM | POA: Diagnosis not present

## 2024-08-31 NOTE — Telephone Encounter (Signed)
 Called and confirmed with dental office they received fax

## 2024-08-31 NOTE — Telephone Encounter (Signed)
 Patient needs a clearance note for her dental office. Fax to   [place] at [employer] office [phone] (f) [phone] (P)  Patient is in office now.

## 2024-08-31 NOTE — Telephone Encounter (Signed)
 Randine from [facility] office called requesting a letter stating opt does not need pre meds before dental procedure. Please fax letter to [phone]. Dental Office number is [phone].

## 2024-08-31 NOTE — Telephone Encounter (Signed)
 After verbal discussion with Dr. Genelle, Note faxed and uploaded to MyCHart

## 2024-08-31 NOTE — Telephone Encounter (Signed)
 Note uploaded to MyChart and faxed to [phone]

## 2024-08-31 NOTE — Progress Notes (Signed)
 Patient: Patricia Mathews Date of Birth: [birth year]  Reason for Visit: Follow up History from: Patient Primary Neurologist: Dohmeier   ASSESSMENT AND PLAN 63 y.o. year old female   1.  OSA on CPAP (setup August 2024)  - Doing excellent with CPAP!  Excellent compliance! - Continue nightly usage minimum 4 hours - Continue current settings - Replace supplies routinely through DME - Follow-up in 1 year or sooner if needed  HISTORY OF PRESENT ILLNESS: Today 08/31/24 08/31/24 SS: Here for CPAP revisit, 100% compliance, 6-18 cm water, leak 6.0, AHI 4.6. last visit we increased her pressure range from 6-16 to 6-18 cm water for patient comfort. Likes the higher pressure. Loves her CPAP. Uses FFM. No issues. Here with her husband. No longer snores. Good energy during the day.   08/25/23 SS: Here for initial CPAP visit.  Prior CPAP user, consultation with Dr. Chalice in June 2024 to receive a new CPAP machine.  HST 05/18/2023 showed continued severe sleep apnea.  Calculated pAHI 30.2/H.  Doing excellent with new CPAP machine.  She loves it.  Goes to bed around 7 PM, sleeps till 6 AM.  100% compliance, 6-16 cm.  Leak 4.0, AHI 5.4.  Does think she may need more pressure. Using FFM.  ESS 2.  Official CPAP report is attached.  04/21/23 Dr. Chalice HISTORY:  Patricia Mathews is a 63 y.o. female patient, seen in referral from Dr. Ines for an evaluation of snoring and possible sleep apnea.   Dr. Ines is working her up for headaches with vertigo and nystagmus, stabbing pain sensation, feeling pain that radiates down her spine and occurs weekly. She was diagnosed with Lupus and Sjogren's syndrome over 10 years ago while living in [place] . Her husband, who is trained as an emergency room physician, has stated that she snores. She is post- menopausal, sleep quality was lower before she received hormone replacement therapy. The patient endorsed the Epworth Sleepiness Scale at 9/24 points and FSS at 26/63  points, GDS 2/ 15 points.  The patient's weight 194 pounds with a height of 65 (inches), resulting in a BMI of 32.3 kg/m2.The patient's neck circumference measured 14.5 inches.   Patricia Mathews had returned  for a CPAP titration study following a PSG from 02/02/2018. The study revealed an AHI of 27.9/h, REM AHI of 48.3, and SpO2 nadir of 79%. Severe PLMs were recorded.    The patient endorsed the Epworth Sleepiness Scale at 9/24 points and the Fatigue Score at 26 points.   The patient's weight 194 pounds with a height of 65 (inches), resulting in a BMI of 32.3 kg/m2. The patient's neck circumference measured 14.5 inches.   CURRENT MEDICATIONS: Xanax , Cymbalta , Prinivil , Prometrium    CPAP was initiated at 5 cmH20 with heated humidity per AASM split night standards and pressure was advanced to 14 cmH20 because of hypopneas, apneas and desaturations.  At a PAP pressure of 14 cmH20, there was a reduction of the AHI to 0.0 with improvement of the above symptoms of obstructive sleep apnea.     I have the pleasure of seeing Patricia Mathews 04/21/23 a right-handed female with an internal history of slow progressing lung cancer, several lung surgeries last 2023 carcinoid nodular tumor, , biopsies,  possible sleep disorder.  She has had one knee repair surgery 2023.   The patient had the first sleep study in the year 2019 with EDS- excessive daytime sleepiness.   with a result of an AHI ( Apnea Hypopnea index)  of OSA.    The patient sees my colleague Dr. Ines, who is working her up for headaches with vertigo and with established nystagmus.  She also has a stabbing pain sensation.  Is feeling pain that radiates down her spine and occurs weekly.  She has not identified triggers.  Her vertigo can also occur with and without a headache.  There is no family history of migraines.  She has noted up to 18 headache days a month.  Her husband, who is trained as an emergency room physician, has stated that she  snores significantly.     She has also suffered from extreme fatigue, sleepiness, malaise and she has had drop attacks.  She was diagnosed with lupus and Sjogren's syndrome over 10 years ago while living in [place] .  She moved from [place], [place]  about a year ago to the [place] area with her MD husband , who is practicing as a family Radio broadcast assistant.  She could not tolerate Plaquenil been first treated, prednisone  Tapers have made her migraines worse, but she uses low-dose estrogen which helps hot flashes and helps her sleep.  She had felt some relief with Cymbalta .  She also noted dry eyes and dry mouth- but this does not necessarily feel worse in the morning.   Sleep habits are as follows: Patricia Mathews reports that she may stay up watching TV until her bedtime around 10 PM if her husband goes to work next day.  If he does not have to go to work next day may actually watch Netflix up to 1 AM. She has no TV in the bedroom- she watches in her den.  Once in bed, she prays and goes promptly to sleep.  The patient's bedroom is cool, quiet and dark.  She has a bedroom with her husband, prefers to sleep on her right side and only uses one pillow.  The mattress is flat in a nonadjustable bed.  She usually is able to sleep through for several hours before she wakes,   often she does not even have a bathroom break at night. If she doesn't set her alarm and has not to get up in the morning she has no difficulties to sleep until 10 or 11 AM the next day.  She craves more sleep, wakes up achy but not tired.  Unfortunately she often has headaches in the morning this can be down the neck radiating sharp stabbing sensations or throbbing global headaches.  She usually is not nauseated, not dizzy.  Some headaches wake her out of sleep, sharp stabbing, migraine or cluster type.      Sleep medical history and family sleep history: no neck injury, normal cervical spine MRI. Brain MRI.  Brother with Sjoegrens, lupus and , 2  brothers and father with cancer ( prostate). Son had uveitis once while still in high school.  Social history:  [place] born Daughter of a cardiologist, married to Administrator, arts, parents living. 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   2 adult children with grandchildren,  She has been smoking until 27 years ago, 1992, while her daughter was in preschool.  Drinks wine, or whisky sour. 1-2 a week. Caffeine : coffee in AM and noon- 6  espressi doppio ! Soda - rarely,  Some times iced tea, no energy drinks.      Sleep habits are as follows: The patient's dinner time is between 5.30- PM. The patient goes to bed at 7.30 PM and continues to sleep for 7-8  hours, wakes for 2-3 bathroom breaks, the first time at 12 midnight, 2.30 AM and again 5 AM.  Daughter calls her at 7.30 AM.   The preferred sleep position is laterally, with the support of 1-2 pillows.  Dreams are reportedly frequent/vivid.   7.30 AM is the usual rise time. She reports feeling refreshed and restored in AM, without symptoms such as dry mouth, no longer having morning headaches, and no more residual fatigue on CPAP. Naps are taken very rarely, lasting from 15 to 40 minutes.  REVIEW OF SYSTEMS: Out of a complete 14 system review of symptoms, the patient complains only of the following symptoms, and all other reviewed systems are negative.  See HPI  ALLERGIES: Allergies  Allergen Reactions   Penicillins Anaphylaxis    HOME MEDICATIONS: Outpatient Medications Prior to Visit  Medication Sig Dispense Refill   Albuterol -Budesonide (AIRSUPRA ) 90-80 MCG/ACT AERO Inhale 2 puffs into the lungs every 6 (six) hours as needed. 5.9 g 1   ALPRAZolam  (XANAX ) 0.25 MG tablet Take 1 tablet (0.25 mg total) by mouth 2 (two) times daily as needed for anxiety. 30 tablet 3   cefadroxil (DURICEF) 500 MG capsule Take 1 capsule (500 mg total) by mouth 2 (two) times daily for 10 days. 20 capsule 0   DULoxetine  (CYMBALTA ) 30 MG  capsule Take 1 capsule (30 mg total) by mouth 2 (two) times daily. 180 capsule 3   losartan -hydrochlorothiazide  (HYZAAR ) 100-12.5 MG tablet Take 1 tablet by mouth daily. 90 tablet 3   MELATONIN PO Take 1 capsule by mouth at bedtime as needed (sleep).     ondansetron  (ZOFRAN -ODT) 4 MG disintegrating tablet Take 1 tablet (4 mg total) by mouth every 8 (eight) hours as needed for nausea or vomiting. 20 tablet 2   pregabalin  (LYRICA ) 50 MG capsule Take 1 capsule (50 mg total) by mouth 2 (two) times daily for 10 days. 20 capsule 2   rosuvastatin  (CRESTOR ) 10 MG tablet Take 1 tablet (10 mg total) by mouth daily. 90 tablet 3   VITAMIN D  PO Take by mouth.     oxyCODONE  (ROXICODONE ) 5 MG immediate release tablet Take 1 tablet (5 mg total) by mouth every 4 (four) hours as needed for severe pain (pain score 7-10) or breakthrough pain. 5 tablet 0   tamsulosin  (FLOMAX ) 0.4 MG CAPS capsule Take 1 capsule (0.4 mg total) by mouth daily. 90 capsule 3   No facility-administered medications prior to visit.    PAST MEDICAL HISTORY: Past Medical History:  Diagnosis Date   Dyspnea    Dysrhythmia    Endometrial polyp    Family history of adverse reaction to anesthesia    Patient states her mother had an adverse reaction to anesthesia but she is not sure exactly what it was   Fibromyalgia    GAD (generalized anxiety disorder)    History of panic attacks    Hypertension    followed by pcp   Lung cancer [facility])    & surgery   Medical history non-contributory    Neuropathy    OSA on CPAP    followed by dr dohmeier--- per study 02-02-2018 moderate to severe osa   Right bundle branch block    Sjogren's disease    Systemic lupus (HCC)    rheumonotologist-- dr aSABRA jacob    PAST SURGICAL HISTORY: Past Surgical History:  Procedure Laterality Date   CESAREAN SECTION  1986;  1989   CHONDROPLASTY Left 12/09/2021   Procedure: LEFT KNEE MEDIAL ROOT REPAIR,  FEMORAL CHONDROPLASTY;  Surgeon: Genelle Standing, MD;   Location: [facility];  Service: Orthopedics;  Laterality: Left;  general with regional block   COLONOSCOPY WITH PROPOFOL   10/2012   DILATATION & CURETTAGE/HYSTEROSCOPY WITH MYOSURE N/A 11/05/2020   Procedure: DILATATION & CURETTAGE/HYSTEROSCOPY WITH MYOSURE;  Surgeon: Lavoie, Marie-Lyne, MD;  Location: [facility] ;  Service: Gynecology;  Laterality: N/A;  request to follow in [place] [facility] block time ~10:00am requests one hour   FIDUCIAL MARKER PLACEMENT  09/11/2021   Procedure: FIDUCIAL DYE MARKING;  Surgeon: Brenna Adine CROME, DO;  Location: [facility];  Service: Pulmonary;;   INTERCOSTAL NERVE BLOCK Right 03/26/2021   Procedure: INTERCOSTAL NERVE BLOCK;  Surgeon: Shyrl Linnie KIDD, MD;  Location: [facility];  Service: Thoracic;  Laterality: Right;   INTERCOSTAL NERVE BLOCK Right 09/11/2021   Procedure: INTERCOSTAL NERVE BLOCK;  Surgeon: Shyrl Linnie KIDD, MD;  Location: [facility];  Service: Thoracic;  Laterality: Right;   LUNG SURGERY     twice, robotic   LYMPH NODE DISSECTION  03/26/2021   Procedure: LYMPH NODE DISSECTION;  Surgeon: Shyrl Linnie KIDD, MD;  Location: [facility];  Service: Thoracic;;   LYMPH NODE DISSECTION Right 09/11/2021   Procedure: LYMPH NODE DISSECTION;  Surgeon: Shyrl Linnie KIDD, MD;  Location: [facility];  Service: Thoracic;  Laterality: Right;   thorascopy Right    Robotic Assisted   VIDEO BRONCHOSCOPY WITH ENDOBRONCHIAL NAVIGATION N/A 09/11/2021   Procedure: VIDEO BRONCHOSCOPY WITH ENDOBRONCHIAL NAVIGATION;  Surgeon: Brenna Adine CROME, DO;  Location: [facility];  Service: Pulmonary;  Laterality: N/A;    FAMILY HISTORY: Family History  Problem Relation Age of Onset   Hypertension Mother    Osteoarthritis Mother    Cancer Father        PROSTATE   Hypertension Father    Heart failure Father    Lung disease Father    Breast cancer Sister    Hypertension Sister    Lupus Sister    Sarcoidosis Paternal Aunt    Cancer Brother         PROSTATE   Lupus Brother    Hypertension Brother    Hypertension Brother    Hypertension Brother     SOCIAL HISTORY: Social History   Socioeconomic History   Marital status: Married    Spouse name: Not on file   Number of children: 2   Years of education: post-grad   Highest education level: Not on file  Occupational History   Not on file  Tobacco Use   Smoking status: Former    Current packs/day: 0.00    Types: Cigarettes    Start date: 1979    Quit date: 1989    Years since quitting: 36.8   Smokeless tobacco: Never  Vaping Use   Vaping status: Never Used  Substance and Sexual Activity   Alcohol use: Yes    Comment: occasional- social   Drug use: Never   Sexual activity: Yes    Partners: Male    Birth control/protection: Post-menopausal    Comment: 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17, PARTNERS- 3, MARRIED- 56 YRS   Other Topics Concern   Not on file  Social History Narrative   Lives at home with her husband   Right handed   2 cups of caffeine daily   Social Drivers of Corporate investment banker Strain: Not on file  Food Insecurity: Not on file  Transportation Needs: Not on file  Physical Activity: Not on file  Stress: Not on file  Social Connections: Not on file  Intimate Partner Violence: Not on file    PHYSICAL EXAM  Vitals:   08/31/24 1306  BP: 126/79  Pulse: 67  Resp: 14  SpO2: 96%  Height: 5' 5 (1.651 m)    Body mass index is 32.12 kg/m.  Generalized: Well developed, in no acute distress  Neurological examination  Mentation: Alert oriented to time, place, history taking. Follows all commands speech and language fluent Motor: Moves all extremities independent Gait and station: Gait is normal.   DIAGNOSTIC DATA (LABS, IMAGING, TESTING) - I reviewed patient records, labs, notes, testing and imaging myself where available.  Lab Results  Component Value Date   WBC 5.9 08/18/2024   HGB 13.3 08/18/2024   HCT 40.7 08/18/2024   MCV 96 08/18/2024    PLT 344 08/18/2024      Component Value Date/Time   NA 139 08/18/2024 0855   K 3.9 08/18/2024 0855   CL 101 08/18/2024 0855   CO2 22 08/18/2024 0855   GLUCOSE 87 08/18/2024 0855   GLUCOSE 119 (H) 05/01/2022 0918   BUN 16 08/18/2024 0855   CREATININE 0.73 08/18/2024 0855   CREATININE 0.90 05/01/2022 0918   CALCIUM  9.5 08/18/2024 0855   PROT 7.2 08/18/2024 0855   ALBUMIN 4.5 08/18/2024 0855   AST 24 08/18/2024 0855   AST 18 05/01/2022 0918   ALT 23 08/18/2024 0855   ALT 24 05/01/2022 0918   ALKPHOS 84 08/18/2024 0855   BILITOT 0.6 08/18/2024 0855   BILITOT 0.5 05/01/2022 0918   GFRNONAA >60 05/01/2022 0918   GFRAA 104 10/11/2020 0834   GFRAA >60 01/26/2018 1245   Lab Results  Component Value Date   CHOL 177 08/18/2024   HDL 97 08/18/2024   LDLCALC 65 08/18/2024   TRIG 81 08/18/2024   Lab Results  Component Value Date   HGBA1C 5.4 08/18/2024   Lab Results  Component Value Date   VITAMINB12 517 08/18/2024   Lab Results  Component Value Date   TSH 1.450 08/18/2024    Lauraine Born, AGNP-C, DNP 08/31/2024, 1:08 PM [facility] [address] [place], [place] [address] [phone]

## 2024-08-31 NOTE — Patient Instructions (Signed)
 Great to see you today! Continue CPAP usage minimum 4 hours nightly Continue current settings Continue to replace supplies routinely through DME Follow-up in 1 year or sooner if needed. Thanks!!

## 2024-09-11 ENCOUNTER — Encounter: Payer: Self-pay | Admitting: Radiology

## 2024-09-19 ENCOUNTER — Other Ambulatory Visit: Payer: Self-pay

## 2024-10-13 ENCOUNTER — Other Ambulatory Visit: Payer: Self-pay | Admitting: Podiatry

## 2024-10-13 ENCOUNTER — Ambulatory Visit

## 2024-10-13 ENCOUNTER — Ambulatory Visit: Admitting: Podiatry

## 2024-10-13 DIAGNOSIS — M6740 Ganglion, unspecified site: Secondary | ICD-10-CM | POA: Diagnosis not present

## 2024-10-13 DIAGNOSIS — Z01818 Encounter for other preprocedural examination: Secondary | ICD-10-CM | POA: Diagnosis not present

## 2024-10-13 DIAGNOSIS — L723 Sebaceous cyst: Secondary | ICD-10-CM

## 2024-10-13 DIAGNOSIS — M67472 Ganglion, left ankle and foot: Secondary | ICD-10-CM | POA: Diagnosis not present

## 2024-10-13 NOTE — Progress Notes (Unsigned)
 Subjective:  Patient ID: Patricia Mathews, female    DOB: February 01, 1961,  MRN: [id number]  Chief Complaint  Patient presents with   Cyst    Pt stated that she has this place on the top of her foot her husband has drained it twice but it keeps coming back    63 y.o. female presents with the above complaint.  Patient presents with left dorsal midfoot ganglion cyst with underlying superficial arthritic changes.  She states that she has had it drained past.  Husband is a ER physician and has drained it in the past.  Wanted to get it evaluated and has not seen anyone else prior to seeing me.  Would like to discuss treatment options for this she has had it drained 2 times.  It keeps coming back.  She wants to discuss more aggressive intervention she has failed all conservative care listed above.  Including shoe gear modification padding offloading she wishes to undergo surgical intervention.   Review of Systems: Negative except as noted in the HPI. Denies N/V/F/Ch.  Past Medical History:  Diagnosis Date   Dyspnea    Dysrhythmia    Endometrial polyp    Family history of adverse reaction to anesthesia    Patient states her mother had an adverse reaction to anesthesia but she is not sure exactly what it was   Fibromyalgia    GAD (generalized anxiety disorder)    History of panic attacks    Hypertension    followed by pcp   Lung cancer [facility])    & surgery   Medical history non-contributory    Neuropathy    OSA on CPAP    followed by dr dohmeier--- per study 02-02-2018 moderate to severe osa   Right bundle branch block    Sjogren's disease    Systemic lupus (HCC)    rheumonotologist-- dr aSABRA jacob   Current Medications[1]  Tobacco Use History[2]  Allergies[3] Objective:  There were no vitals filed for this visit. There is no height or weight on file to calculate BMI. Constitutional Well developed. Well nourished.  Vascular Dorsalis pedis pulses palpable bilaterally. Posterior  tibial pulses palpable bilaterally. Capillary refill normal to all digits.  No cyanosis or clubbing noted. Pedal hair growth normal.  Neurologic Normal speech. Oriented to person, place, and time. Epicritic sensation to light touch grossly present bilaterally.  Dermatologic Left dorsal midfoot ganglion cyst noted mobile positive transilluminates.  Single lobulated underlying clinically able to appreciate some arthritic changes  Orthopedic: Normal joint ROM without pain or crepitus bilaterally. No visible deformities. No bony tenderness.   Radiographs: 3 views of skeletally mature adult left foot: Midfoot arthritis noted increasing soft tissue density and volume noted to the dorsal midfoot.  No other abnormalities identified.  Plantar posterior heel spurring noted Assessment:   1. Ganglion cyst   2. Encounter for preoperative examination for general surgical procedure    Plan:  Patient was evaluated and treated and all questions answered.  Left dorsal midfoot ganglion cyst - All questions and concerns were discussed with the patient in extensive detail given the amount of pain that she is having in setting of failing all conservative care including multiple aspiration I believe she would benefit from surgical excision of the cyst I discussed this with the patient in extensive detail I discussed my preoperative entire postop plan with the patient extensive detail she states understanding would like to proceed with surgery -Informed surgical risk consent was reviewed and read aloud to the  patient.  I reviewed the films.  I have discussed my findings with the patient in great detail.  I have discussed all risks including but not limited to infection, stiffness, scarring, limp, disability, deformity, damage to blood vessels and nerves, numbness, poor healing, need for braces, arthritis, chronic pain, amputation, death.  All benefits and realistic expectations discussed in great detail.  I have made  no promises as to the outcome.  I have provided realistic expectations.  I have offered the patient a 2nd opinion, which they have declined and assured me they preferred to proceed despite the risks   No follow-ups on file.    [1]  Current Outpatient Medications:    Albuterol -Budesonide (AIRSUPRA ) 90-80 MCG/ACT AERO, Inhale 2 puffs into the lungs every 6 (six) hours as needed., Disp: 5.9 g, Rfl: 1   ALPRAZolam  (XANAX ) 0.25 MG tablet, Take 1 tablet (0.25 mg total) by mouth 2 (two) times daily as needed for anxiety., Disp: 30 tablet, Rfl: 3   cefadroxil  (DURICEF) 500 MG capsule, Take 1 capsule (500 mg total) by mouth 2 (two) times daily for 10 days., Disp: 20 capsule, Rfl: 0   DULoxetine  (CYMBALTA ) 30 MG capsule, Take 1 capsule (30 mg total) by mouth 2 (two) times daily., Disp: 180 capsule, Rfl: 3   losartan -hydrochlorothiazide  (HYZAAR ) 100-12.5 MG tablet, Take 1 tablet by mouth daily., Disp: 90 tablet, Rfl: 3   MELATONIN PO, Take 1 capsule by mouth at bedtime as needed (sleep)., Disp: , Rfl:    ondansetron  (ZOFRAN -ODT) 4 MG disintegrating tablet, Take 1 tablet (4 mg total) by mouth every 8 (eight) hours as needed for nausea or vomiting., Disp: 20 tablet, Rfl: 2   pregabalin  (LYRICA ) 50 MG capsule, Take 1 capsule (50 mg total) by mouth 2 (two) times daily for 10 days., Disp: 20 capsule, Rfl: 2   rosuvastatin  (CRESTOR ) 10 MG tablet, Take 1 tablet (10 mg total) by mouth daily., Disp: 90 tablet, Rfl: 3   VITAMIN D  PO, Take by mouth., Disp: , Rfl:  [2]  Social History Tobacco Use  Smoking Status Former   Current packs/day: 0.00   Types: Cigarettes   Start date: 51   Quit date: 1989   Years since quitting: 36.9  Smokeless Tobacco Never  [3]  Allergies Allergen Reactions   Penicillins Anaphylaxis

## 2024-10-17 ENCOUNTER — Other Ambulatory Visit: Payer: Self-pay | Admitting: Thoracic Surgery (Cardiothoracic Vascular Surgery)

## 2024-10-17 DIAGNOSIS — D3A090 Benign carcinoid tumor of the bronchus and lung: Secondary | ICD-10-CM

## 2024-10-23 ENCOUNTER — Other Ambulatory Visit: Payer: Self-pay | Admitting: Emergency Medicine

## 2024-10-24 ENCOUNTER — Other Ambulatory Visit (HOSPITAL_COMMUNITY): Payer: Self-pay

## 2024-10-24 MED ORDER — ALPRAZOLAM 0.25 MG PO TABS
0.2500 mg | ORAL_TABLET | Freq: Two times a day (BID) | ORAL | 3 refills | Status: AC | PRN
  Filled 2024-10-24: qty 30, 15d supply, fill #0

## 2024-10-27 ENCOUNTER — Ambulatory Visit (HOSPITAL_COMMUNITY)
Admission: RE | Admit: 2024-10-27 | Discharge: 2024-10-27 | Disposition: A | Discharge status: Home / Self Care | Source: Ambulatory Visit | Attending: Cardiovascular Disease | Admitting: Cardiovascular Disease

## 2024-10-27 DIAGNOSIS — D3A090 Benign carcinoid tumor of the bronchus and lung: Secondary | ICD-10-CM | POA: Insufficient documentation

## 2024-10-27 DIAGNOSIS — G4733 Obstructive sleep apnea (adult) (pediatric): Secondary | ICD-10-CM | POA: Diagnosis not present

## 2024-10-27 DIAGNOSIS — R918 Other nonspecific abnormal finding of lung field: Secondary | ICD-10-CM | POA: Diagnosis not present

## 2024-11-13 ENCOUNTER — Telehealth: Payer: Self-pay | Admitting: Podiatry

## 2024-11-13 NOTE — Telephone Encounter (Signed)
 The Patient is scheduled for surgery on 12/11/24. The patient is not on Blood Thinners. The patient is on GLP! Medication and has been advised on when to discontinue use before surgery, The pharmacy listed in chart is correct.

## 2024-11-17 ENCOUNTER — Ambulatory Visit: Admitting: Thoracic Surgery (Cardiothoracic Vascular Surgery)

## 2024-11-27 ENCOUNTER — Telehealth: Payer: Self-pay | Admitting: Podiatry

## 2024-11-27 NOTE — Telephone Encounter (Signed)
 DOS- 12/11/2024  EXCISE GANGLION/TUMOR LT- 28090  [employer] EFFECTIVE DATE- 11/09/2022  DEDUCTIBLE- $600 REMAINING- $600 OOP- $7900 REMAINING- $7885 FAMILY DEDUCTIBLE- $1200 REMAINING- $1200 FAMILY OOP- $15800 REMAINING- $84214 COINSURANCE- 30%  PER AVAILITY PORTAL, PRIOR AUTH IS NOT REQUIRED FOR CPT CODE 71909.

## 2024-11-30 ENCOUNTER — Encounter: Payer: Self-pay | Admitting: Podiatry

## 2024-12-01 ENCOUNTER — Ambulatory Visit: Admitting: Thoracic Surgery (Cardiothoracic Vascular Surgery)

## 2024-12-20 ENCOUNTER — Encounter: Admitting: Podiatry

## 2024-12-20 ENCOUNTER — Encounter

## 2025-01-02 ENCOUNTER — Ambulatory Visit: Admitting: Obstetrics and Gynecology

## 2025-01-03 ENCOUNTER — Encounter

## 2025-01-03 ENCOUNTER — Encounter: Admitting: Podiatry

## 2025-09-06 ENCOUNTER — Ambulatory Visit: Admitting: Neurology
